# Patient Record
Sex: Female | Born: 1945 | Race: White | Hispanic: No | Marital: Married | Smoking: Never smoker
Health system: Southern US, Community
[De-identification: ages and names within clinical notes are randomized; demographics above are authoritative.]

## PROBLEM LIST (undated history)

## (undated) DIAGNOSIS — Z7960 Long term (current) use of unspecified immunomodulators and immunosuppressants: Secondary | ICD-10-CM

## (undated) DIAGNOSIS — E559 Vitamin D deficiency, unspecified: Secondary | ICD-10-CM

## (undated) DIAGNOSIS — I495 Sick sinus syndrome: Secondary | ICD-10-CM

## (undated) DIAGNOSIS — K219 Gastro-esophageal reflux disease without esophagitis: Secondary | ICD-10-CM

## (undated) DIAGNOSIS — M1990 Unspecified osteoarthritis, unspecified site: Secondary | ICD-10-CM

## (undated) DIAGNOSIS — E538 Deficiency of other specified B group vitamins: Secondary | ICD-10-CM

## (undated) DIAGNOSIS — Z79899 Other long term (current) drug therapy: Secondary | ICD-10-CM

## (undated) DIAGNOSIS — I447 Left bundle-branch block, unspecified: Secondary | ICD-10-CM

## (undated) DIAGNOSIS — M349 Systemic sclerosis, unspecified: Secondary | ICD-10-CM

## (undated) DIAGNOSIS — I350 Nonrheumatic aortic (valve) stenosis: Secondary | ICD-10-CM

## (undated) DIAGNOSIS — E039 Hypothyroidism, unspecified: Secondary | ICD-10-CM

## (undated) DIAGNOSIS — R011 Cardiac murmur, unspecified: Secondary | ICD-10-CM

## (undated) DIAGNOSIS — I272 Pulmonary hypertension, unspecified: Secondary | ICD-10-CM

## (undated) DIAGNOSIS — I7300 Raynaud's syndrome without gangrene: Secondary | ICD-10-CM

## (undated) DIAGNOSIS — I499 Cardiac arrhythmia, unspecified: Secondary | ICD-10-CM

## (undated) DIAGNOSIS — D126 Benign neoplasm of colon, unspecified: Secondary | ICD-10-CM

## (undated) DIAGNOSIS — I10 Essential (primary) hypertension: Secondary | ICD-10-CM

## (undated) DIAGNOSIS — D649 Anemia, unspecified: Secondary | ICD-10-CM

## (undated) DIAGNOSIS — Z950 Presence of cardiac pacemaker: Secondary | ICD-10-CM

## (undated) HISTORY — PX: ABDOMINAL HYSTERECTOMY: SHX81

---

## 1977-11-17 HISTORY — PX: TUBAL LIGATION: SHX77

## 2001-12-20 HISTORY — PX: COLONOSCOPY: SHX5424

## 2004-07-12 HISTORY — PX: COLONOSCOPY: SHX174

## 2005-01-27 ENCOUNTER — Ambulatory Visit: Payer: Self-pay | Admitting: Internal Medicine

## 2006-01-29 ENCOUNTER — Ambulatory Visit: Payer: Self-pay | Admitting: Internal Medicine

## 2007-01-28 ENCOUNTER — Ambulatory Visit: Payer: Self-pay | Admitting: Internal Medicine

## 2007-08-11 ENCOUNTER — Ambulatory Visit: Payer: Self-pay | Admitting: Unknown Physician Specialty

## 2007-08-11 HISTORY — PX: ESOPHAGOGASTRODUODENOSCOPY: SHX1529

## 2008-03-01 ENCOUNTER — Ambulatory Visit: Payer: Self-pay | Admitting: Internal Medicine

## 2009-04-10 ENCOUNTER — Ambulatory Visit: Payer: Self-pay | Admitting: Internal Medicine

## 2010-01-26 ENCOUNTER — Emergency Department: Payer: Self-pay | Admitting: Emergency Medicine

## 2010-03-07 ENCOUNTER — Inpatient Hospital Stay: Payer: Self-pay | Admitting: Internal Medicine

## 2010-05-06 ENCOUNTER — Ambulatory Visit: Payer: Self-pay | Admitting: Internal Medicine

## 2010-10-17 DIAGNOSIS — I442 Atrioventricular block, complete: Secondary | ICD-10-CM

## 2010-10-17 DIAGNOSIS — I495 Sick sinus syndrome: Secondary | ICD-10-CM

## 2010-10-17 DIAGNOSIS — Z950 Presence of cardiac pacemaker: Secondary | ICD-10-CM

## 2010-10-17 HISTORY — PX: PACEMAKER INSERTION: SHX728

## 2010-10-17 HISTORY — DX: Sick sinus syndrome: I49.5

## 2010-10-17 HISTORY — DX: Presence of cardiac pacemaker: Z95.0

## 2010-10-17 HISTORY — DX: Atrioventricular block, complete: I44.2

## 2010-10-29 ENCOUNTER — Inpatient Hospital Stay: Payer: Self-pay | Admitting: Internal Medicine

## 2011-05-08 ENCOUNTER — Ambulatory Visit: Payer: Self-pay | Admitting: Internal Medicine

## 2011-05-11 IMAGING — CT CT HEAD WITHOUT CONTRAST
2 series · 16 of 30 positions shown, 20 images · non-contrast
Comparison: none

REASON FOR EXAM: Severe HA with elevated BP
COMMENTS:   LMP: Post-Menopausal

[Series 2: without · axial · non-contrast · 0.44mm/px · z∈[-156,-36]mm · 13 of 30 slices shown, 17 images]
[im 3/30  brain]
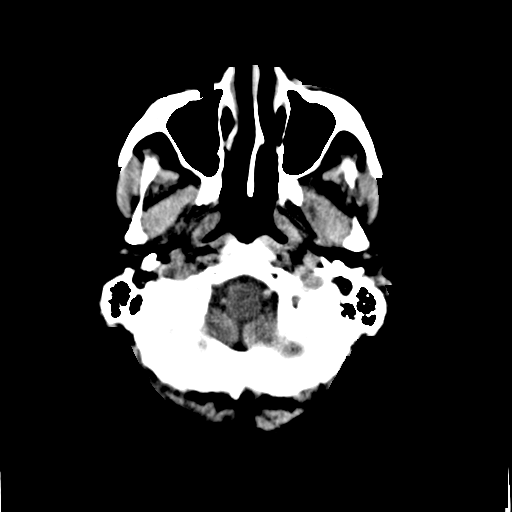
[im 3/30  bone]
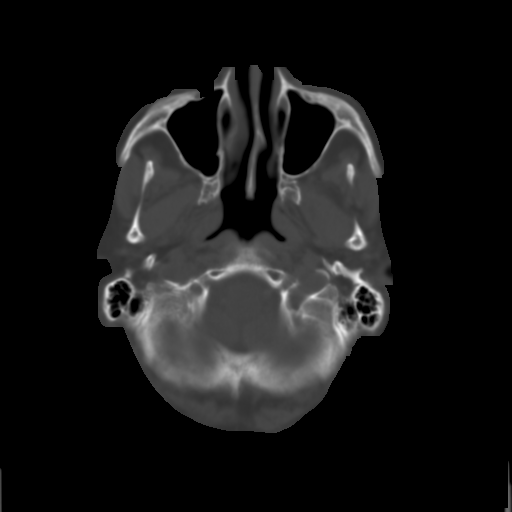
[im 5/30  brain]
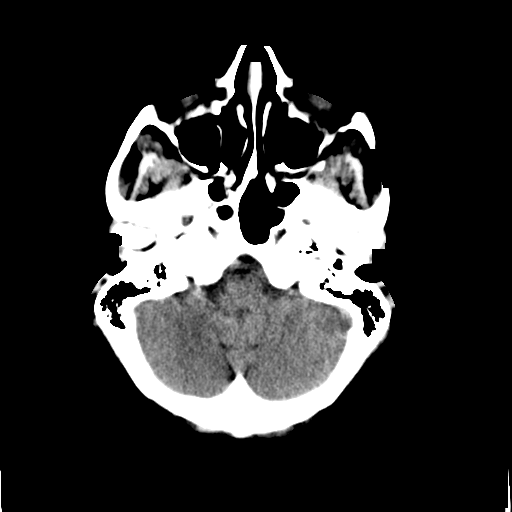
[im 7/30  brain]
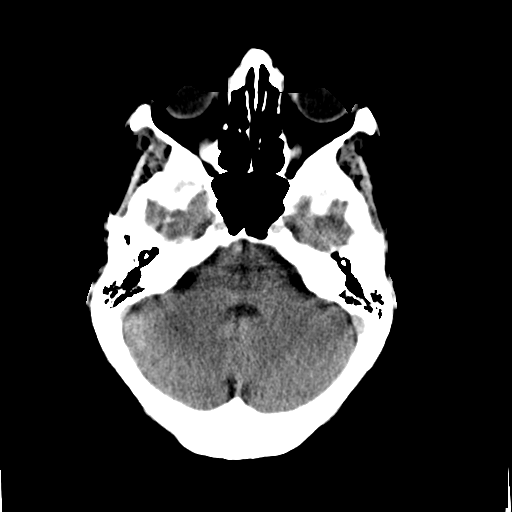
[im 9/30  brain]
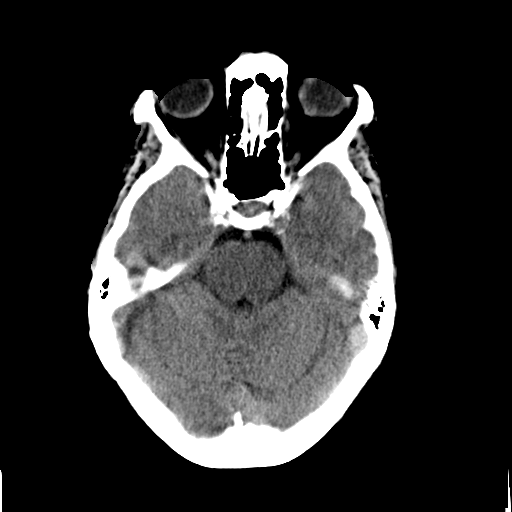
[im 11/30  brain]
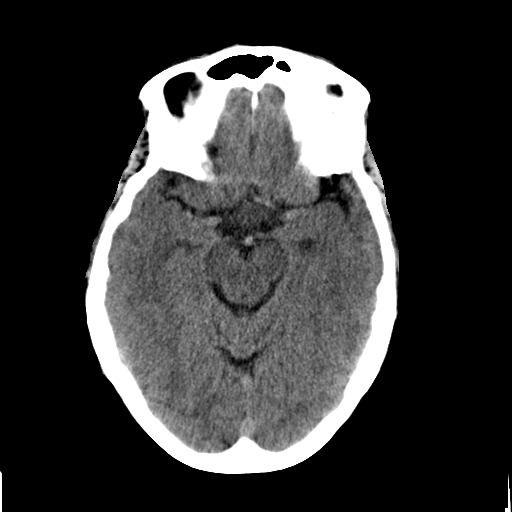
[im 11/30  bone]
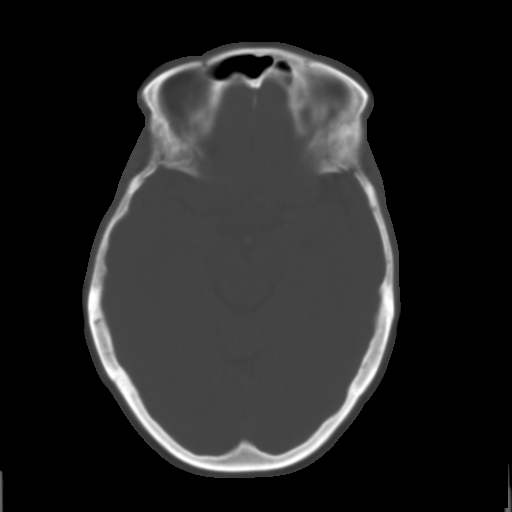
[im 13/30  brain]
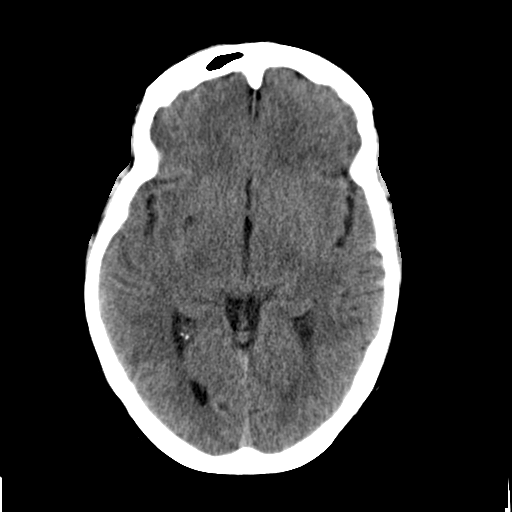
[im 15/30  brain]
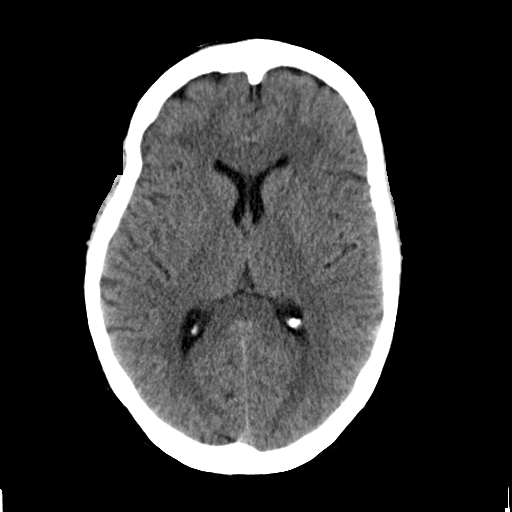
[im 17/30  brain]
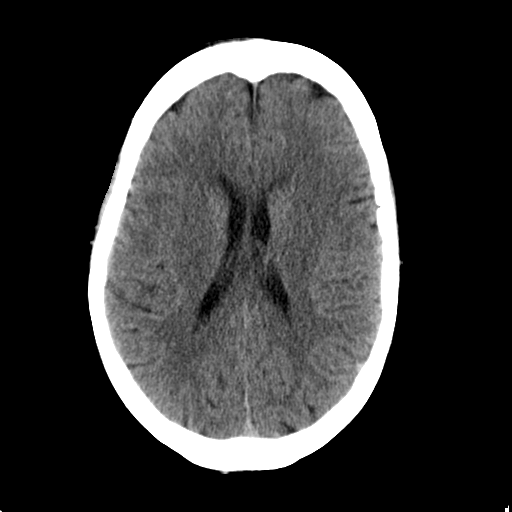
[im 19/30  brain]
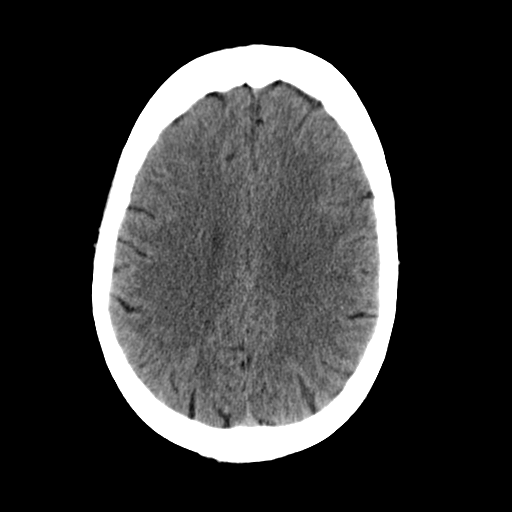
[im 19/30  bone]
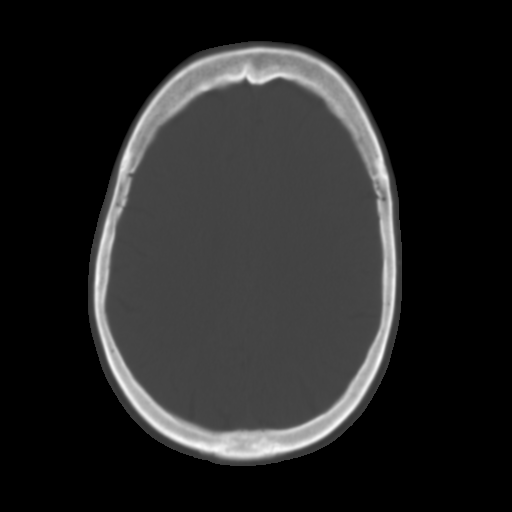
[im 21/30  brain]
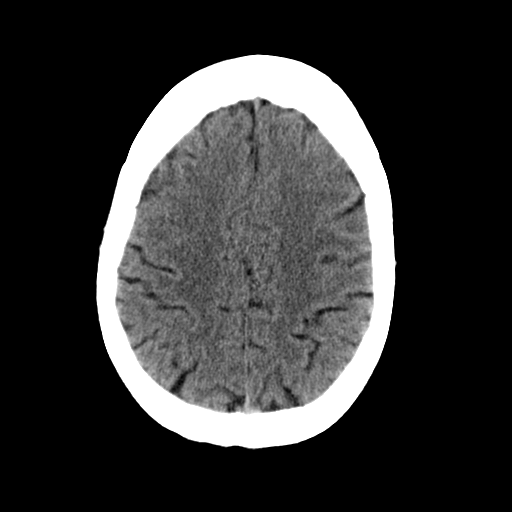
[im 23/30  brain]
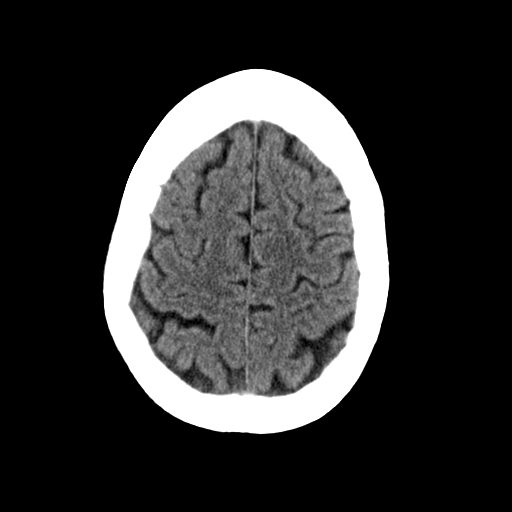
[im 25/30  brain]
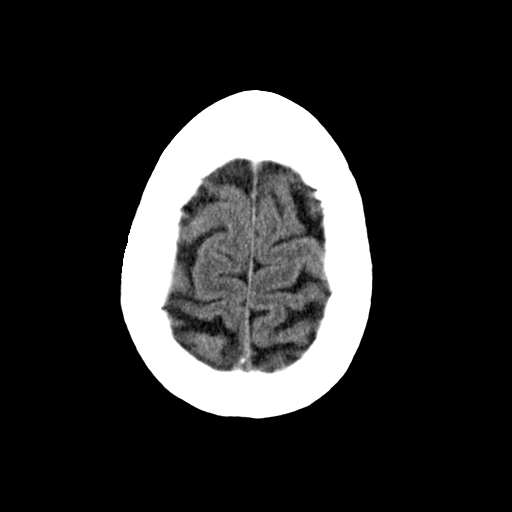
[im 27/30  brain]
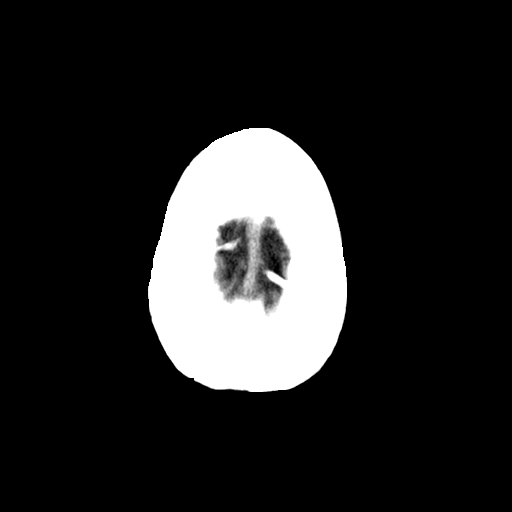
[im 27/30  bone]
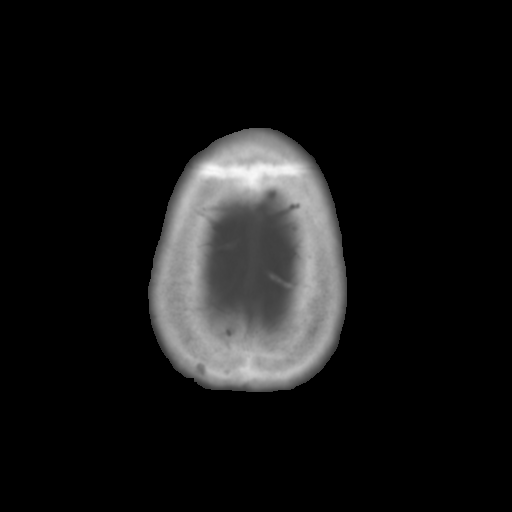

[Series 3: bone · axial · 0.44mm/px · z∈[-156,-116]mm · 3 of 30 slices shown]
[im 3/30  bone]
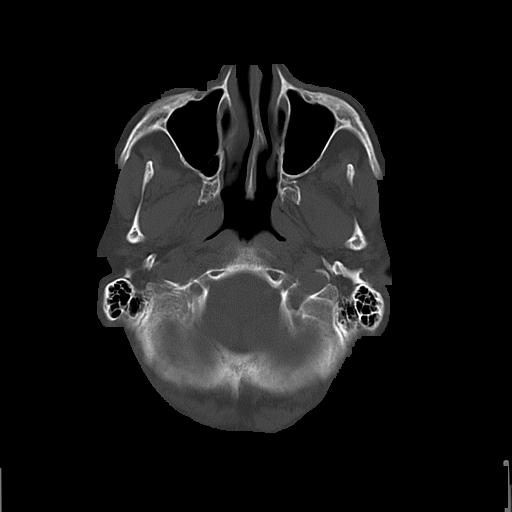
[im 7/30  bone]
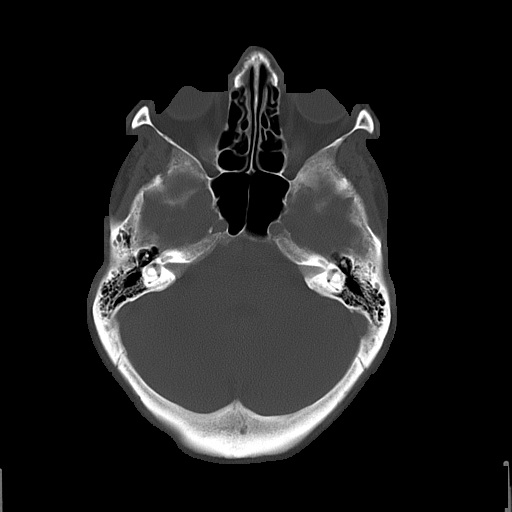
[im 11/30  bone]
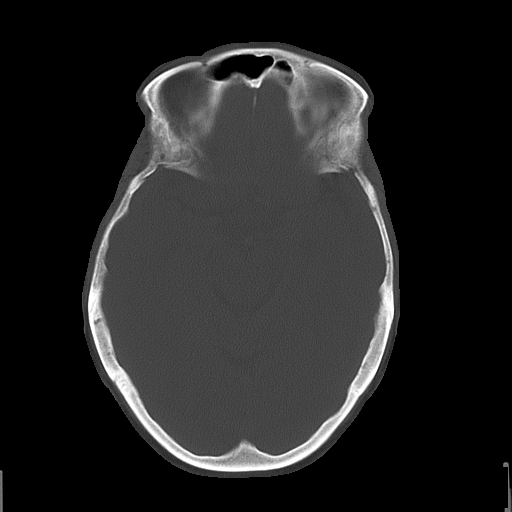

[16 of 30 positions shown; findings below may reference images not displayed]

PROCEDURE:     CT  - CT HEAD WITHOUT CONTRAST  - March 07, 2010  [DATE]

RESULT:     A noncontrast emergent CT of the brain is performed in the
standard fashion. The patient has no previous exam for comparison.

The ventricles and sulci are normal. There is no hemorrhage. There is no
focal mass, mass-effect or midline shift. There is no evidence of edema or
territorial infarct. The bone windows demonstrate normal aeration of the
paranasal sinuses and mastoid air cells. There is no skull fracture
demonstrated.
IMPRESSION: 1. No acute intracranial abnormality.

## 2011-07-04 ENCOUNTER — Ambulatory Visit: Payer: Self-pay | Admitting: Unknown Physician Specialty

## 2011-07-04 HISTORY — PX: COLONOSCOPY: SHX174

## 2012-05-11 ENCOUNTER — Ambulatory Visit: Payer: Self-pay | Admitting: Internal Medicine

## 2012-06-25 ENCOUNTER — Ambulatory Visit: Payer: Self-pay | Admitting: Rheumatology

## 2013-05-30 ENCOUNTER — Ambulatory Visit: Payer: Self-pay | Admitting: Family Medicine

## 2014-06-01 ENCOUNTER — Ambulatory Visit: Payer: Self-pay | Admitting: Family Medicine

## 2014-06-23 DIAGNOSIS — M349 Systemic sclerosis, unspecified: Secondary | ICD-10-CM | POA: Insufficient documentation

## 2014-12-05 DIAGNOSIS — I495 Sick sinus syndrome: Secondary | ICD-10-CM | POA: Diagnosis not present

## 2014-12-07 DIAGNOSIS — M79641 Pain in right hand: Secondary | ICD-10-CM | POA: Diagnosis not present

## 2014-12-07 DIAGNOSIS — M349 Systemic sclerosis, unspecified: Secondary | ICD-10-CM | POA: Diagnosis not present

## 2014-12-20 DIAGNOSIS — M349 Systemic sclerosis, unspecified: Secondary | ICD-10-CM | POA: Diagnosis not present

## 2014-12-27 DIAGNOSIS — M349 Systemic sclerosis, unspecified: Secondary | ICD-10-CM | POA: Diagnosis not present

## 2014-12-27 DIAGNOSIS — I7300 Raynaud's syndrome without gangrene: Secondary | ICD-10-CM | POA: Diagnosis not present

## 2014-12-27 DIAGNOSIS — Z79899 Other long term (current) drug therapy: Secondary | ICD-10-CM | POA: Diagnosis not present

## 2015-01-11 DIAGNOSIS — I10 Essential (primary) hypertension: Secondary | ICD-10-CM | POA: Diagnosis not present

## 2015-01-11 DIAGNOSIS — I495 Sick sinus syndrome: Secondary | ICD-10-CM | POA: Diagnosis not present

## 2015-01-11 DIAGNOSIS — R9431 Abnormal electrocardiogram [ECG] [EKG]: Secondary | ICD-10-CM | POA: Diagnosis not present

## 2015-01-11 DIAGNOSIS — M349 Systemic sclerosis, unspecified: Secondary | ICD-10-CM | POA: Diagnosis not present

## 2015-01-24 DIAGNOSIS — E039 Hypothyroidism, unspecified: Secondary | ICD-10-CM | POA: Diagnosis not present

## 2015-01-24 DIAGNOSIS — E559 Vitamin D deficiency, unspecified: Secondary | ICD-10-CM | POA: Diagnosis not present

## 2015-01-24 DIAGNOSIS — I10 Essential (primary) hypertension: Secondary | ICD-10-CM | POA: Diagnosis not present

## 2015-01-24 DIAGNOSIS — E538 Deficiency of other specified B group vitamins: Secondary | ICD-10-CM | POA: Diagnosis not present

## 2015-01-30 DIAGNOSIS — R0602 Shortness of breath: Secondary | ICD-10-CM | POA: Diagnosis not present

## 2015-01-30 DIAGNOSIS — I279 Pulmonary heart disease, unspecified: Secondary | ICD-10-CM | POA: Diagnosis not present

## 2015-01-31 DIAGNOSIS — E559 Vitamin D deficiency, unspecified: Secondary | ICD-10-CM | POA: Diagnosis not present

## 2015-01-31 DIAGNOSIS — E538 Deficiency of other specified B group vitamins: Secondary | ICD-10-CM | POA: Diagnosis not present

## 2015-01-31 DIAGNOSIS — E039 Hypothyroidism, unspecified: Secondary | ICD-10-CM | POA: Diagnosis not present

## 2015-01-31 DIAGNOSIS — I10 Essential (primary) hypertension: Secondary | ICD-10-CM | POA: Diagnosis not present

## 2015-02-13 DIAGNOSIS — B351 Tinea unguium: Secondary | ICD-10-CM | POA: Diagnosis not present

## 2015-02-13 DIAGNOSIS — M79671 Pain in right foot: Secondary | ICD-10-CM | POA: Diagnosis not present

## 2015-02-13 DIAGNOSIS — M79672 Pain in left foot: Secondary | ICD-10-CM | POA: Diagnosis not present

## 2015-03-27 DIAGNOSIS — I7300 Raynaud's syndrome without gangrene: Secondary | ICD-10-CM | POA: Diagnosis not present

## 2015-03-27 DIAGNOSIS — Z79899 Other long term (current) drug therapy: Secondary | ICD-10-CM | POA: Diagnosis not present

## 2015-03-27 DIAGNOSIS — M349 Systemic sclerosis, unspecified: Secondary | ICD-10-CM | POA: Diagnosis not present

## 2015-05-28 DIAGNOSIS — M79675 Pain in left toe(s): Secondary | ICD-10-CM | POA: Diagnosis not present

## 2015-05-28 DIAGNOSIS — B351 Tinea unguium: Secondary | ICD-10-CM | POA: Diagnosis not present

## 2015-05-28 DIAGNOSIS — M79674 Pain in right toe(s): Secondary | ICD-10-CM | POA: Diagnosis not present

## 2015-06-19 DIAGNOSIS — M349 Systemic sclerosis, unspecified: Secondary | ICD-10-CM | POA: Diagnosis not present

## 2015-06-19 DIAGNOSIS — Z79899 Other long term (current) drug therapy: Secondary | ICD-10-CM | POA: Diagnosis not present

## 2015-06-19 DIAGNOSIS — R001 Bradycardia, unspecified: Secondary | ICD-10-CM | POA: Diagnosis not present

## 2015-06-19 DIAGNOSIS — I442 Atrioventricular block, complete: Secondary | ICD-10-CM | POA: Diagnosis not present

## 2015-06-19 DIAGNOSIS — I7300 Raynaud's syndrome without gangrene: Secondary | ICD-10-CM | POA: Diagnosis not present

## 2015-06-19 DIAGNOSIS — I495 Sick sinus syndrome: Secondary | ICD-10-CM | POA: Diagnosis not present

## 2015-06-27 DIAGNOSIS — I7300 Raynaud's syndrome without gangrene: Secondary | ICD-10-CM | POA: Diagnosis not present

## 2015-06-27 DIAGNOSIS — M349 Systemic sclerosis, unspecified: Secondary | ICD-10-CM | POA: Diagnosis not present

## 2015-06-27 DIAGNOSIS — I279 Pulmonary heart disease, unspecified: Secondary | ICD-10-CM | POA: Diagnosis not present

## 2015-07-18 DIAGNOSIS — E039 Hypothyroidism, unspecified: Secondary | ICD-10-CM | POA: Diagnosis not present

## 2015-07-18 DIAGNOSIS — I10 Essential (primary) hypertension: Secondary | ICD-10-CM | POA: Diagnosis not present

## 2015-07-18 DIAGNOSIS — E538 Deficiency of other specified B group vitamins: Secondary | ICD-10-CM | POA: Diagnosis not present

## 2015-07-25 ENCOUNTER — Other Ambulatory Visit: Payer: Self-pay | Admitting: Family Medicine

## 2015-07-25 DIAGNOSIS — I10 Essential (primary) hypertension: Secondary | ICD-10-CM | POA: Diagnosis not present

## 2015-07-25 DIAGNOSIS — Z1231 Encounter for screening mammogram for malignant neoplasm of breast: Secondary | ICD-10-CM

## 2015-07-25 DIAGNOSIS — E039 Hypothyroidism, unspecified: Secondary | ICD-10-CM | POA: Diagnosis not present

## 2015-07-25 DIAGNOSIS — E559 Vitamin D deficiency, unspecified: Secondary | ICD-10-CM | POA: Diagnosis not present

## 2015-07-25 DIAGNOSIS — E538 Deficiency of other specified B group vitamins: Secondary | ICD-10-CM | POA: Diagnosis not present

## 2015-07-31 DIAGNOSIS — E559 Vitamin D deficiency, unspecified: Secondary | ICD-10-CM | POA: Diagnosis not present

## 2015-08-02 ENCOUNTER — Ambulatory Visit
Admission: RE | Admit: 2015-08-02 | Discharge: 2015-08-02 | Disposition: A | Discharge status: Home / Self Care | Payer: Commercial Managed Care - HMO | Source: Ambulatory Visit | Attending: Family Medicine | Admitting: Family Medicine

## 2015-08-02 ENCOUNTER — Other Ambulatory Visit: Payer: Self-pay | Admitting: Family Medicine

## 2015-08-02 DIAGNOSIS — Z1231 Encounter for screening mammogram for malignant neoplasm of breast: Secondary | ICD-10-CM | POA: Diagnosis not present

## 2015-09-25 DIAGNOSIS — M349 Systemic sclerosis, unspecified: Secondary | ICD-10-CM | POA: Diagnosis not present

## 2015-09-25 DIAGNOSIS — I7300 Raynaud's syndrome without gangrene: Secondary | ICD-10-CM | POA: Diagnosis not present

## 2015-10-22 DIAGNOSIS — M79675 Pain in left toe(s): Secondary | ICD-10-CM | POA: Diagnosis not present

## 2015-10-22 DIAGNOSIS — M79674 Pain in right toe(s): Secondary | ICD-10-CM | POA: Diagnosis not present

## 2015-10-22 DIAGNOSIS — B351 Tinea unguium: Secondary | ICD-10-CM | POA: Diagnosis not present

## 2015-11-20 DIAGNOSIS — I10 Essential (primary) hypertension: Secondary | ICD-10-CM | POA: Diagnosis not present

## 2015-11-30 DIAGNOSIS — L03818 Cellulitis of other sites: Secondary | ICD-10-CM | POA: Diagnosis not present

## 2015-11-30 DIAGNOSIS — E538 Deficiency of other specified B group vitamins: Secondary | ICD-10-CM | POA: Diagnosis not present

## 2015-11-30 DIAGNOSIS — I10 Essential (primary) hypertension: Secondary | ICD-10-CM | POA: Diagnosis not present

## 2015-11-30 DIAGNOSIS — Z0000 Encounter for general adult medical examination without abnormal findings: Secondary | ICD-10-CM | POA: Diagnosis not present

## 2015-11-30 DIAGNOSIS — E559 Vitamin D deficiency, unspecified: Secondary | ICD-10-CM | POA: Diagnosis not present

## 2015-11-30 DIAGNOSIS — E039 Hypothyroidism, unspecified: Secondary | ICD-10-CM | POA: Diagnosis not present

## 2015-12-26 DIAGNOSIS — M349 Systemic sclerosis, unspecified: Secondary | ICD-10-CM | POA: Diagnosis not present

## 2015-12-31 DIAGNOSIS — M79675 Pain in left toe(s): Secondary | ICD-10-CM | POA: Diagnosis not present

## 2015-12-31 DIAGNOSIS — M79674 Pain in right toe(s): Secondary | ICD-10-CM | POA: Diagnosis not present

## 2015-12-31 DIAGNOSIS — B351 Tinea unguium: Secondary | ICD-10-CM | POA: Diagnosis not present

## 2016-01-02 DIAGNOSIS — I7300 Raynaud's syndrome without gangrene: Secondary | ICD-10-CM | POA: Diagnosis not present

## 2016-01-02 DIAGNOSIS — M349 Systemic sclerosis, unspecified: Secondary | ICD-10-CM | POA: Diagnosis not present

## 2016-01-02 DIAGNOSIS — M79641 Pain in right hand: Secondary | ICD-10-CM | POA: Diagnosis not present

## 2016-01-03 DIAGNOSIS — R011 Cardiac murmur, unspecified: Secondary | ICD-10-CM | POA: Diagnosis not present

## 2016-01-03 DIAGNOSIS — I495 Sick sinus syndrome: Secondary | ICD-10-CM | POA: Diagnosis not present

## 2016-01-03 DIAGNOSIS — I7300 Raynaud's syndrome without gangrene: Secondary | ICD-10-CM | POA: Diagnosis not present

## 2016-01-03 DIAGNOSIS — E079 Disorder of thyroid, unspecified: Secondary | ICD-10-CM | POA: Diagnosis not present

## 2016-01-03 DIAGNOSIS — I442 Atrioventricular block, complete: Secondary | ICD-10-CM | POA: Diagnosis not present

## 2016-01-03 DIAGNOSIS — R9431 Abnormal electrocardiogram [ECG] [EKG]: Secondary | ICD-10-CM | POA: Diagnosis not present

## 2016-01-03 DIAGNOSIS — M349 Systemic sclerosis, unspecified: Secondary | ICD-10-CM | POA: Diagnosis not present

## 2016-01-03 DIAGNOSIS — R001 Bradycardia, unspecified: Secondary | ICD-10-CM | POA: Diagnosis not present

## 2016-01-03 DIAGNOSIS — K219 Gastro-esophageal reflux disease without esophagitis: Secondary | ICD-10-CM | POA: Diagnosis not present

## 2016-01-30 DIAGNOSIS — M3481 Systemic sclerosis with lung involvement: Secondary | ICD-10-CM | POA: Diagnosis not present

## 2016-01-30 DIAGNOSIS — I279 Pulmonary heart disease, unspecified: Secondary | ICD-10-CM | POA: Diagnosis not present

## 2016-01-30 DIAGNOSIS — I2781 Cor pulmonale (chronic): Secondary | ICD-10-CM | POA: Diagnosis not present

## 2016-01-30 DIAGNOSIS — Z8739 Personal history of other diseases of the musculoskeletal system and connective tissue: Secondary | ICD-10-CM | POA: Diagnosis not present

## 2016-02-27 DIAGNOSIS — I2789 Other specified pulmonary heart diseases: Secondary | ICD-10-CM | POA: Diagnosis not present

## 2016-02-27 DIAGNOSIS — I5189 Other ill-defined heart diseases: Secondary | ICD-10-CM

## 2016-02-27 DIAGNOSIS — I279 Pulmonary heart disease, unspecified: Secondary | ICD-10-CM | POA: Diagnosis not present

## 2016-02-27 DIAGNOSIS — I272 Pulmonary hypertension, unspecified: Secondary | ICD-10-CM

## 2016-02-27 HISTORY — DX: Other ill-defined heart diseases: I51.89

## 2016-02-27 HISTORY — DX: Pulmonary hypertension, unspecified: I27.20

## 2016-03-10 DIAGNOSIS — B351 Tinea unguium: Secondary | ICD-10-CM | POA: Diagnosis not present

## 2016-03-10 DIAGNOSIS — M79674 Pain in right toe(s): Secondary | ICD-10-CM | POA: Diagnosis not present

## 2016-03-10 DIAGNOSIS — M79675 Pain in left toe(s): Secondary | ICD-10-CM | POA: Diagnosis not present

## 2016-03-25 DIAGNOSIS — E559 Vitamin D deficiency, unspecified: Secondary | ICD-10-CM | POA: Diagnosis not present

## 2016-03-25 DIAGNOSIS — I10 Essential (primary) hypertension: Secondary | ICD-10-CM | POA: Diagnosis not present

## 2016-03-25 DIAGNOSIS — E039 Hypothyroidism, unspecified: Secondary | ICD-10-CM | POA: Diagnosis not present

## 2016-03-25 DIAGNOSIS — E538 Deficiency of other specified B group vitamins: Secondary | ICD-10-CM | POA: Diagnosis not present

## 2016-04-01 DIAGNOSIS — E039 Hypothyroidism, unspecified: Secondary | ICD-10-CM | POA: Diagnosis not present

## 2016-04-01 DIAGNOSIS — E559 Vitamin D deficiency, unspecified: Secondary | ICD-10-CM | POA: Diagnosis not present

## 2016-04-01 DIAGNOSIS — M349 Systemic sclerosis, unspecified: Secondary | ICD-10-CM | POA: Diagnosis not present

## 2016-04-01 DIAGNOSIS — I7300 Raynaud's syndrome without gangrene: Secondary | ICD-10-CM | POA: Diagnosis not present

## 2016-04-01 DIAGNOSIS — I10 Essential (primary) hypertension: Secondary | ICD-10-CM | POA: Diagnosis not present

## 2016-04-01 DIAGNOSIS — E538 Deficiency of other specified B group vitamins: Secondary | ICD-10-CM | POA: Diagnosis not present

## 2016-05-19 ENCOUNTER — Ambulatory Visit: Payer: Commercial Managed Care - HMO

## 2016-05-21 DIAGNOSIS — M79674 Pain in right toe(s): Secondary | ICD-10-CM | POA: Diagnosis not present

## 2016-05-21 DIAGNOSIS — M79675 Pain in left toe(s): Secondary | ICD-10-CM | POA: Diagnosis not present

## 2016-05-21 DIAGNOSIS — B351 Tinea unguium: Secondary | ICD-10-CM | POA: Diagnosis not present

## 2016-06-04 DIAGNOSIS — M25522 Pain in left elbow: Secondary | ICD-10-CM | POA: Diagnosis not present

## 2016-06-04 DIAGNOSIS — I7300 Raynaud's syndrome without gangrene: Secondary | ICD-10-CM | POA: Diagnosis not present

## 2016-06-04 DIAGNOSIS — Z79899 Other long term (current) drug therapy: Secondary | ICD-10-CM | POA: Diagnosis not present

## 2016-06-04 DIAGNOSIS — M79641 Pain in right hand: Secondary | ICD-10-CM | POA: Diagnosis not present

## 2016-06-04 DIAGNOSIS — M349 Systemic sclerosis, unspecified: Secondary | ICD-10-CM | POA: Diagnosis not present

## 2016-06-26 DIAGNOSIS — M79642 Pain in left hand: Secondary | ICD-10-CM | POA: Diagnosis not present

## 2016-06-26 DIAGNOSIS — M349 Systemic sclerosis, unspecified: Secondary | ICD-10-CM | POA: Diagnosis not present

## 2016-06-26 DIAGNOSIS — I7300 Raynaud's syndrome without gangrene: Secondary | ICD-10-CM | POA: Diagnosis not present

## 2016-07-01 DIAGNOSIS — I495 Sick sinus syndrome: Secondary | ICD-10-CM | POA: Diagnosis not present

## 2016-07-03 DIAGNOSIS — I442 Atrioventricular block, complete: Secondary | ICD-10-CM | POA: Diagnosis not present

## 2016-07-03 DIAGNOSIS — I7300 Raynaud's syndrome without gangrene: Secondary | ICD-10-CM | POA: Diagnosis not present

## 2016-07-03 DIAGNOSIS — M349 Systemic sclerosis, unspecified: Secondary | ICD-10-CM | POA: Diagnosis not present

## 2016-07-03 DIAGNOSIS — K219 Gastro-esophageal reflux disease without esophagitis: Secondary | ICD-10-CM | POA: Diagnosis not present

## 2016-07-03 DIAGNOSIS — R9431 Abnormal electrocardiogram [ECG] [EKG]: Secondary | ICD-10-CM | POA: Diagnosis not present

## 2016-07-03 DIAGNOSIS — R011 Cardiac murmur, unspecified: Secondary | ICD-10-CM | POA: Diagnosis not present

## 2016-07-03 DIAGNOSIS — E079 Disorder of thyroid, unspecified: Secondary | ICD-10-CM | POA: Diagnosis not present

## 2016-07-03 DIAGNOSIS — R001 Bradycardia, unspecified: Secondary | ICD-10-CM | POA: Diagnosis not present

## 2016-07-03 DIAGNOSIS — I495 Sick sinus syndrome: Secondary | ICD-10-CM | POA: Diagnosis not present

## 2016-07-11 DIAGNOSIS — Z86010 Personal history of colonic polyps: Secondary | ICD-10-CM | POA: Diagnosis not present

## 2016-07-30 DIAGNOSIS — M79674 Pain in right toe(s): Secondary | ICD-10-CM | POA: Diagnosis not present

## 2016-07-30 DIAGNOSIS — B351 Tinea unguium: Secondary | ICD-10-CM | POA: Diagnosis not present

## 2016-07-30 DIAGNOSIS — M79675 Pain in left toe(s): Secondary | ICD-10-CM | POA: Diagnosis not present

## 2016-09-04 DIAGNOSIS — I7300 Raynaud's syndrome without gangrene: Secondary | ICD-10-CM | POA: Diagnosis not present

## 2016-09-04 DIAGNOSIS — Z79899 Other long term (current) drug therapy: Secondary | ICD-10-CM | POA: Diagnosis not present

## 2016-09-10 ENCOUNTER — Other Ambulatory Visit: Payer: Self-pay | Admitting: Family Medicine

## 2016-09-10 DIAGNOSIS — Z1231 Encounter for screening mammogram for malignant neoplasm of breast: Secondary | ICD-10-CM

## 2016-09-19 ENCOUNTER — Encounter: Payer: Self-pay | Admitting: *Deleted

## 2016-09-22 ENCOUNTER — Ambulatory Visit: Payer: Commercial Managed Care - HMO | Admitting: Anesthesiology

## 2016-09-22 ENCOUNTER — Encounter
Admission: RE | Disposition: A | Discharge status: Home / Self Care | Payer: Self-pay | Source: Ambulatory Visit | Attending: Unknown Physician Specialty

## 2016-09-22 ENCOUNTER — Ambulatory Visit
Admission: RE | Admit: 2016-09-22 | Discharge: 2016-09-22 | Disposition: A | Discharge status: Home / Self Care | Payer: Commercial Managed Care - HMO | Source: Ambulatory Visit | Attending: Unknown Physician Specialty | Admitting: Unknown Physician Specialty

## 2016-09-22 DIAGNOSIS — Z79899 Other long term (current) drug therapy: Secondary | ICD-10-CM | POA: Insufficient documentation

## 2016-09-22 DIAGNOSIS — E538 Deficiency of other specified B group vitamins: Secondary | ICD-10-CM | POA: Diagnosis not present

## 2016-09-22 DIAGNOSIS — E039 Hypothyroidism, unspecified: Secondary | ICD-10-CM | POA: Diagnosis not present

## 2016-09-22 DIAGNOSIS — K640 First degree hemorrhoids: Secondary | ICD-10-CM | POA: Insufficient documentation

## 2016-09-22 DIAGNOSIS — Z950 Presence of cardiac pacemaker: Secondary | ICD-10-CM | POA: Diagnosis not present

## 2016-09-22 DIAGNOSIS — K219 Gastro-esophageal reflux disease without esophagitis: Secondary | ICD-10-CM | POA: Insufficient documentation

## 2016-09-22 DIAGNOSIS — M349 Systemic sclerosis, unspecified: Secondary | ICD-10-CM | POA: Diagnosis not present

## 2016-09-22 DIAGNOSIS — D649 Anemia, unspecified: Secondary | ICD-10-CM | POA: Insufficient documentation

## 2016-09-22 DIAGNOSIS — Z1211 Encounter for screening for malignant neoplasm of colon: Secondary | ICD-10-CM | POA: Insufficient documentation

## 2016-09-22 DIAGNOSIS — Z7982 Long term (current) use of aspirin: Secondary | ICD-10-CM | POA: Insufficient documentation

## 2016-09-22 DIAGNOSIS — I10 Essential (primary) hypertension: Secondary | ICD-10-CM | POA: Insufficient documentation

## 2016-09-22 DIAGNOSIS — Z86010 Personal history of colonic polyps: Secondary | ICD-10-CM | POA: Diagnosis not present

## 2016-09-22 DIAGNOSIS — I4891 Unspecified atrial fibrillation: Secondary | ICD-10-CM | POA: Insufficient documentation

## 2016-09-22 HISTORY — DX: Systemic sclerosis, unspecified: M34.9

## 2016-09-22 HISTORY — DX: Cardiac arrhythmia, unspecified: I49.9

## 2016-09-22 HISTORY — DX: Gastro-esophageal reflux disease without esophagitis: K21.9

## 2016-09-22 HISTORY — DX: Deficiency of other specified B group vitamins: E53.8

## 2016-09-22 HISTORY — DX: Essential (primary) hypertension: I10

## 2016-09-22 HISTORY — DX: Anemia, unspecified: D64.9

## 2016-09-22 HISTORY — DX: Hypothyroidism, unspecified: E03.9

## 2016-09-22 HISTORY — PX: COLONOSCOPY: SHX5424

## 2016-09-22 SURGERY — COLONOSCOPY
Anesthesia: General

## 2016-09-22 MED ORDER — PROPOFOL 10 MG/ML IV BOLUS
INTRAVENOUS | Status: DC | PRN
  Administered 2016-09-22: 50 mg via INTRAVENOUS
  Administered 2016-09-22 (×2): 20 mg via INTRAVENOUS

## 2016-09-22 MED ORDER — LIDOCAINE 2% (20 MG/ML) 5 ML SYRINGE
INTRAMUSCULAR | Status: DC | PRN
  Administered 2016-09-22: 50 mg via INTRAVENOUS

## 2016-09-22 MED ORDER — SODIUM CHLORIDE 0.9 % IV SOLN: INTRAVENOUS | Status: DC

## 2016-09-22 MED ORDER — SODIUM CHLORIDE 0.9 % IV SOLN
INTRAVENOUS | Status: DC
  Administered 2016-09-22: 11:00:00 via INTRAVENOUS

## 2016-09-22 MED ORDER — MIDAZOLAM HCL 5 MG/5ML IJ SOLN
INTRAMUSCULAR | Status: DC | PRN
  Administered 2016-09-22: 1 mg via INTRAVENOUS

## 2016-09-22 MED ORDER — PROPOFOL 500 MG/50ML IV EMUL
INTRAVENOUS | Status: DC | PRN
  Administered 2016-09-22: 120 ug/kg/min via INTRAVENOUS

## 2016-09-22 NOTE — Anesthesia Preprocedure Evaluation (Signed)
Anesthesia Evaluation  Patient identified by MRN, date of birth, ID band Patient awake    Reviewed: Allergy & Precautions, NPO status , Patient's Chart, lab work & pertinent test results  History of Anesthesia Complications Negative for: history of anesthetic complications  Airway Mallampati: II       Dental   Pulmonary neg pulmonary ROS,           Cardiovascular hypertension, Pt. on medications + dysrhythmias (aflutter, now with pacemaker) Atrial Fibrillation + pacemaker      Neuro/Psych negative neurological ROS     GI/Hepatic Neg liver ROS, GERD  Medicated and Controlled,  Endo/Other  Hypothyroidism   Renal/GU negative Renal ROS     Musculoskeletal   Abdominal   Peds  Hematology  (+) Blood dyscrasia (hx of anemia, now improved), anemia ,   Anesthesia Other Findings   Reproductive/Obstetrics                             Anesthesia Physical Anesthesia Plan  ASA: II  Anesthesia Plan: General   Post-op Pain Management:    Induction: Intravenous  Airway Management Planned:   Additional Equipment:   Intra-op Plan:   Post-operative Plan:   Informed Consent: I have reviewed the patients History and Physical, chart, labs and discussed the procedure including the risks, benefits and alternatives for the proposed anesthesia with the patient or authorized representative who has indicated his/her understanding and acceptance.     Plan Discussed with:   Anesthesia Plan Comments:         Anesthesia Quick Evaluation

## 2016-09-22 NOTE — Anesthesia Postprocedure Evaluation (Signed)
Anesthesia Post Note  Patient: [name]  Procedure(s) Performed: Procedure(s) (LRB): COLONOSCOPY (N/A)  Patient location during evaluation: Endoscopy Anesthesia Type: General Level of consciousness: awake and alert Pain management: pain level controlled Vital Signs Assessment: post-procedure vital signs reviewed and stable Respiratory status: spontaneous breathing and respiratory function stable Cardiovascular status: stable Anesthetic complications: no    Last Vitals:  Vitals:   09/22/16 1029 09/22/16 1154  BP: (!) 153/94 (!) 108/96  Pulse: 95 72  Resp: 16 18  Temp: 36.3 C (!) 36.1 C    Last Pain:  Vitals:   09/22/16 1029  TempSrc: Tympanic                 KEPHART,WILLIAM K

## 2016-09-22 NOTE — Transfer of Care (Signed)
Immediate Anesthesia Transfer of Care Note  Patient: [name]  Procedure(s) Performed: Procedure(s) with comments: COLONOSCOPY (N/A) - Pacemaker; Diffuse Scleroderma  Patient Location: Endoscopy Unit  Anesthesia Type:General  Level of Consciousness: awake  Airway & Oxygen Therapy: Patient Spontanous Breathing and Patient connected to nasal cannula oxygen  Post-op Assessment: Report given to RN and Post -op Vital signs reviewed and stable  Post vital signs: Reviewed  Last Vitals:  Vitals:   09/22/16 1029 09/22/16 1154  BP: (!) 153/94 (!) 108/96  Pulse: 95 72  Resp: 16 18  Temp: 36.3 C (!) 36.1 C    Last Pain:  Vitals:   09/22/16 1029  TempSrc: Tympanic         Complications: No apparent anesthesia complications

## 2016-09-22 NOTE — Op Note (Signed)
[facility] Gastroenterology Patient Name: [name] Procedure Date: 09/22/2016 11:17 AM MRN: [id number] Account #: [id number] Date of Birth: August 25, 1946 Admit Type: Outpatient Age: 70 Room: [facility] ENDO ROOM 4 Gender: Female Note Status: Finalized Procedure:            Colonoscopy Indications:          High risk colon cancer surveillance: Personal history                        of colonic polyps Providers:            Manya Silvas, MD Referring MD:         Dion Body (Referring MD) Medicines:            Propofol per Anesthesia Complications:        No immediate complications. Procedure:            Pre-Anesthesia Assessment:                       - After reviewing the risks and benefits, the patient                        was deemed in satisfactory condition to undergo the                        procedure.                       After obtaining informed consent, the colonoscope was                        passed under direct vision. Throughout the procedure,                        the patient's blood pressure, pulse, and oxygen                        saturations were monitored continuously. The                        Colonoscope was introduced through the anus and                        advanced to the the cecum, identified by appendiceal                        orifice and ileocecal valve. The colonoscopy was                        performed without difficulty. The patient tolerated the                        procedure well. The quality of the bowel preparation                        was excellent. Findings:      Internal hemorrhoids were found during endoscopy. The hemorrhoids were       small and Grade I (internal hemorrhoids that do not prolapse).      The exam was otherwise without abnormality. Impression:           - Internal hemorrhoids.                       -  The examination was otherwise normal.                       - No specimens  collected. Recommendation:       - The findings and recommendations were discussed with                        the patient's family. Repeat in 10 years. Manya Silvas, MD 09/22/2016 11:53:49 AM This report has been signed electronically. Number of Addenda: 0 Note Initiated On: 09/22/2016 11:17 AM Scope Withdrawal Time: 0 hours 9 minutes 33 seconds  Total Procedure Duration: 0 hours 19 minutes 5 seconds       [facility]

## 2016-09-22 NOTE — H&P (Signed)
Primary Care Physician:  Dion Body, MD Primary Gastroenterologist:  Dr. Vira Agar  Pre-Procedure History & Physical: HPI:  [name] is a 70 y.o. female is here for an colonoscopy.   Past Medical History:  Diagnosis Date  . Anemia   . Dysrhythmia   . GERD (gastroesophageal reflux disease)   . Hypertension   . Hypothyroidism   . Scleroderma ([place])   . Vitamin B 12 deficiency     No past surgical history on file.  Prior to Admission medications   Medication Sig Start Date End Date Taking? Authorizing Provider  amLODipine (NORVASC) 10 MG tablet Take 10 mg by mouth daily.   Yes Historical Provider, MD  aspirin EC 81 MG tablet Take 81 mg by mouth daily.   Yes Historical Provider, MD  Calcium 600-200 MG-UNIT tablet Take 2 tablets by mouth daily.   Yes Historical Provider, MD  cholecalciferol (VITAMIN D) 400 units TABS tablet Take 400 Units by mouth daily.   Yes Historical Provider, MD  Cyanocobalamin 1000 MCG/ML KIT Inject 1,000 mcg as directed every 30 (thirty) days.   Yes Historical Provider, MD  enalapril (VASOTEC) 2.5 MG tablet Take 2.5 mg by mouth daily.   Yes Historical Provider, MD  folic acid (FOLVITE) 1 MG tablet Take 1 mg by mouth daily.   Yes Historical Provider, MD  hydrALAZINE (APRESOLINE) 50 MG tablet Take 50 mg by mouth 3 (three) times daily.   Yes Historical Provider, MD  levothyroxine (SYNTHROID, LEVOTHROID) 75 MCG tablet Take 75 mcg by mouth daily before breakfast.   Yes Historical Provider, MD  methotrexate (RHEUMATREX) 2.5 MG tablet Take 7.5 mg by mouth daily. Take 6 tablets by mouth seven days a week.   Yes Historical Provider, MD  omeprazole (PRILOSEC) 20 MG capsule Take 20 mg by mouth daily.   Yes Historical Provider, MD    Allergies as of 08/04/2016  . (Not on File)    No family history on file.  Social History   Social History  . Marital status: Married    Spouse name: N/A  . Number of children: N/A  . Years of education: N/A    Occupational History  . Not on file.   Social History Main Topics  . Smoking status: Not on file  . Smokeless tobacco: Not on file  . Alcohol use Not on file  . Drug use: Unknown  . Sexual activity: Not on file   Other Topics Concern  . Not on file   Social History Narrative  . No narrative on file    Review of Systems: See HPI, otherwise negative ROS  Physical Exam: BP (!) 153/94   Pulse 95   Temp 97.3 F (36.3 C) (Tympanic)   Resp 16   Ht 5' 3.5" (1.613 m)   Wt 65.8 kg (145 lb)   SpO2 100%   BMI 25.28 kg/m  General:   Alert,  pleasant and cooperative in NAD Head:  Normocephalic and atraumatic. Neck:  Supple; no masses or thyromegaly. Lungs:  Clear throughout to auscultation.    Heart:  Regular rate and rhythm. Abdomen:  Soft, nontender and nondistended. Normal bowel sounds, without guarding, and without rebound.   Neurologic:  Alert and  oriented x4;  grossly normal neurologically.  Impression/Plan: [name] is here for an colonoscopy to be performed for [facility] colon polyps  Risks, benefits, limitations, and alternatives regarding  colonoscopy have been reviewed with the patient.  Questions have been answered.  All parties agreeable.  Gaylyn Cheers, MD  09/22/2016, 11:27 AM

## 2016-09-23 ENCOUNTER — Encounter: Payer: Self-pay | Admitting: Unknown Physician Specialty

## 2016-09-25 DIAGNOSIS — E559 Vitamin D deficiency, unspecified: Secondary | ICD-10-CM | POA: Diagnosis not present

## 2016-09-25 DIAGNOSIS — E538 Deficiency of other specified B group vitamins: Secondary | ICD-10-CM | POA: Diagnosis not present

## 2016-09-25 DIAGNOSIS — E039 Hypothyroidism, unspecified: Secondary | ICD-10-CM | POA: Diagnosis not present

## 2016-09-25 DIAGNOSIS — I10 Essential (primary) hypertension: Secondary | ICD-10-CM | POA: Diagnosis not present

## 2016-10-14 DIAGNOSIS — L03818 Cellulitis of other sites: Secondary | ICD-10-CM | POA: Diagnosis not present

## 2016-10-14 DIAGNOSIS — E538 Deficiency of other specified B group vitamins: Secondary | ICD-10-CM | POA: Diagnosis not present

## 2016-10-14 DIAGNOSIS — I10 Essential (primary) hypertension: Secondary | ICD-10-CM | POA: Diagnosis not present

## 2016-10-14 DIAGNOSIS — E039 Hypothyroidism, unspecified: Secondary | ICD-10-CM | POA: Diagnosis not present

## 2016-10-14 DIAGNOSIS — E559 Vitamin D deficiency, unspecified: Secondary | ICD-10-CM | POA: Diagnosis not present

## 2016-10-16 ENCOUNTER — Ambulatory Visit
Admission: RE | Admit: 2016-10-16 | Discharge: 2016-10-16 | Disposition: A | Discharge status: Home / Self Care | Payer: Commercial Managed Care - HMO | Source: Ambulatory Visit | Attending: Family Medicine | Admitting: Family Medicine

## 2016-10-16 DIAGNOSIS — Z1231 Encounter for screening mammogram for malignant neoplasm of breast: Secondary | ICD-10-CM | POA: Insufficient documentation

## 2016-10-22 DIAGNOSIS — M79675 Pain in left toe(s): Secondary | ICD-10-CM | POA: Diagnosis not present

## 2016-10-22 DIAGNOSIS — M79674 Pain in right toe(s): Secondary | ICD-10-CM | POA: Diagnosis not present

## 2016-10-22 DIAGNOSIS — B351 Tinea unguium: Secondary | ICD-10-CM | POA: Diagnosis not present

## 2016-10-24 DIAGNOSIS — R05 Cough: Secondary | ICD-10-CM | POA: Diagnosis not present

## 2016-10-24 DIAGNOSIS — J209 Acute bronchitis, unspecified: Secondary | ICD-10-CM | POA: Diagnosis not present

## 2016-11-27 DIAGNOSIS — I7300 Raynaud's syndrome without gangrene: Secondary | ICD-10-CM | POA: Diagnosis not present

## 2016-11-27 DIAGNOSIS — Z79899 Other long term (current) drug therapy: Secondary | ICD-10-CM | POA: Diagnosis not present

## 2016-11-27 DIAGNOSIS — S8012XA Contusion of left lower leg, initial encounter: Secondary | ICD-10-CM | POA: Diagnosis not present

## 2016-12-17 DIAGNOSIS — M349 Systemic sclerosis, unspecified: Secondary | ICD-10-CM | POA: Diagnosis not present

## 2016-12-17 DIAGNOSIS — I7300 Raynaud's syndrome without gangrene: Secondary | ICD-10-CM | POA: Diagnosis not present

## 2016-12-17 DIAGNOSIS — Z79899 Other long term (current) drug therapy: Secondary | ICD-10-CM | POA: Diagnosis not present

## 2016-12-17 DIAGNOSIS — I272 Pulmonary hypertension, unspecified: Secondary | ICD-10-CM | POA: Diagnosis not present

## 2016-12-30 DIAGNOSIS — M349 Systemic sclerosis, unspecified: Secondary | ICD-10-CM | POA: Diagnosis not present

## 2016-12-30 DIAGNOSIS — R9431 Abnormal electrocardiogram [ECG] [EKG]: Secondary | ICD-10-CM | POA: Diagnosis not present

## 2016-12-30 DIAGNOSIS — I7300 Raynaud's syndrome without gangrene: Secondary | ICD-10-CM | POA: Diagnosis not present

## 2016-12-30 DIAGNOSIS — R011 Cardiac murmur, unspecified: Secondary | ICD-10-CM | POA: Diagnosis not present

## 2016-12-30 DIAGNOSIS — E079 Disorder of thyroid, unspecified: Secondary | ICD-10-CM | POA: Diagnosis not present

## 2016-12-30 DIAGNOSIS — K219 Gastro-esophageal reflux disease without esophagitis: Secondary | ICD-10-CM | POA: Diagnosis not present

## 2016-12-30 DIAGNOSIS — I442 Atrioventricular block, complete: Secondary | ICD-10-CM | POA: Diagnosis not present

## 2016-12-30 DIAGNOSIS — R001 Bradycardia, unspecified: Secondary | ICD-10-CM | POA: Diagnosis not present

## 2016-12-30 DIAGNOSIS — I495 Sick sinus syndrome: Secondary | ICD-10-CM | POA: Diagnosis not present

## 2017-01-01 DIAGNOSIS — B351 Tinea unguium: Secondary | ICD-10-CM | POA: Diagnosis not present

## 2017-01-01 DIAGNOSIS — M79675 Pain in left toe(s): Secondary | ICD-10-CM | POA: Diagnosis not present

## 2017-01-01 DIAGNOSIS — M79674 Pain in right toe(s): Secondary | ICD-10-CM | POA: Diagnosis not present

## 2017-01-28 DIAGNOSIS — I2729 Other secondary pulmonary hypertension: Secondary | ICD-10-CM | POA: Diagnosis not present

## 2017-01-28 DIAGNOSIS — M349 Systemic sclerosis, unspecified: Secondary | ICD-10-CM | POA: Diagnosis not present

## 2017-01-28 DIAGNOSIS — R0602 Shortness of breath: Secondary | ICD-10-CM | POA: Diagnosis not present

## 2017-02-26 DIAGNOSIS — M349 Systemic sclerosis, unspecified: Secondary | ICD-10-CM | POA: Diagnosis not present

## 2017-02-26 DIAGNOSIS — R0602 Shortness of breath: Secondary | ICD-10-CM | POA: Diagnosis not present

## 2017-02-26 DIAGNOSIS — I2729 Other secondary pulmonary hypertension: Secondary | ICD-10-CM | POA: Diagnosis not present

## 2017-03-17 DIAGNOSIS — Z79899 Other long term (current) drug therapy: Secondary | ICD-10-CM | POA: Diagnosis not present

## 2017-03-17 DIAGNOSIS — I7300 Raynaud's syndrome without gangrene: Secondary | ICD-10-CM | POA: Diagnosis not present

## 2017-03-31 DIAGNOSIS — M79675 Pain in left toe(s): Secondary | ICD-10-CM | POA: Diagnosis not present

## 2017-03-31 DIAGNOSIS — B351 Tinea unguium: Secondary | ICD-10-CM | POA: Diagnosis not present

## 2017-03-31 DIAGNOSIS — M79674 Pain in right toe(s): Secondary | ICD-10-CM | POA: Diagnosis not present

## 2017-04-09 DIAGNOSIS — Z136 Encounter for screening for cardiovascular disorders: Secondary | ICD-10-CM | POA: Diagnosis not present

## 2017-04-09 DIAGNOSIS — Z0000 Encounter for general adult medical examination without abnormal findings: Secondary | ICD-10-CM | POA: Diagnosis not present

## 2017-04-09 DIAGNOSIS — E039 Hypothyroidism, unspecified: Secondary | ICD-10-CM | POA: Diagnosis not present

## 2017-04-09 DIAGNOSIS — I10 Essential (primary) hypertension: Secondary | ICD-10-CM | POA: Diagnosis not present

## 2017-04-16 DIAGNOSIS — E538 Deficiency of other specified B group vitamins: Secondary | ICD-10-CM | POA: Diagnosis not present

## 2017-04-16 DIAGNOSIS — I10 Essential (primary) hypertension: Secondary | ICD-10-CM | POA: Diagnosis not present

## 2017-04-16 DIAGNOSIS — Z23 Encounter for immunization: Secondary | ICD-10-CM | POA: Diagnosis not present

## 2017-04-16 DIAGNOSIS — E039 Hypothyroidism, unspecified: Secondary | ICD-10-CM | POA: Diagnosis not present

## 2017-04-16 DIAGNOSIS — Z950 Presence of cardiac pacemaker: Secondary | ICD-10-CM | POA: Insufficient documentation

## 2017-04-16 DIAGNOSIS — Z0000 Encounter for general adult medical examination without abnormal findings: Secondary | ICD-10-CM | POA: Diagnosis not present

## 2017-06-17 DIAGNOSIS — I7300 Raynaud's syndrome without gangrene: Secondary | ICD-10-CM | POA: Diagnosis not present

## 2017-06-17 DIAGNOSIS — Z79899 Other long term (current) drug therapy: Secondary | ICD-10-CM | POA: Diagnosis not present

## 2017-06-22 DIAGNOSIS — B349 Viral infection, unspecified: Secondary | ICD-10-CM | POA: Diagnosis not present

## 2017-06-24 DIAGNOSIS — I7300 Raynaud's syndrome without gangrene: Secondary | ICD-10-CM | POA: Diagnosis not present

## 2017-06-24 DIAGNOSIS — Z79899 Other long term (current) drug therapy: Secondary | ICD-10-CM | POA: Diagnosis not present

## 2017-06-24 DIAGNOSIS — M79642 Pain in left hand: Secondary | ICD-10-CM | POA: Diagnosis not present

## 2017-06-24 DIAGNOSIS — M79641 Pain in right hand: Secondary | ICD-10-CM | POA: Diagnosis not present

## 2017-06-24 DIAGNOSIS — M349 Systemic sclerosis, unspecified: Secondary | ICD-10-CM | POA: Diagnosis not present

## 2017-07-01 DIAGNOSIS — M79675 Pain in left toe(s): Secondary | ICD-10-CM | POA: Diagnosis not present

## 2017-07-01 DIAGNOSIS — B351 Tinea unguium: Secondary | ICD-10-CM | POA: Diagnosis not present

## 2017-07-01 DIAGNOSIS — M79674 Pain in right toe(s): Secondary | ICD-10-CM | POA: Diagnosis not present

## 2017-07-02 DIAGNOSIS — R9431 Abnormal electrocardiogram [ECG] [EKG]: Secondary | ICD-10-CM | POA: Diagnosis not present

## 2017-07-02 DIAGNOSIS — R001 Bradycardia, unspecified: Secondary | ICD-10-CM | POA: Diagnosis not present

## 2017-07-02 DIAGNOSIS — I495 Sick sinus syndrome: Secondary | ICD-10-CM | POA: Diagnosis not present

## 2017-07-02 DIAGNOSIS — E079 Disorder of thyroid, unspecified: Secondary | ICD-10-CM | POA: Diagnosis not present

## 2017-07-02 DIAGNOSIS — I442 Atrioventricular block, complete: Secondary | ICD-10-CM | POA: Diagnosis not present

## 2017-07-02 DIAGNOSIS — K219 Gastro-esophageal reflux disease without esophagitis: Secondary | ICD-10-CM | POA: Diagnosis not present

## 2017-07-02 DIAGNOSIS — R011 Cardiac murmur, unspecified: Secondary | ICD-10-CM | POA: Diagnosis not present

## 2017-07-02 DIAGNOSIS — M349 Systemic sclerosis, unspecified: Secondary | ICD-10-CM | POA: Diagnosis not present

## 2017-07-02 DIAGNOSIS — I7300 Raynaud's syndrome without gangrene: Secondary | ICD-10-CM | POA: Diagnosis not present

## 2017-09-17 DIAGNOSIS — M349 Systemic sclerosis, unspecified: Secondary | ICD-10-CM | POA: Diagnosis not present

## 2017-09-17 DIAGNOSIS — Z79899 Other long term (current) drug therapy: Secondary | ICD-10-CM | POA: Diagnosis not present

## 2017-09-17 DIAGNOSIS — I7300 Raynaud's syndrome without gangrene: Secondary | ICD-10-CM | POA: Diagnosis not present

## 2017-09-24 DIAGNOSIS — I7300 Raynaud's syndrome without gangrene: Secondary | ICD-10-CM | POA: Diagnosis not present

## 2017-09-24 DIAGNOSIS — Z79899 Other long term (current) drug therapy: Secondary | ICD-10-CM | POA: Diagnosis not present

## 2017-09-24 DIAGNOSIS — H0012 Chalazion right lower eyelid: Secondary | ICD-10-CM | POA: Diagnosis not present

## 2017-09-24 DIAGNOSIS — H00012 Hordeolum externum right lower eyelid: Secondary | ICD-10-CM | POA: Diagnosis not present

## 2017-09-24 DIAGNOSIS — M349 Systemic sclerosis, unspecified: Secondary | ICD-10-CM | POA: Diagnosis not present

## 2017-09-24 DIAGNOSIS — I272 Pulmonary hypertension, unspecified: Secondary | ICD-10-CM | POA: Diagnosis not present

## 2017-10-01 DIAGNOSIS — B351 Tinea unguium: Secondary | ICD-10-CM | POA: Diagnosis not present

## 2017-10-01 DIAGNOSIS — M79674 Pain in right toe(s): Secondary | ICD-10-CM | POA: Diagnosis not present

## 2017-10-01 DIAGNOSIS — M79675 Pain in left toe(s): Secondary | ICD-10-CM | POA: Diagnosis not present

## 2017-10-06 ENCOUNTER — Other Ambulatory Visit: Payer: Self-pay | Admitting: Family Medicine

## 2017-10-06 DIAGNOSIS — Z1231 Encounter for screening mammogram for malignant neoplasm of breast: Secondary | ICD-10-CM

## 2017-10-15 DIAGNOSIS — E538 Deficiency of other specified B group vitamins: Secondary | ICD-10-CM | POA: Diagnosis not present

## 2017-10-15 DIAGNOSIS — I10 Essential (primary) hypertension: Secondary | ICD-10-CM | POA: Diagnosis not present

## 2017-10-15 DIAGNOSIS — E039 Hypothyroidism, unspecified: Secondary | ICD-10-CM | POA: Diagnosis not present

## 2017-10-22 DIAGNOSIS — E039 Hypothyroidism, unspecified: Secondary | ICD-10-CM | POA: Diagnosis not present

## 2017-10-22 DIAGNOSIS — I10 Essential (primary) hypertension: Secondary | ICD-10-CM | POA: Diagnosis not present

## 2017-10-22 DIAGNOSIS — E538 Deficiency of other specified B group vitamins: Secondary | ICD-10-CM | POA: Diagnosis not present

## 2017-10-30 ENCOUNTER — Ambulatory Visit
Admission: RE | Admit: 2017-10-30 | Discharge: 2017-10-30 | Disposition: A | Discharge status: Home / Self Care | Payer: Commercial Managed Care - HMO | Source: Ambulatory Visit | Attending: Family Medicine | Admitting: Family Medicine

## 2017-10-30 DIAGNOSIS — Z1231 Encounter for screening mammogram for malignant neoplasm of breast: Secondary | ICD-10-CM | POA: Diagnosis not present

## 2017-12-24 DIAGNOSIS — Z79899 Other long term (current) drug therapy: Secondary | ICD-10-CM | POA: Diagnosis not present

## 2017-12-24 DIAGNOSIS — I7300 Raynaud's syndrome without gangrene: Secondary | ICD-10-CM | POA: Diagnosis not present

## 2017-12-24 DIAGNOSIS — M349 Systemic sclerosis, unspecified: Secondary | ICD-10-CM | POA: Diagnosis not present

## 2017-12-29 DIAGNOSIS — R9431 Abnormal electrocardiogram [ECG] [EKG]: Secondary | ICD-10-CM | POA: Diagnosis not present

## 2017-12-29 DIAGNOSIS — I495 Sick sinus syndrome: Secondary | ICD-10-CM | POA: Diagnosis not present

## 2017-12-29 DIAGNOSIS — R011 Cardiac murmur, unspecified: Secondary | ICD-10-CM | POA: Diagnosis not present

## 2017-12-29 DIAGNOSIS — R001 Bradycardia, unspecified: Secondary | ICD-10-CM | POA: Diagnosis not present

## 2017-12-29 DIAGNOSIS — E079 Disorder of thyroid, unspecified: Secondary | ICD-10-CM | POA: Diagnosis not present

## 2017-12-29 DIAGNOSIS — I442 Atrioventricular block, complete: Secondary | ICD-10-CM | POA: Diagnosis not present

## 2017-12-29 DIAGNOSIS — K219 Gastro-esophageal reflux disease without esophagitis: Secondary | ICD-10-CM | POA: Diagnosis not present

## 2017-12-29 DIAGNOSIS — M349 Systemic sclerosis, unspecified: Secondary | ICD-10-CM | POA: Diagnosis not present

## 2017-12-29 DIAGNOSIS — I7300 Raynaud's syndrome without gangrene: Secondary | ICD-10-CM | POA: Diagnosis not present

## 2018-01-04 DIAGNOSIS — M79674 Pain in right toe(s): Secondary | ICD-10-CM | POA: Diagnosis not present

## 2018-01-04 DIAGNOSIS — M79675 Pain in left toe(s): Secondary | ICD-10-CM | POA: Diagnosis not present

## 2018-01-04 DIAGNOSIS — B351 Tinea unguium: Secondary | ICD-10-CM | POA: Diagnosis not present

## 2018-01-27 DIAGNOSIS — R0602 Shortness of breath: Secondary | ICD-10-CM | POA: Diagnosis not present

## 2018-01-27 DIAGNOSIS — M349 Systemic sclerosis, unspecified: Secondary | ICD-10-CM | POA: Diagnosis not present

## 2018-01-27 DIAGNOSIS — I2729 Other secondary pulmonary hypertension: Secondary | ICD-10-CM | POA: Diagnosis not present

## 2018-02-18 DIAGNOSIS — M349 Systemic sclerosis, unspecified: Secondary | ICD-10-CM | POA: Diagnosis not present

## 2018-02-18 DIAGNOSIS — I7300 Raynaud's syndrome without gangrene: Secondary | ICD-10-CM | POA: Diagnosis not present

## 2018-02-18 DIAGNOSIS — M79642 Pain in left hand: Secondary | ICD-10-CM | POA: Diagnosis not present

## 2018-03-17 DIAGNOSIS — M349 Systemic sclerosis, unspecified: Secondary | ICD-10-CM | POA: Diagnosis not present

## 2018-03-17 DIAGNOSIS — Z79899 Other long term (current) drug therapy: Secondary | ICD-10-CM | POA: Diagnosis not present

## 2018-03-17 DIAGNOSIS — I7300 Raynaud's syndrome without gangrene: Secondary | ICD-10-CM | POA: Diagnosis not present

## 2018-03-24 DIAGNOSIS — I7300 Raynaud's syndrome without gangrene: Secondary | ICD-10-CM | POA: Diagnosis not present

## 2018-03-24 DIAGNOSIS — M349 Systemic sclerosis, unspecified: Secondary | ICD-10-CM | POA: Diagnosis not present

## 2018-03-24 DIAGNOSIS — I272 Pulmonary hypertension, unspecified: Secondary | ICD-10-CM | POA: Diagnosis not present

## 2018-03-24 DIAGNOSIS — Z79899 Other long term (current) drug therapy: Secondary | ICD-10-CM | POA: Diagnosis not present

## 2018-04-05 DIAGNOSIS — B351 Tinea unguium: Secondary | ICD-10-CM | POA: Diagnosis not present

## 2018-04-05 DIAGNOSIS — M79675 Pain in left toe(s): Secondary | ICD-10-CM | POA: Diagnosis not present

## 2018-04-05 DIAGNOSIS — M79674 Pain in right toe(s): Secondary | ICD-10-CM | POA: Diagnosis not present

## 2018-04-15 DIAGNOSIS — I10 Essential (primary) hypertension: Secondary | ICD-10-CM | POA: Diagnosis not present

## 2018-04-15 DIAGNOSIS — E039 Hypothyroidism, unspecified: Secondary | ICD-10-CM | POA: Diagnosis not present

## 2018-04-15 DIAGNOSIS — E538 Deficiency of other specified B group vitamins: Secondary | ICD-10-CM | POA: Diagnosis not present

## 2018-04-22 DIAGNOSIS — I10 Essential (primary) hypertension: Secondary | ICD-10-CM | POA: Diagnosis not present

## 2018-04-22 DIAGNOSIS — E538 Deficiency of other specified B group vitamins: Secondary | ICD-10-CM | POA: Diagnosis not present

## 2018-04-22 DIAGNOSIS — Z0000 Encounter for general adult medical examination without abnormal findings: Secondary | ICD-10-CM | POA: Diagnosis not present

## 2018-04-22 DIAGNOSIS — E039 Hypothyroidism, unspecified: Secondary | ICD-10-CM | POA: Diagnosis not present

## 2018-04-22 DIAGNOSIS — Z862 Personal history of diseases of the blood and blood-forming organs and certain disorders involving the immune mechanism: Secondary | ICD-10-CM | POA: Diagnosis not present

## 2018-06-23 DIAGNOSIS — I7300 Raynaud's syndrome without gangrene: Secondary | ICD-10-CM | POA: Diagnosis not present

## 2018-06-23 DIAGNOSIS — R011 Cardiac murmur, unspecified: Secondary | ICD-10-CM | POA: Diagnosis not present

## 2018-06-23 DIAGNOSIS — Z79899 Other long term (current) drug therapy: Secondary | ICD-10-CM | POA: Diagnosis not present

## 2018-06-23 DIAGNOSIS — K219 Gastro-esophageal reflux disease without esophagitis: Secondary | ICD-10-CM | POA: Diagnosis not present

## 2018-06-23 DIAGNOSIS — R9431 Abnormal electrocardiogram [ECG] [EKG]: Secondary | ICD-10-CM | POA: Diagnosis not present

## 2018-06-23 DIAGNOSIS — I495 Sick sinus syndrome: Secondary | ICD-10-CM | POA: Diagnosis not present

## 2018-06-23 DIAGNOSIS — I272 Pulmonary hypertension, unspecified: Secondary | ICD-10-CM | POA: Diagnosis not present

## 2018-06-23 DIAGNOSIS — E079 Disorder of thyroid, unspecified: Secondary | ICD-10-CM | POA: Diagnosis not present

## 2018-06-23 DIAGNOSIS — R001 Bradycardia, unspecified: Secondary | ICD-10-CM | POA: Diagnosis not present

## 2018-06-23 DIAGNOSIS — I442 Atrioventricular block, complete: Secondary | ICD-10-CM | POA: Diagnosis not present

## 2018-06-23 DIAGNOSIS — M349 Systemic sclerosis, unspecified: Secondary | ICD-10-CM | POA: Diagnosis not present

## 2018-07-06 DIAGNOSIS — M79675 Pain in left toe(s): Secondary | ICD-10-CM | POA: Diagnosis not present

## 2018-07-06 DIAGNOSIS — M79674 Pain in right toe(s): Secondary | ICD-10-CM | POA: Diagnosis not present

## 2018-07-06 DIAGNOSIS — B351 Tinea unguium: Secondary | ICD-10-CM | POA: Diagnosis not present

## 2018-07-09 DIAGNOSIS — L820 Inflamed seborrheic keratosis: Secondary | ICD-10-CM | POA: Diagnosis not present

## 2018-07-09 DIAGNOSIS — L570 Actinic keratosis: Secondary | ICD-10-CM | POA: Diagnosis not present

## 2018-07-09 DIAGNOSIS — D485 Neoplasm of uncertain behavior of skin: Secondary | ICD-10-CM | POA: Diagnosis not present

## 2018-07-09 DIAGNOSIS — I789 Disease of capillaries, unspecified: Secondary | ICD-10-CM | POA: Diagnosis not present

## 2018-07-09 DIAGNOSIS — Z872 Personal history of diseases of the skin and subcutaneous tissue: Secondary | ICD-10-CM | POA: Diagnosis not present

## 2018-07-09 DIAGNOSIS — C4491 Basal cell carcinoma of skin, unspecified: Secondary | ICD-10-CM

## 2018-07-09 DIAGNOSIS — C44319 Basal cell carcinoma of skin of other parts of face: Secondary | ICD-10-CM | POA: Diagnosis not present

## 2018-07-09 HISTORY — DX: Basal cell carcinoma of skin, unspecified: C44.91

## 2018-08-19 DIAGNOSIS — B353 Tinea pedis: Secondary | ICD-10-CM | POA: Diagnosis not present

## 2018-08-19 DIAGNOSIS — I788 Other diseases of capillaries: Secondary | ICD-10-CM | POA: Diagnosis not present

## 2018-08-19 DIAGNOSIS — L821 Other seborrheic keratosis: Secondary | ICD-10-CM | POA: Diagnosis not present

## 2018-08-19 DIAGNOSIS — Z1283 Encounter for screening for malignant neoplasm of skin: Secondary | ICD-10-CM | POA: Diagnosis not present

## 2018-08-19 DIAGNOSIS — B078 Other viral warts: Secondary | ICD-10-CM | POA: Diagnosis not present

## 2018-08-19 DIAGNOSIS — L219 Seborrheic dermatitis, unspecified: Secondary | ICD-10-CM | POA: Diagnosis not present

## 2018-08-19 DIAGNOSIS — L853 Xerosis cutis: Secondary | ICD-10-CM | POA: Diagnosis not present

## 2018-08-19 DIAGNOSIS — L570 Actinic keratosis: Secondary | ICD-10-CM | POA: Diagnosis not present

## 2018-08-19 DIAGNOSIS — D229 Melanocytic nevi, unspecified: Secondary | ICD-10-CM | POA: Diagnosis not present

## 2018-08-28 DIAGNOSIS — S4992XA Unspecified injury of left shoulder and upper arm, initial encounter: Secondary | ICD-10-CM | POA: Diagnosis not present

## 2018-08-28 DIAGNOSIS — S42212A Unspecified displaced fracture of surgical neck of left humerus, initial encounter for closed fracture: Secondary | ICD-10-CM | POA: Diagnosis not present

## 2018-08-28 DIAGNOSIS — S42292A Other displaced fracture of upper end of left humerus, initial encounter for closed fracture: Secondary | ICD-10-CM | POA: Diagnosis not present

## 2018-08-28 DIAGNOSIS — W010XXA Fall on same level from slipping, tripping and stumbling without subsequent striking against object, initial encounter: Secondary | ICD-10-CM | POA: Diagnosis not present

## 2018-08-30 DIAGNOSIS — S42232A 3-part fracture of surgical neck of left humerus, initial encounter for closed fracture: Secondary | ICD-10-CM | POA: Diagnosis not present

## 2018-09-08 DIAGNOSIS — M349 Systemic sclerosis, unspecified: Secondary | ICD-10-CM | POA: Diagnosis not present

## 2018-09-08 DIAGNOSIS — C4491 Basal cell carcinoma of skin, unspecified: Secondary | ICD-10-CM | POA: Diagnosis not present

## 2018-09-08 DIAGNOSIS — L578 Other skin changes due to chronic exposure to nonionizing radiation: Secondary | ICD-10-CM | POA: Diagnosis not present

## 2018-09-08 DIAGNOSIS — C44319 Basal cell carcinoma of skin of other parts of face: Secondary | ICD-10-CM | POA: Diagnosis not present

## 2018-09-16 DIAGNOSIS — I272 Pulmonary hypertension, unspecified: Secondary | ICD-10-CM | POA: Diagnosis not present

## 2018-09-16 DIAGNOSIS — Z79899 Other long term (current) drug therapy: Secondary | ICD-10-CM | POA: Diagnosis not present

## 2018-09-16 DIAGNOSIS — I7300 Raynaud's syndrome without gangrene: Secondary | ICD-10-CM | POA: Diagnosis not present

## 2018-09-16 DIAGNOSIS — M349 Systemic sclerosis, unspecified: Secondary | ICD-10-CM | POA: Diagnosis not present

## 2018-09-21 DIAGNOSIS — M25512 Pain in left shoulder: Secondary | ICD-10-CM | POA: Diagnosis not present

## 2018-09-21 DIAGNOSIS — S42232A 3-part fracture of surgical neck of left humerus, initial encounter for closed fracture: Secondary | ICD-10-CM | POA: Diagnosis not present

## 2018-09-23 DIAGNOSIS — I272 Pulmonary hypertension, unspecified: Secondary | ICD-10-CM | POA: Diagnosis not present

## 2018-09-23 DIAGNOSIS — M349 Systemic sclerosis, unspecified: Secondary | ICD-10-CM | POA: Diagnosis not present

## 2018-09-23 DIAGNOSIS — I7300 Raynaud's syndrome without gangrene: Secondary | ICD-10-CM | POA: Diagnosis not present

## 2018-09-23 DIAGNOSIS — M810 Age-related osteoporosis without current pathological fracture: Secondary | ICD-10-CM | POA: Diagnosis not present

## 2018-09-23 DIAGNOSIS — M25512 Pain in left shoulder: Secondary | ICD-10-CM | POA: Diagnosis not present

## 2018-09-30 DIAGNOSIS — L570 Actinic keratosis: Secondary | ICD-10-CM | POA: Diagnosis not present

## 2018-09-30 DIAGNOSIS — L853 Xerosis cutis: Secondary | ICD-10-CM | POA: Diagnosis not present

## 2018-09-30 DIAGNOSIS — Z85828 Personal history of other malignant neoplasm of skin: Secondary | ICD-10-CM | POA: Diagnosis not present

## 2018-09-30 DIAGNOSIS — B353 Tinea pedis: Secondary | ICD-10-CM | POA: Diagnosis not present

## 2018-10-06 DIAGNOSIS — M79674 Pain in right toe(s): Secondary | ICD-10-CM | POA: Diagnosis not present

## 2018-10-06 DIAGNOSIS — M810 Age-related osteoporosis without current pathological fracture: Secondary | ICD-10-CM | POA: Diagnosis not present

## 2018-10-06 DIAGNOSIS — M79675 Pain in left toe(s): Secondary | ICD-10-CM | POA: Diagnosis not present

## 2018-10-06 DIAGNOSIS — B351 Tinea unguium: Secondary | ICD-10-CM | POA: Diagnosis not present

## 2018-10-11 DIAGNOSIS — M25512 Pain in left shoulder: Secondary | ICD-10-CM | POA: Diagnosis not present

## 2018-10-11 DIAGNOSIS — S42232A 3-part fracture of surgical neck of left humerus, initial encounter for closed fracture: Secondary | ICD-10-CM | POA: Diagnosis not present

## 2018-10-20 DIAGNOSIS — I10 Essential (primary) hypertension: Secondary | ICD-10-CM | POA: Diagnosis not present

## 2018-10-20 DIAGNOSIS — Z862 Personal history of diseases of the blood and blood-forming organs and certain disorders involving the immune mechanism: Secondary | ICD-10-CM | POA: Diagnosis not present

## 2018-10-20 DIAGNOSIS — E039 Hypothyroidism, unspecified: Secondary | ICD-10-CM | POA: Diagnosis not present

## 2018-10-22 DIAGNOSIS — M810 Age-related osteoporosis without current pathological fracture: Secondary | ICD-10-CM | POA: Insufficient documentation

## 2018-10-28 DIAGNOSIS — L0201 Cutaneous abscess of face: Secondary | ICD-10-CM | POA: Diagnosis not present

## 2018-11-04 DIAGNOSIS — Z23 Encounter for immunization: Secondary | ICD-10-CM | POA: Diagnosis not present

## 2018-12-22 DIAGNOSIS — R001 Bradycardia, unspecified: Secondary | ICD-10-CM | POA: Diagnosis not present

## 2018-12-22 DIAGNOSIS — R011 Cardiac murmur, unspecified: Secondary | ICD-10-CM | POA: Diagnosis not present

## 2018-12-22 DIAGNOSIS — E079 Disorder of thyroid, unspecified: Secondary | ICD-10-CM | POA: Diagnosis not present

## 2018-12-22 DIAGNOSIS — R9431 Abnormal electrocardiogram [ECG] [EKG]: Secondary | ICD-10-CM | POA: Diagnosis not present

## 2018-12-22 DIAGNOSIS — I7300 Raynaud's syndrome without gangrene: Secondary | ICD-10-CM | POA: Diagnosis not present

## 2018-12-22 DIAGNOSIS — M349 Systemic sclerosis, unspecified: Secondary | ICD-10-CM | POA: Diagnosis not present

## 2018-12-22 DIAGNOSIS — I442 Atrioventricular block, complete: Secondary | ICD-10-CM | POA: Diagnosis not present

## 2018-12-22 DIAGNOSIS — I495 Sick sinus syndrome: Secondary | ICD-10-CM | POA: Diagnosis not present

## 2018-12-22 DIAGNOSIS — K219 Gastro-esophageal reflux disease without esophagitis: Secondary | ICD-10-CM | POA: Diagnosis not present

## 2018-12-23 DIAGNOSIS — I7300 Raynaud's syndrome without gangrene: Secondary | ICD-10-CM | POA: Diagnosis not present

## 2018-12-23 DIAGNOSIS — M25512 Pain in left shoulder: Secondary | ICD-10-CM | POA: Diagnosis not present

## 2018-12-23 DIAGNOSIS — S42295D Other nondisplaced fracture of upper end of left humerus, subsequent encounter for fracture with routine healing: Secondary | ICD-10-CM | POA: Diagnosis not present

## 2018-12-23 DIAGNOSIS — M6281 Muscle weakness (generalized): Secondary | ICD-10-CM | POA: Diagnosis not present

## 2018-12-23 DIAGNOSIS — G8929 Other chronic pain: Secondary | ICD-10-CM | POA: Diagnosis not present

## 2018-12-23 DIAGNOSIS — M25612 Stiffness of left shoulder, not elsewhere classified: Secondary | ICD-10-CM | POA: Diagnosis not present

## 2018-12-23 DIAGNOSIS — I272 Pulmonary hypertension, unspecified: Secondary | ICD-10-CM | POA: Diagnosis not present

## 2018-12-23 DIAGNOSIS — M349 Systemic sclerosis, unspecified: Secondary | ICD-10-CM | POA: Diagnosis not present

## 2018-12-27 DIAGNOSIS — M25512 Pain in left shoulder: Secondary | ICD-10-CM | POA: Diagnosis not present

## 2018-12-27 DIAGNOSIS — S42295D Other nondisplaced fracture of upper end of left humerus, subsequent encounter for fracture with routine healing: Secondary | ICD-10-CM | POA: Diagnosis not present

## 2018-12-27 DIAGNOSIS — M6281 Muscle weakness (generalized): Secondary | ICD-10-CM | POA: Diagnosis not present

## 2018-12-27 DIAGNOSIS — G8929 Other chronic pain: Secondary | ICD-10-CM | POA: Diagnosis not present

## 2018-12-27 DIAGNOSIS — M25612 Stiffness of left shoulder, not elsewhere classified: Secondary | ICD-10-CM | POA: Diagnosis not present

## 2018-12-30 DIAGNOSIS — Z85828 Personal history of other malignant neoplasm of skin: Secondary | ICD-10-CM | POA: Diagnosis not present

## 2018-12-30 DIAGNOSIS — S42295D Other nondisplaced fracture of upper end of left humerus, subsequent encounter for fracture with routine healing: Secondary | ICD-10-CM | POA: Diagnosis not present

## 2018-12-30 DIAGNOSIS — L570 Actinic keratosis: Secondary | ICD-10-CM | POA: Diagnosis not present

## 2019-01-04 DIAGNOSIS — M25612 Stiffness of left shoulder, not elsewhere classified: Secondary | ICD-10-CM | POA: Diagnosis not present

## 2019-01-04 DIAGNOSIS — M6281 Muscle weakness (generalized): Secondary | ICD-10-CM | POA: Diagnosis not present

## 2019-01-04 DIAGNOSIS — G8929 Other chronic pain: Secondary | ICD-10-CM | POA: Diagnosis not present

## 2019-01-04 DIAGNOSIS — M25512 Pain in left shoulder: Secondary | ICD-10-CM | POA: Diagnosis not present

## 2019-01-04 DIAGNOSIS — S42295D Other nondisplaced fracture of upper end of left humerus, subsequent encounter for fracture with routine healing: Secondary | ICD-10-CM | POA: Diagnosis not present

## 2019-01-06 DIAGNOSIS — S42295D Other nondisplaced fracture of upper end of left humerus, subsequent encounter for fracture with routine healing: Secondary | ICD-10-CM | POA: Diagnosis not present

## 2019-01-06 DIAGNOSIS — M79675 Pain in left toe(s): Secondary | ICD-10-CM | POA: Diagnosis not present

## 2019-01-06 DIAGNOSIS — B351 Tinea unguium: Secondary | ICD-10-CM | POA: Diagnosis not present

## 2019-01-06 DIAGNOSIS — M79674 Pain in right toe(s): Secondary | ICD-10-CM | POA: Diagnosis not present

## 2019-01-11 DIAGNOSIS — S42295D Other nondisplaced fracture of upper end of left humerus, subsequent encounter for fracture with routine healing: Secondary | ICD-10-CM | POA: Diagnosis not present

## 2019-01-14 DIAGNOSIS — M25612 Stiffness of left shoulder, not elsewhere classified: Secondary | ICD-10-CM | POA: Diagnosis not present

## 2019-01-14 DIAGNOSIS — M6281 Muscle weakness (generalized): Secondary | ICD-10-CM | POA: Diagnosis not present

## 2019-01-14 DIAGNOSIS — M25512 Pain in left shoulder: Secondary | ICD-10-CM | POA: Diagnosis not present

## 2019-01-14 DIAGNOSIS — G8929 Other chronic pain: Secondary | ICD-10-CM | POA: Diagnosis not present

## 2019-01-14 DIAGNOSIS — S42295D Other nondisplaced fracture of upper end of left humerus, subsequent encounter for fracture with routine healing: Secondary | ICD-10-CM | POA: Diagnosis not present

## 2019-01-18 DIAGNOSIS — S42295D Other nondisplaced fracture of upper end of left humerus, subsequent encounter for fracture with routine healing: Secondary | ICD-10-CM | POA: Diagnosis not present

## 2019-01-20 DIAGNOSIS — S42295D Other nondisplaced fracture of upper end of left humerus, subsequent encounter for fracture with routine healing: Secondary | ICD-10-CM | POA: Diagnosis not present

## 2019-01-25 DIAGNOSIS — M25612 Stiffness of left shoulder, not elsewhere classified: Secondary | ICD-10-CM | POA: Diagnosis not present

## 2019-01-25 DIAGNOSIS — M6281 Muscle weakness (generalized): Secondary | ICD-10-CM | POA: Diagnosis not present

## 2019-01-25 DIAGNOSIS — G8929 Other chronic pain: Secondary | ICD-10-CM | POA: Diagnosis not present

## 2019-01-25 DIAGNOSIS — S42295D Other nondisplaced fracture of upper end of left humerus, subsequent encounter for fracture with routine healing: Secondary | ICD-10-CM | POA: Diagnosis not present

## 2019-01-25 DIAGNOSIS — M25512 Pain in left shoulder: Secondary | ICD-10-CM | POA: Diagnosis not present

## 2019-01-27 DIAGNOSIS — S42295D Other nondisplaced fracture of upper end of left humerus, subsequent encounter for fracture with routine healing: Secondary | ICD-10-CM | POA: Diagnosis not present

## 2019-01-27 DIAGNOSIS — M25612 Stiffness of left shoulder, not elsewhere classified: Secondary | ICD-10-CM | POA: Diagnosis not present

## 2019-01-27 DIAGNOSIS — G8929 Other chronic pain: Secondary | ICD-10-CM | POA: Diagnosis not present

## 2019-01-27 DIAGNOSIS — M6281 Muscle weakness (generalized): Secondary | ICD-10-CM | POA: Diagnosis not present

## 2019-01-27 DIAGNOSIS — M25512 Pain in left shoulder: Secondary | ICD-10-CM | POA: Diagnosis not present

## 2019-01-31 DIAGNOSIS — S42295D Other nondisplaced fracture of upper end of left humerus, subsequent encounter for fracture with routine healing: Secondary | ICD-10-CM | POA: Diagnosis not present

## 2019-03-24 DIAGNOSIS — M349 Systemic sclerosis, unspecified: Secondary | ICD-10-CM | POA: Diagnosis not present

## 2019-03-24 DIAGNOSIS — I272 Pulmonary hypertension, unspecified: Secondary | ICD-10-CM | POA: Diagnosis not present

## 2019-03-24 DIAGNOSIS — I7300 Raynaud's syndrome without gangrene: Secondary | ICD-10-CM | POA: Diagnosis not present

## 2019-03-31 DIAGNOSIS — Z79899 Other long term (current) drug therapy: Secondary | ICD-10-CM | POA: Diagnosis not present

## 2019-03-31 DIAGNOSIS — M810 Age-related osteoporosis without current pathological fracture: Secondary | ICD-10-CM | POA: Diagnosis not present

## 2019-03-31 DIAGNOSIS — I7300 Raynaud's syndrome without gangrene: Secondary | ICD-10-CM | POA: Diagnosis not present

## 2019-03-31 DIAGNOSIS — M349 Systemic sclerosis, unspecified: Secondary | ICD-10-CM | POA: Diagnosis not present

## 2019-04-07 DIAGNOSIS — M79675 Pain in left toe(s): Secondary | ICD-10-CM | POA: Diagnosis not present

## 2019-04-07 DIAGNOSIS — B351 Tinea unguium: Secondary | ICD-10-CM | POA: Diagnosis not present

## 2019-04-07 DIAGNOSIS — M79674 Pain in right toe(s): Secondary | ICD-10-CM | POA: Diagnosis not present

## 2019-06-23 DIAGNOSIS — M349 Systemic sclerosis, unspecified: Secondary | ICD-10-CM | POA: Diagnosis not present

## 2019-06-23 DIAGNOSIS — I7300 Raynaud's syndrome without gangrene: Secondary | ICD-10-CM | POA: Diagnosis not present

## 2019-06-23 DIAGNOSIS — Z79899 Other long term (current) drug therapy: Secondary | ICD-10-CM | POA: Diagnosis not present

## 2019-06-30 DIAGNOSIS — I272 Pulmonary hypertension, unspecified: Secondary | ICD-10-CM | POA: Diagnosis not present

## 2019-06-30 DIAGNOSIS — Z79899 Other long term (current) drug therapy: Secondary | ICD-10-CM | POA: Diagnosis not present

## 2019-06-30 DIAGNOSIS — M349 Systemic sclerosis, unspecified: Secondary | ICD-10-CM | POA: Diagnosis not present

## 2019-06-30 DIAGNOSIS — I7300 Raynaud's syndrome without gangrene: Secondary | ICD-10-CM | POA: Diagnosis not present

## 2019-07-05 DIAGNOSIS — I442 Atrioventricular block, complete: Secondary | ICD-10-CM | POA: Diagnosis not present

## 2019-07-05 DIAGNOSIS — M349 Systemic sclerosis, unspecified: Secondary | ICD-10-CM | POA: Diagnosis not present

## 2019-07-05 DIAGNOSIS — I7300 Raynaud's syndrome without gangrene: Secondary | ICD-10-CM | POA: Diagnosis not present

## 2019-07-05 DIAGNOSIS — R9431 Abnormal electrocardiogram [ECG] [EKG]: Secondary | ICD-10-CM | POA: Diagnosis not present

## 2019-07-05 DIAGNOSIS — I495 Sick sinus syndrome: Secondary | ICD-10-CM | POA: Diagnosis not present

## 2019-07-05 DIAGNOSIS — R011 Cardiac murmur, unspecified: Secondary | ICD-10-CM | POA: Diagnosis not present

## 2019-07-05 DIAGNOSIS — R001 Bradycardia, unspecified: Secondary | ICD-10-CM | POA: Diagnosis not present

## 2019-07-05 DIAGNOSIS — K219 Gastro-esophageal reflux disease without esophagitis: Secondary | ICD-10-CM | POA: Diagnosis not present

## 2019-07-05 DIAGNOSIS — E079 Disorder of thyroid, unspecified: Secondary | ICD-10-CM | POA: Diagnosis not present

## 2019-07-08 DIAGNOSIS — M79675 Pain in left toe(s): Secondary | ICD-10-CM | POA: Diagnosis not present

## 2019-07-08 DIAGNOSIS — M79674 Pain in right toe(s): Secondary | ICD-10-CM | POA: Diagnosis not present

## 2019-07-08 DIAGNOSIS — B351 Tinea unguium: Secondary | ICD-10-CM | POA: Diagnosis not present

## 2019-07-21 DIAGNOSIS — I10 Essential (primary) hypertension: Secondary | ICD-10-CM | POA: Diagnosis not present

## 2019-07-21 DIAGNOSIS — E039 Hypothyroidism, unspecified: Secondary | ICD-10-CM | POA: Diagnosis not present

## 2019-07-21 DIAGNOSIS — Z136 Encounter for screening for cardiovascular disorders: Secondary | ICD-10-CM | POA: Diagnosis not present

## 2019-07-21 DIAGNOSIS — Z0000 Encounter for general adult medical examination without abnormal findings: Secondary | ICD-10-CM | POA: Diagnosis not present

## 2019-07-26 DIAGNOSIS — Z136 Encounter for screening for cardiovascular disorders: Secondary | ICD-10-CM | POA: Diagnosis not present

## 2019-07-26 DIAGNOSIS — I10 Essential (primary) hypertension: Secondary | ICD-10-CM | POA: Diagnosis not present

## 2019-07-26 DIAGNOSIS — E039 Hypothyroidism, unspecified: Secondary | ICD-10-CM | POA: Diagnosis not present

## 2019-09-01 DIAGNOSIS — L570 Actinic keratosis: Secondary | ICD-10-CM | POA: Diagnosis not present

## 2019-09-01 DIAGNOSIS — L821 Other seborrheic keratosis: Secondary | ICD-10-CM | POA: Diagnosis not present

## 2019-09-01 DIAGNOSIS — L814 Other melanin hyperpigmentation: Secondary | ICD-10-CM | POA: Diagnosis not present

## 2019-09-01 DIAGNOSIS — L820 Inflamed seborrheic keratosis: Secondary | ICD-10-CM | POA: Diagnosis not present

## 2019-09-01 DIAGNOSIS — D485 Neoplasm of uncertain behavior of skin: Secondary | ICD-10-CM | POA: Diagnosis not present

## 2019-09-01 DIAGNOSIS — C44311 Basal cell carcinoma of skin of nose: Secondary | ICD-10-CM | POA: Diagnosis not present

## 2019-09-01 DIAGNOSIS — Z85828 Personal history of other malignant neoplasm of skin: Secondary | ICD-10-CM | POA: Diagnosis not present

## 2019-09-01 HISTORY — DX: Actinic keratosis: L57.0

## 2019-09-08 DIAGNOSIS — L820 Inflamed seborrheic keratosis: Secondary | ICD-10-CM | POA: Diagnosis not present

## 2019-09-08 DIAGNOSIS — Z4802 Encounter for removal of sutures: Secondary | ICD-10-CM | POA: Diagnosis not present

## 2019-09-08 DIAGNOSIS — L570 Actinic keratosis: Secondary | ICD-10-CM | POA: Diagnosis not present

## 2019-09-15 DIAGNOSIS — M810 Age-related osteoporosis without current pathological fracture: Secondary | ICD-10-CM | POA: Diagnosis not present

## 2019-09-29 DIAGNOSIS — I7300 Raynaud's syndrome without gangrene: Secondary | ICD-10-CM | POA: Diagnosis not present

## 2019-09-29 DIAGNOSIS — Z79899 Other long term (current) drug therapy: Secondary | ICD-10-CM | POA: Diagnosis not present

## 2019-09-29 DIAGNOSIS — M349 Systemic sclerosis, unspecified: Secondary | ICD-10-CM | POA: Diagnosis not present

## 2019-10-04 DIAGNOSIS — M79674 Pain in right toe(s): Secondary | ICD-10-CM | POA: Diagnosis not present

## 2019-10-04 DIAGNOSIS — B351 Tinea unguium: Secondary | ICD-10-CM | POA: Diagnosis not present

## 2019-10-04 DIAGNOSIS — M79675 Pain in left toe(s): Secondary | ICD-10-CM | POA: Diagnosis not present

## 2019-10-20 DIAGNOSIS — D239 Other benign neoplasm of skin, unspecified: Secondary | ICD-10-CM

## 2019-10-20 DIAGNOSIS — L821 Other seborrheic keratosis: Secondary | ICD-10-CM | POA: Diagnosis not present

## 2019-10-20 DIAGNOSIS — D485 Neoplasm of uncertain behavior of skin: Secondary | ICD-10-CM | POA: Diagnosis not present

## 2019-10-20 DIAGNOSIS — Z1283 Encounter for screening for malignant neoplasm of skin: Secondary | ICD-10-CM | POA: Diagnosis not present

## 2019-10-20 DIAGNOSIS — B078 Other viral warts: Secondary | ICD-10-CM | POA: Diagnosis not present

## 2019-10-20 DIAGNOSIS — D225 Melanocytic nevi of trunk: Secondary | ICD-10-CM | POA: Diagnosis not present

## 2019-10-20 DIAGNOSIS — L570 Actinic keratosis: Secondary | ICD-10-CM | POA: Diagnosis not present

## 2019-10-20 DIAGNOSIS — L820 Inflamed seborrheic keratosis: Secondary | ICD-10-CM | POA: Diagnosis not present

## 2019-10-20 DIAGNOSIS — D229 Melanocytic nevi, unspecified: Secondary | ICD-10-CM | POA: Diagnosis not present

## 2019-10-20 HISTORY — DX: Other benign neoplasm of skin, unspecified: D23.9

## 2019-11-01 DIAGNOSIS — C44311 Basal cell carcinoma of skin of nose: Secondary | ICD-10-CM | POA: Diagnosis not present

## 2019-11-15 DIAGNOSIS — E039 Hypothyroidism, unspecified: Secondary | ICD-10-CM | POA: Diagnosis not present

## 2019-11-15 DIAGNOSIS — E538 Deficiency of other specified B group vitamins: Secondary | ICD-10-CM | POA: Diagnosis not present

## 2019-11-15 DIAGNOSIS — I10 Essential (primary) hypertension: Secondary | ICD-10-CM | POA: Diagnosis not present

## 2019-11-22 DIAGNOSIS — I10 Essential (primary) hypertension: Secondary | ICD-10-CM | POA: Diagnosis not present

## 2019-11-22 DIAGNOSIS — Z862 Personal history of diseases of the blood and blood-forming organs and certain disorders involving the immune mechanism: Secondary | ICD-10-CM | POA: Diagnosis not present

## 2019-11-22 DIAGNOSIS — E039 Hypothyroidism, unspecified: Secondary | ICD-10-CM | POA: Diagnosis not present

## 2019-11-30 DIAGNOSIS — L570 Actinic keratosis: Secondary | ICD-10-CM | POA: Diagnosis not present

## 2019-11-30 DIAGNOSIS — Z872 Personal history of diseases of the skin and subcutaneous tissue: Secondary | ICD-10-CM | POA: Diagnosis not present

## 2019-11-30 DIAGNOSIS — L853 Xerosis cutis: Secondary | ICD-10-CM | POA: Diagnosis not present

## 2019-11-30 DIAGNOSIS — L578 Other skin changes due to chronic exposure to nonionizing radiation: Secondary | ICD-10-CM | POA: Diagnosis not present

## 2019-11-30 DIAGNOSIS — Z86018 Personal history of other benign neoplasm: Secondary | ICD-10-CM | POA: Diagnosis not present

## 2019-12-22 DIAGNOSIS — I7300 Raynaud's syndrome without gangrene: Secondary | ICD-10-CM | POA: Diagnosis not present

## 2019-12-22 DIAGNOSIS — Z79899 Other long term (current) drug therapy: Secondary | ICD-10-CM | POA: Diagnosis not present

## 2019-12-22 DIAGNOSIS — M349 Systemic sclerosis, unspecified: Secondary | ICD-10-CM | POA: Diagnosis not present

## 2019-12-27 DIAGNOSIS — I495 Sick sinus syndrome: Secondary | ICD-10-CM | POA: Diagnosis not present

## 2019-12-29 DIAGNOSIS — M349 Systemic sclerosis, unspecified: Secondary | ICD-10-CM | POA: Diagnosis not present

## 2019-12-29 DIAGNOSIS — I7300 Raynaud's syndrome without gangrene: Secondary | ICD-10-CM | POA: Diagnosis not present

## 2019-12-29 DIAGNOSIS — Z79899 Other long term (current) drug therapy: Secondary | ICD-10-CM | POA: Diagnosis not present

## 2019-12-29 DIAGNOSIS — I272 Pulmonary hypertension, unspecified: Secondary | ICD-10-CM | POA: Diagnosis not present

## 2019-12-29 DIAGNOSIS — M810 Age-related osteoporosis without current pathological fracture: Secondary | ICD-10-CM | POA: Diagnosis not present

## 2020-01-10 DIAGNOSIS — M79675 Pain in left toe(s): Secondary | ICD-10-CM | POA: Diagnosis not present

## 2020-01-10 DIAGNOSIS — B351 Tinea unguium: Secondary | ICD-10-CM | POA: Diagnosis not present

## 2020-01-10 DIAGNOSIS — M79674 Pain in right toe(s): Secondary | ICD-10-CM | POA: Diagnosis not present

## 2020-01-10 DIAGNOSIS — M778 Other enthesopathies, not elsewhere classified: Secondary | ICD-10-CM | POA: Diagnosis not present

## 2020-01-11 DIAGNOSIS — L578 Other skin changes due to chronic exposure to nonionizing radiation: Secondary | ICD-10-CM | POA: Diagnosis not present

## 2020-01-11 DIAGNOSIS — Z85828 Personal history of other malignant neoplasm of skin: Secondary | ICD-10-CM | POA: Diagnosis not present

## 2020-01-11 DIAGNOSIS — L570 Actinic keratosis: Secondary | ICD-10-CM | POA: Diagnosis not present

## 2020-01-11 DIAGNOSIS — Z872 Personal history of diseases of the skin and subcutaneous tissue: Secondary | ICD-10-CM | POA: Diagnosis not present

## 2020-02-15 ENCOUNTER — Ambulatory Visit: Payer: Commercial Managed Care - HMO

## 2020-03-22 ENCOUNTER — Encounter: Payer: Self-pay | Admitting: Dermatology

## 2020-03-22 ENCOUNTER — Other Ambulatory Visit: Payer: Self-pay

## 2020-03-22 ENCOUNTER — Ambulatory Visit: Payer: Medicare HMO | Admitting: Dermatology

## 2020-03-22 DIAGNOSIS — D229 Melanocytic nevi, unspecified: Secondary | ICD-10-CM | POA: Diagnosis not present

## 2020-03-22 DIAGNOSIS — I781 Nevus, non-neoplastic: Secondary | ICD-10-CM | POA: Diagnosis not present

## 2020-03-22 DIAGNOSIS — Z872 Personal history of diseases of the skin and subcutaneous tissue: Secondary | ICD-10-CM

## 2020-03-22 DIAGNOSIS — L853 Xerosis cutis: Secondary | ICD-10-CM

## 2020-03-22 DIAGNOSIS — Z1283 Encounter for screening for malignant neoplasm of skin: Secondary | ICD-10-CM | POA: Diagnosis not present

## 2020-03-22 DIAGNOSIS — Z86018 Personal history of other benign neoplasm: Secondary | ICD-10-CM | POA: Diagnosis not present

## 2020-03-22 DIAGNOSIS — L814 Other melanin hyperpigmentation: Secondary | ICD-10-CM

## 2020-03-22 DIAGNOSIS — Z85828 Personal history of other malignant neoplasm of skin: Secondary | ICD-10-CM | POA: Diagnosis not present

## 2020-03-22 DIAGNOSIS — L820 Inflamed seborrheic keratosis: Secondary | ICD-10-CM | POA: Diagnosis not present

## 2020-03-22 DIAGNOSIS — L821 Other seborrheic keratosis: Secondary | ICD-10-CM | POA: Diagnosis not present

## 2020-03-22 NOTE — Progress Notes (Signed)
Follow-Up Visit   Subjective  [name] is a 74 y.o. female who presents for the following: Follow-up.  Patient here today for TBSE. She has a history of BCC, AK and Dysplastic Nevi. She is not aware of anything new or changing. She has some scaly spots on her face that get irritated.  Spot on R hand getting darker.  She has a h/o scleroderma.  The following portions of the chart were reviewed this encounter and updated as appropriate:     Review of Systems:  No other skin or systemic complaints except as noted in HPI or Assessment and Plan.  Objective  Well appearing patient in no apparent distress; mood and affect are within normal limits.  A full examination was performed including scalp, head, eyes, ears, nose, lips, neck, chest, axillae, abdomen, back, buttocks, bilateral upper extremities, bilateral lower extremities, hands, feet, fingers, toes, fingernails, and toenails. All findings within normal limits unless otherwise noted below.  Objective  Left Forehead, nasal dorsum: Well healed scar with no evidence of recurrence.   Objective  Right sup Breast: Scar with no evidence of recurrence.   Objective  Right Temple x 3, R cheek x 3, R hand dorsum x 1, R inf knee x 1 (8): Erythematous keratotic or waxy stuck-on papule. Pink scaly patch R inferior knee  Objective  Nose, Right Mid Forehead: 14mm partially blanchable red macule at right mid forehead  Objective  Legs: Dry flaky skin   Assessment & Plan  History of basal cell carcinoma (BCC) Left Forehead, nasal dorsum  Clear. Observe for recurrence. Call clinic for new or changing lesions.  Recommend regular skin exams, daily broad-spectrum spf 30+ sunscreen use, and photoprotection.     History of dysplastic nevus Right sup Breast  No evidence of recurrence, call clinic for new or changing lesions.   Inflamed seborrheic keratosis (8) Right Temple x 3, R cheek x 3, R hand dorsum x 1, R inf knee x  1  Recheck right inferior knee on follow up  Destruction of lesion - Right Temple x 3, R cheek x 3, R hand dorsum x 1, R inf knee x 1  Destruction method: cryotherapy   Informed consent: discussed and consent obtained   Lesion destroyed using liquid nitrogen: Yes   Region frozen until ice ball extended beyond lesion: Yes   Outcome: patient tolerated procedure well with no complications   Post-procedure details: wound care instructions given    Telangiectasia (2) Right Mid Forehead; Nose  Vs Hemangioma.  Pt has scleroderma. Benign appearing, will observe.    Xerosis cutis Legs  Continue AmLactin bid and/or VaniCream and gentle soap like Dove  Lentigines - Scattered tan macules - Discussed due to sun exposure - Benign, observe - Call for any changes  Seborrheic Keratoses - Stuck-on, waxy, tan-brown papules and plaques  - Discussed benign etiology and prognosis. - Observe - Call for any changes  Actinic Damage - diffuse scaly erythematous macules with underlying dyspigmentation - Recommend daily broad spectrum sunscreen SPF 30+ to sun-exposed areas, reapply every 2 hours as needed.  - Call for new or changing lesions.  Melanocytic Nevi - Tan-brown and/or pink-flesh-colored symmetric macules and papules - Benign appearing on exam today - Observation - Call clinic for new or changing moles - Recommend daily use of broad spectrum spf 30+ sunscreen to sun-exposed areas.   Skin cancer screening performed today.  Return in about 6 months (around 09/22/2020) for ISK follow up, recheck knee, with  Dr. Laurence Ferrari.  Graciella Belton, RMA, am acting as scribe for Brendolyn Patty, MD .  Documentation: I have reviewed the above documentation for accuracy and completeness, and I agree with the above.  Brendolyn Patty MD

## 2020-03-22 NOTE — Patient Instructions (Addendum)
Cryotherapy Aftercare  . Wash gently with soap and water everyday.   . Apply Vaseline and Band-Aid daily until healed. Recommend daily broad spectrum sunscreen SPF 30+ to sun-exposed areas, reapply every 2 hours as needed. Call for new or changing lesions.  

## 2020-03-27 DIAGNOSIS — Z79899 Other long term (current) drug therapy: Secondary | ICD-10-CM | POA: Diagnosis not present

## 2020-03-27 DIAGNOSIS — I7300 Raynaud's syndrome without gangrene: Secondary | ICD-10-CM | POA: Diagnosis not present

## 2020-03-27 DIAGNOSIS — I272 Pulmonary hypertension, unspecified: Secondary | ICD-10-CM | POA: Diagnosis not present

## 2020-03-27 DIAGNOSIS — M349 Systemic sclerosis, unspecified: Secondary | ICD-10-CM | POA: Diagnosis not present

## 2020-04-12 DIAGNOSIS — L97511 Non-pressure chronic ulcer of other part of right foot limited to breakdown of skin: Secondary | ICD-10-CM | POA: Diagnosis not present

## 2020-04-12 DIAGNOSIS — M778 Other enthesopathies, not elsewhere classified: Secondary | ICD-10-CM | POA: Diagnosis not present

## 2020-04-12 DIAGNOSIS — M79675 Pain in left toe(s): Secondary | ICD-10-CM | POA: Diagnosis not present

## 2020-04-12 DIAGNOSIS — M79674 Pain in right toe(s): Secondary | ICD-10-CM | POA: Diagnosis not present

## 2020-04-12 DIAGNOSIS — B351 Tinea unguium: Secondary | ICD-10-CM | POA: Diagnosis not present

## 2020-05-14 DIAGNOSIS — Z862 Personal history of diseases of the blood and blood-forming organs and certain disorders involving the immune mechanism: Secondary | ICD-10-CM | POA: Diagnosis not present

## 2020-05-14 DIAGNOSIS — I10 Essential (primary) hypertension: Secondary | ICD-10-CM | POA: Diagnosis not present

## 2020-05-14 DIAGNOSIS — E039 Hypothyroidism, unspecified: Secondary | ICD-10-CM | POA: Diagnosis not present

## 2020-05-22 DIAGNOSIS — I10 Essential (primary) hypertension: Secondary | ICD-10-CM | POA: Diagnosis not present

## 2020-05-22 DIAGNOSIS — Z0000 Encounter for general adult medical examination without abnormal findings: Secondary | ICD-10-CM | POA: Diagnosis not present

## 2020-05-22 DIAGNOSIS — D649 Anemia, unspecified: Secondary | ICD-10-CM | POA: Diagnosis not present

## 2020-06-26 DIAGNOSIS — I495 Sick sinus syndrome: Secondary | ICD-10-CM | POA: Diagnosis not present

## 2020-06-27 DIAGNOSIS — I272 Pulmonary hypertension, unspecified: Secondary | ICD-10-CM | POA: Diagnosis not present

## 2020-06-27 DIAGNOSIS — M349 Systemic sclerosis, unspecified: Secondary | ICD-10-CM | POA: Diagnosis not present

## 2020-06-27 DIAGNOSIS — Z79899 Other long term (current) drug therapy: Secondary | ICD-10-CM | POA: Diagnosis not present

## 2020-06-27 DIAGNOSIS — I7300 Raynaud's syndrome without gangrene: Secondary | ICD-10-CM | POA: Diagnosis not present

## 2020-07-02 DIAGNOSIS — Z79899 Other long term (current) drug therapy: Secondary | ICD-10-CM | POA: Diagnosis not present

## 2020-07-02 DIAGNOSIS — I7300 Raynaud's syndrome without gangrene: Secondary | ICD-10-CM | POA: Diagnosis not present

## 2020-07-02 DIAGNOSIS — M349 Systemic sclerosis, unspecified: Secondary | ICD-10-CM | POA: Diagnosis not present

## 2020-07-12 DIAGNOSIS — M79675 Pain in left toe(s): Secondary | ICD-10-CM | POA: Diagnosis not present

## 2020-07-12 DIAGNOSIS — B351 Tinea unguium: Secondary | ICD-10-CM | POA: Diagnosis not present

## 2020-07-12 DIAGNOSIS — L97511 Non-pressure chronic ulcer of other part of right foot limited to breakdown of skin: Secondary | ICD-10-CM | POA: Diagnosis not present

## 2020-07-12 DIAGNOSIS — M79674 Pain in right toe(s): Secondary | ICD-10-CM | POA: Diagnosis not present

## 2020-09-26 ENCOUNTER — Ambulatory Visit: Payer: Medicare HMO | Admitting: Dermatology

## 2020-09-26 ENCOUNTER — Other Ambulatory Visit: Payer: Self-pay

## 2020-09-26 DIAGNOSIS — L820 Inflamed seborrheic keratosis: Secondary | ICD-10-CM

## 2020-09-26 DIAGNOSIS — M349 Systemic sclerosis, unspecified: Secondary | ICD-10-CM

## 2020-09-26 DIAGNOSIS — I7300 Raynaud's syndrome without gangrene: Secondary | ICD-10-CM

## 2020-09-26 DIAGNOSIS — L570 Actinic keratosis: Secondary | ICD-10-CM

## 2020-09-26 DIAGNOSIS — L98499 Non-pressure chronic ulcer of skin of other sites with unspecified severity: Secondary | ICD-10-CM | POA: Diagnosis not present

## 2020-09-26 MED ORDER — MUPIROCIN 2 % EX OINT: 1.0000 "application " | TOPICAL_OINTMENT | Freq: Every day | CUTANEOUS | 2 refills | Status: DC

## 2020-09-26 NOTE — Patient Instructions (Signed)
Cryotherapy Aftercare  . Wash gently with soap and water everyday.   . Apply Vaseline and Band-Aid daily until healed.  Prior to procedure, discussed risks of blister formation, small wound, skin dyspigmentation, or rare scar following cryotherapy.   

## 2020-09-26 NOTE — Progress Notes (Signed)
   Follow-Up Visit   Subjective  Tammy ROTERT is a 74 y.o. female who presents for the following: Follow-up (Patient here today for 6 month ISK follow up. ).  ISK's were treated with LN2 at the right temple, right cheek, right hand and right knee. She advises there may be some residual at right cheek.  Patient with a history of BCC and dysplastic nevi. Her last FBSE was with Dr. Nicole Kindred 6 months ago.   She reports ulcerations on her fingers are not healing well and are painful. She is using neosporin.   The following portions of the chart were reviewed this encounter and updated as appropriate:  Allergies  Meds  Problems  Med Hx  Surg Hx  Fam Hx      Review of Systems:  No other skin or systemic complaints except as noted in HPI or Assessment and Plan.  Objective  Well appearing patient in no apparent distress; mood and affect are within normal limits.  A focused examination was performed including right leg, face, hands. Relevant physical exam findings are noted in the Assessment and Plan.  Objective  Right Hand - Anterior: Ulcerations at digits  Objective  R zygoma x 1, R cheek x 1: Erythematous keratotic or waxy stuck-on papule or plaque.   Objective  L zygoma: Erythematous thin papules/macules with gritty scale.    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 She reports digital ulcerations are not healing and are painful.   Chronic, not at goal  She is already on amlodipine. Start mupirocin to open areas on fingers daily and keep moist 24/7. Keep hands warm as much as possible.  Will plan to start nitroglycerin ointment to further improve blood flow and and may consider sildenafil and/or statin pending review of records and discussion with Dr. Jefm Bryant.  Discussed with patient and she is in agreement.   Ordered Medications: mupirocin ointment (BACTROBAN) 2 %  Inflamed seborrheic keratosis R zygoma x  1, R cheek x 1  Prior to procedure, discussed risks of blister formation, small wound, skin dyspigmentation, or rare scar following cryotherapy.    Destruction of lesion - R zygoma x 1, R cheek x 1 Complexity: simple   Destruction method: cryotherapy   Informed consent: discussed and consent obtained   Lesion destroyed using liquid nitrogen: Yes   Cryotherapy cycles:  2 Outcome: patient tolerated procedure well with no complications   Post-procedure details: wound care instructions given    AK (actinic keratosis) L zygoma  Prior to procedure, discussed risks of blister formation, small wound, skin dyspigmentation, or rare scar following cryotherapy.    Destruction of lesion - L zygoma Complexity: simple   Destruction method: cryotherapy   Informed consent: discussed and consent obtained   Lesion destroyed using liquid nitrogen: Yes   Cryotherapy cycles:  2 Outcome: patient tolerated procedure well with no complications   Post-procedure details: wound care instructions given    Return in about 6 months (around 03/26/2021) for TBSE, recheck AK's and hands in 1 month.  Graciella Belton, RMA, am acting as scribe for Forest Gleason, MD .  Documentation: I have reviewed the above documentation for accuracy and completeness, and I agree with the above.  Forest Gleason, MD

## 2020-09-27 ENCOUNTER — Ambulatory Visit: Payer: Medicare HMO | Admitting: Dermatology

## 2020-10-02 DIAGNOSIS — Z79899 Other long term (current) drug therapy: Secondary | ICD-10-CM | POA: Diagnosis not present

## 2020-10-02 DIAGNOSIS — I7300 Raynaud's syndrome without gangrene: Secondary | ICD-10-CM | POA: Diagnosis not present

## 2020-10-02 DIAGNOSIS — M349 Systemic sclerosis, unspecified: Secondary | ICD-10-CM | POA: Diagnosis not present

## 2020-10-03 ENCOUNTER — Telehealth: Payer: Self-pay | Admitting: Dermatology

## 2020-10-03 NOTE — Telephone Encounter (Signed)
After reviewing records from [facility], I called Mrs. Dokken to discuss options for her digital ulcerations in the setting of Raynaud's phenomenon and scleroderma.  Recommend adding nitro ointment (already prescribed by Dr. Jefm Bryant) in the webspaces between fingers. She advised that this makes her fingers hurt a little bit more and so she does not use it consistently.  I recommended trying to apply it on either side of just the fingers with ulcerations to see if we can get some improvement in healing if she is able to tolerate it.  She said she will try it.  I also recommend adding hydrocolloid bandages (blister Band-Aids) over ulcerations to aid in healing and provide some protection.  Will call Dr. Jefm Bryant to discuss whether adding sildenafil 20 mg 3 times daily would be an option for her.  Could also consider adding a statin if she has not had any problems tolerating a statin in the past.

## 2020-10-04 DIAGNOSIS — I495 Sick sinus syndrome: Secondary | ICD-10-CM | POA: Diagnosis not present

## 2020-10-04 DIAGNOSIS — I272 Pulmonary hypertension, unspecified: Secondary | ICD-10-CM | POA: Diagnosis not present

## 2020-10-04 DIAGNOSIS — E079 Disorder of thyroid, unspecified: Secondary | ICD-10-CM | POA: Diagnosis not present

## 2020-10-04 DIAGNOSIS — K219 Gastro-esophageal reflux disease without esophagitis: Secondary | ICD-10-CM | POA: Diagnosis not present

## 2020-10-04 DIAGNOSIS — Z862 Personal history of diseases of the blood and blood-forming organs and certain disorders involving the immune mechanism: Secondary | ICD-10-CM | POA: Diagnosis not present

## 2020-10-04 DIAGNOSIS — I10 Essential (primary) hypertension: Secondary | ICD-10-CM | POA: Diagnosis not present

## 2020-10-05 ENCOUNTER — Encounter: Payer: Self-pay | Admitting: Dermatology

## 2020-10-16 DIAGNOSIS — B351 Tinea unguium: Secondary | ICD-10-CM | POA: Diagnosis not present

## 2020-10-16 DIAGNOSIS — M79674 Pain in right toe(s): Secondary | ICD-10-CM | POA: Diagnosis not present

## 2020-10-16 DIAGNOSIS — L97511 Non-pressure chronic ulcer of other part of right foot limited to breakdown of skin: Secondary | ICD-10-CM | POA: Diagnosis not present

## 2020-10-16 DIAGNOSIS — M79675 Pain in left toe(s): Secondary | ICD-10-CM | POA: Diagnosis not present

## 2020-10-18 NOTE — Telephone Encounter (Signed)
Discussed with Dr. Jefm Bryant in Rheumatology and he does not have any concerns about adding sildenafil 20 mg three times per day for her. I will confirm with her pulmonologist and cardiologist that they do not have any concerns and plan to start this for her if they do not. May also consider adding statin in future.   MAs please call [facility] clinic cardiology for Dr. Clayborn Bigness and say Dr. Laurence Ferrari at [facility] would like to know if Dr. 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 If someone could call and let us know once Dr. Clayborn Bigness has had a chance to review, that would be wonderful or if he would like to discuss with me, please don't hesitate to call me at [phone] (my cell phone number).  And please ask Ms. 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  If someone could call and let us know once her pulmonologist has had a chance to review, that would be wonderful or if the pulmonologist would like to discuss with me, please don't hesitate to call me at [phone] (my cell phone number).  Thank you!

## 2020-10-24 NOTE — Telephone Encounter (Signed)
Unfortunately I can't send Dr. Netty Starring a note through epic.  Could you please call Dr. Raylene Miyamoto office with the same question?  Thank you

## 2020-10-25 NOTE — Telephone Encounter (Signed)
Called [name] back at [phone]. She was gone to lunch. Will return call this afternoon.

## 2020-10-29 NOTE — Telephone Encounter (Signed)
Thank you! We are now just waiting to hear back from Dr. Netty Starring? Was one of you able to call Dr. Raylene Miyamoto office with the question? Thank you!

## 2020-10-29 NOTE — Telephone Encounter (Signed)
Per Dr. Clayborn Bigness, okay to start medication.

## 2020-10-31 ENCOUNTER — Other Ambulatory Visit: Payer: Self-pay

## 2020-10-31 ENCOUNTER — Ambulatory Visit: Payer: Medicare HMO | Admitting: Dermatology

## 2020-10-31 DIAGNOSIS — C44329 Squamous cell carcinoma of skin of other parts of face: Secondary | ICD-10-CM | POA: Diagnosis not present

## 2020-10-31 DIAGNOSIS — I7300 Raynaud's syndrome without gangrene: Secondary | ICD-10-CM

## 2020-10-31 DIAGNOSIS — L98499 Non-pressure chronic ulcer of skin of other sites with unspecified severity: Secondary | ICD-10-CM | POA: Diagnosis not present

## 2020-10-31 DIAGNOSIS — D485 Neoplasm of uncertain behavior of skin: Secondary | ICD-10-CM

## 2020-10-31 DIAGNOSIS — L570 Actinic keratosis: Secondary | ICD-10-CM

## 2020-10-31 DIAGNOSIS — M349 Systemic sclerosis, unspecified: Secondary | ICD-10-CM | POA: Diagnosis not present

## 2020-10-31 DIAGNOSIS — D099 Carcinoma in situ, unspecified: Secondary | ICD-10-CM

## 2020-10-31 DIAGNOSIS — L659 Nonscarring hair loss, unspecified: Secondary | ICD-10-CM

## 2020-10-31 HISTORY — DX: Carcinoma in situ, unspecified: D09.9

## 2020-10-31 NOTE — Progress Notes (Signed)
Follow-Up Visit   Subjective  [name] is a 74 y.o. female who presents for the following: Follow-up (F/u for digital ulcerations on hands. Pt treating with mupirocin and nitro ointment. ) She has not noticed much improvement.  The following portions of the chart were reviewed this encounter and updated as appropriate:  Allergies   Meds   Problems   Med Hx   Surg Hx   Fam Hx       Review of Systems: No other skin or systemic complaints except as noted in HPI or Assessment and Plan.   Objective  Well appearing patient in no apparent distress; mood and affect are within normal limits.  A focused examination was performed including hands and face. Relevant physical exam findings are noted in the Assessment and Plan.  Objective  Right Hand - Anterior:   Objective  Right Hand - Anterior: White fingertips with digital ulcerations on multiple digits with loss of fingertips at some digits  Objective  Right Cheek: Right cheek 0.6 cm scaly pink papule  R/o SCC     Objective  left upper cutaneuos lip x 1, right neck x 1 (2): Erythematous thin papules/macules with gritty scale.   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 There is no cure, only control. Condition is symptomatic to patient and causes risk of infection and loss of digits if not well controlled. Currently flared.  Cont mupirocin oint three times a day to ulcerations Continue nitrous ointment to fingerwebs increase to 3 times per day  Already on amlodipine  Will prescribe Sildenafil 20 mg 3 x per day pending discussion  with Dr. Netty Starring.   May consider adding statin in future.   Keep hands warm. Recommend heated gloves.  Neoplasm of uncertain behavior of skin Right Cheek  Skin / nail biopsy Type of biopsy: tangential   Informed consent: discussed and consent obtained   Timeout: patient name, date of birth, surgical site, and procedure verified   Procedure prep:  Patient was prepped and draped in usual sterile fashion Prep type:  Isopropyl alcohol Anesthesia: the lesion was anesthetized in a standard fashion   Anesthetic:  1% lidocaine w/ epinephrine 1-100,000 buffered w/ 8.4% NaHCO3 Instrument used: flexible razor blade   Hemostasis achieved with: pressure, aluminum chloride and electrodesiccation   Outcome: patient tolerated procedure well   Post-procedure details: sterile dressing applied and wound care instructions given   Dressing type: bandage and petrolatum    Specimen 1 - Surgical pathology Differential Diagnosis: R/o SCC  Check Margins: No Right cheek 0.6 cm scaly pink papule    If positive will refer to [facility].   AK (actinic keratosis) (2) left upper cutaneuos lip x 1, right neck x 1  Prior to procedure, discussed risks of blister formation, small wound, skin dyspigmentation, or rare scar following cryotherapy.    Destruction of lesion - left upper cutaneuos lip x 1, right neck x 1  Destruction method: cryotherapy   Informed consent: discussed and consent obtained   Lesion destroyed using liquid nitrogen: Yes   Outcome: patient tolerated procedure well with no complications  Post-procedure details: wound care instructions given    Alopecia Right Temple  Unclear etiology, possibly with some component of breakage due to repeat rubbing Advised she does have regrowth in the area. Recommend avoiding rubbing the area and give it some time to see more regrowth.   Return in about 6 weeks (around 12/12/2020) for recheck hands and face.   I,  Harriett Sine, CMA, am acting as scribe for Forest Gleason, MD.   Documentation: I have reviewed the above documentation for accuracy and completeness, and I agree with the above.  Forest Gleason, MD

## 2020-10-31 NOTE — Patient Instructions (Signed)

## 2020-11-01 NOTE — Telephone Encounter (Signed)
Spoke with someone at Dr. Raylene Miyamoto office and they will check with him and advise if ok for pt to start sildenafil 20mg  tid for ulcerations due to Raynaud's and systemic sclerosis, JS

## 2020-11-01 NOTE — Progress Notes (Signed)
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 If she has questions for Dr. Laurence Ferrari, let Dr. Jerilynn Mages know. Thank you!

## 2020-11-08 ENCOUNTER — Encounter: Payer: Self-pay | Admitting: Dermatology

## 2020-11-13 ENCOUNTER — Telehealth: Payer: Self-pay

## 2020-11-13 ENCOUNTER — Other Ambulatory Visit: Payer: Self-pay

## 2020-11-13 DIAGNOSIS — D485 Neoplasm of uncertain behavior of skin: Secondary | ICD-10-CM

## 2020-11-13 NOTE — Telephone Encounter (Signed)
Patient advised bx results show SCC, referral sent for Moh's at [facility], [place]

## 2020-11-13 NOTE — Telephone Encounter (Signed)
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 If she has questions for Dr. Neale Burly, let Dr. Judie Petit know. Thank you!

## 2020-11-15 DIAGNOSIS — Z862 Personal history of diseases of the blood and blood-forming organs and certain disorders involving the immune mechanism: Secondary | ICD-10-CM | POA: Diagnosis not present

## 2020-11-15 DIAGNOSIS — I10 Essential (primary) hypertension: Secondary | ICD-10-CM | POA: Diagnosis not present

## 2020-11-15 DIAGNOSIS — Z136 Encounter for screening for cardiovascular disorders: Secondary | ICD-10-CM | POA: Diagnosis not present

## 2020-11-15 DIAGNOSIS — E039 Hypothyroidism, unspecified: Secondary | ICD-10-CM | POA: Diagnosis not present

## 2020-11-22 ENCOUNTER — Telehealth: Payer: Self-pay | Admitting: Dermatology

## 2020-11-22 DIAGNOSIS — I10 Essential (primary) hypertension: Secondary | ICD-10-CM | POA: Diagnosis not present

## 2020-11-22 DIAGNOSIS — I495 Sick sinus syndrome: Secondary | ICD-10-CM | POA: Diagnosis not present

## 2020-11-22 DIAGNOSIS — E039 Hypothyroidism, unspecified: Secondary | ICD-10-CM | POA: Diagnosis not present

## 2020-11-22 DIAGNOSIS — I2729 Other secondary pulmonary hypertension: Secondary | ICD-10-CM | POA: Diagnosis not present

## 2020-11-22 DIAGNOSIS — M349 Systemic sclerosis, unspecified: Secondary | ICD-10-CM | POA: Diagnosis not present

## 2020-11-22 DIAGNOSIS — Z0000 Encounter for general adult medical examination without abnormal findings: Secondary | ICD-10-CM | POA: Diagnosis not present

## 2020-11-22 DIAGNOSIS — D649 Anemia, unspecified: Secondary | ICD-10-CM | POA: Diagnosis not present

## 2020-11-22 NOTE — Telephone Encounter (Signed)
I spoke with Dr. Burnadette Pop who did not have any particular concerns about [place] starting sildenafil for [name] for her digital ulcerations. Called patient to discuss starting. Would recommend starting 10 mg three times per day watching for any lightheadedness or other side effects. If tolerating well, would increase to 20 mg three times per day for optimal treatment of digital ulcerations.  Called patient to discuss and she notes she has improvement since using the nitro ointment to the finger webs consistently and one of the ulcers has completely healed. Will defer adding sildenafil at this time but can consider adding in future if she has progression or failure of some ulcerations to heal. We will see her back in follow-up next month. She is also scheduled for Mohs surgery for her squamous cell skin cancer of the cheek.

## 2020-12-18 ENCOUNTER — Ambulatory Visit: Payer: Medicare HMO | Admitting: Dermatology

## 2020-12-18 ENCOUNTER — Other Ambulatory Visit: Payer: Self-pay

## 2020-12-18 ENCOUNTER — Encounter: Payer: Self-pay | Admitting: Dermatology

## 2020-12-18 DIAGNOSIS — L98499 Non-pressure chronic ulcer of skin of other sites with unspecified severity: Secondary | ICD-10-CM | POA: Diagnosis not present

## 2020-12-18 DIAGNOSIS — I7300 Raynaud's syndrome without gangrene: Secondary | ICD-10-CM | POA: Diagnosis not present

## 2020-12-18 DIAGNOSIS — Z872 Personal history of diseases of the skin and subcutaneous tissue: Secondary | ICD-10-CM | POA: Diagnosis not present

## 2020-12-18 MED ORDER — SILDENAFIL CITRATE 20 MG PO TABS: ORAL_TABLET | ORAL | 0 refills | Status: DC

## 2020-12-18 NOTE — Progress Notes (Signed)
   Follow-Up Visit   Subjective  [name] is a 75 y.o. female who presents for the following: Raynaud's phenomenon (Of the B/L hands - patient currently using Nitro ointment, and Mupirocin 2% ointment TID PRN ulcerations. ).   The following portions of the chart were reviewed this encounter and updated as appropriate:   Allergies  Meds  Problems  Med Hx  Surg Hx  Fam Hx     Review of Systems:  No other skin or systemic complaints except as noted in HPI or Assessment and Plan.  Objective  Well appearing patient in no apparent distress; mood and affect are within normal limits.  A focused examination was performed including the hands. Relevant physical exam findings are noted in the Assessment and Plan.  Objective  B/L hands: White fingertips with digital ulcerations on multiple digits with loss of fingertips at some digits  Objective  R neck, L upper cutaneous lip: Clear. Observe for recurrence.    Assessment & Plan  Raynaud's disease without gangrene B/L hands  Chronic condition with duration over one year. Condition is bothersome to patient. Currently flared with persistent ulcerations.  With digital ulcerations  Will start Sildenafil 10 mg 3 times per day watching for any lightheadedness or other side effects. If tolerating well at 1 month follow-up, would increase to 20 mg three times per day for optimal treatment of digital ulcerations. May consider adding statin in future.  Continue nitro ointment three times per day to finger webs  Continue amlodipine per PCP  BP 111/69    Hx of actinic keratosis R neck, L upper cutaneous lip  Clear. Observe for recurrence. Call clinic for new or changing lesions.  Recommend regular skin exams, daily broad-spectrum spf 30+ sunscreen use, and photoprotection.     Return in about 4 weeks (around 01/15/2021) for TBSE, Raynauds .  Luther Redo, CMA, am acting as scribe for Forest Gleason, MD . Documentation: I have  reviewed the above documentation for accuracy and completeness, and I agree with the above.  Forest Gleason, MD

## 2020-12-18 NOTE — Addendum Note (Signed)
Addended by: Marye Round on: 12/18/2020 10:56 AM   Modules accepted: Orders

## 2020-12-31 DIAGNOSIS — I7300 Raynaud's syndrome without gangrene: Secondary | ICD-10-CM | POA: Diagnosis not present

## 2020-12-31 DIAGNOSIS — Z79899 Other long term (current) drug therapy: Secondary | ICD-10-CM | POA: Diagnosis not present

## 2020-12-31 DIAGNOSIS — M349 Systemic sclerosis, unspecified: Secondary | ICD-10-CM | POA: Diagnosis not present

## 2021-01-01 DIAGNOSIS — Z8679 Personal history of other diseases of the circulatory system: Secondary | ICD-10-CM | POA: Diagnosis not present

## 2021-01-01 DIAGNOSIS — I495 Sick sinus syndrome: Secondary | ICD-10-CM | POA: Diagnosis not present

## 2021-01-07 DIAGNOSIS — I272 Pulmonary hypertension, unspecified: Secondary | ICD-10-CM | POA: Diagnosis not present

## 2021-01-07 DIAGNOSIS — M349 Systemic sclerosis, unspecified: Secondary | ICD-10-CM | POA: Diagnosis not present

## 2021-01-07 DIAGNOSIS — I7300 Raynaud's syndrome without gangrene: Secondary | ICD-10-CM | POA: Diagnosis not present

## 2021-01-07 DIAGNOSIS — M810 Age-related osteoporosis without current pathological fracture: Secondary | ICD-10-CM | POA: Diagnosis not present

## 2021-01-08 DIAGNOSIS — C44329 Squamous cell carcinoma of skin of other parts of face: Secondary | ICD-10-CM | POA: Diagnosis not present

## 2021-01-16 DIAGNOSIS — M2041 Other hammer toe(s) (acquired), right foot: Secondary | ICD-10-CM | POA: Diagnosis not present

## 2021-01-16 DIAGNOSIS — B351 Tinea unguium: Secondary | ICD-10-CM | POA: Diagnosis not present

## 2021-01-16 DIAGNOSIS — M2042 Other hammer toe(s) (acquired), left foot: Secondary | ICD-10-CM | POA: Diagnosis not present

## 2021-01-16 DIAGNOSIS — L600 Ingrowing nail: Secondary | ICD-10-CM | POA: Diagnosis not present

## 2021-01-16 DIAGNOSIS — M79675 Pain in left toe(s): Secondary | ICD-10-CM | POA: Diagnosis not present

## 2021-01-16 DIAGNOSIS — M778 Other enthesopathies, not elsewhere classified: Secondary | ICD-10-CM | POA: Diagnosis not present

## 2021-01-16 DIAGNOSIS — M79674 Pain in right toe(s): Secondary | ICD-10-CM | POA: Diagnosis not present

## 2021-01-29 ENCOUNTER — Other Ambulatory Visit: Payer: Self-pay

## 2021-01-29 ENCOUNTER — Encounter: Payer: Self-pay | Admitting: Dermatology

## 2021-01-29 ENCOUNTER — Ambulatory Visit (INDEPENDENT_AMBULATORY_CARE_PROVIDER_SITE_OTHER): Payer: Medicare HMO | Admitting: Dermatology

## 2021-01-29 DIAGNOSIS — D1800 Hemangioma unspecified site: Secondary | ICD-10-CM | POA: Diagnosis not present

## 2021-01-29 DIAGNOSIS — L814 Other melanin hyperpigmentation: Secondary | ICD-10-CM

## 2021-01-29 DIAGNOSIS — L821 Other seborrheic keratosis: Secondary | ICD-10-CM

## 2021-01-29 DIAGNOSIS — I7300 Raynaud's syndrome without gangrene: Secondary | ICD-10-CM

## 2021-01-29 DIAGNOSIS — T148XXA Other injury of unspecified body region, initial encounter: Secondary | ICD-10-CM

## 2021-01-29 DIAGNOSIS — D229 Melanocytic nevi, unspecified: Secondary | ICD-10-CM | POA: Diagnosis not present

## 2021-01-29 DIAGNOSIS — L578 Other skin changes due to chronic exposure to nonionizing radiation: Secondary | ICD-10-CM | POA: Diagnosis not present

## 2021-01-29 DIAGNOSIS — Z85828 Personal history of other malignant neoplasm of skin: Secondary | ICD-10-CM

## 2021-01-29 DIAGNOSIS — Z1283 Encounter for screening for malignant neoplasm of skin: Secondary | ICD-10-CM | POA: Diagnosis not present

## 2021-01-29 MED ORDER — SILDENAFIL CITRATE 20 MG PO TABS: ORAL_TABLET | ORAL | 1 refills | Status: DC

## 2021-01-29 NOTE — Patient Instructions (Signed)
Right cheek  Recommend Serica moisturizing scar formula cream every night or [employer] brand or Mederma silicone scar sheet every night for the first year after a scar appears to help with scar remodeling if desired. Scars remodel on their own for a full year.

## 2021-01-29 NOTE — Progress Notes (Signed)
Follow-Up Visit   Subjective  [name] is a 75 y.o. female who presents for the following: Annual Exam (Mole check, hx of skin cancer SCC R cheek treated with Mohs surgery 01/08/2021) and Follow-up (1 month f/u Raynaud's disease).  Patient here for full body skin exam and skin cancer screening.  She notes improvement in ulcers at fingertips on the sildenafil and tolerated it well at 10 mg three times daily but she ran out of medication.   The following portions of the chart were reviewed this encounter and updated as appropriate:   Allergies  Meds  Problems  Med Hx  Surg Hx  Fam Hx      Review of Systems:  No other skin or systemic complaints except as noted in HPI or Assessment and Plan.  Objective  Well appearing patient in no apparent distress; mood and affect are within normal limits.  A full examination was performed including scalp, head, eyes, ears, nose, lips, neck, chest, axillae, abdomen, back, buttocks, bilateral upper extremities, bilateral lower extremities, hands, feet, fingers, toes, fingernails, and toenails. All findings within normal limits unless otherwise noted below.  Objective  R cheek: Well healed scar with no evidence of recurrence, no lymphadenopathy.   Objective  right zygoma: Excoriation   Objective  Left Hand - Anterior: Digital ulceration   Assessment & Plan  History of SCC (squamous cell carcinoma) of skin R cheek  Clear. Observe for recurrence. Call clinic for new or changing lesions.  Recommend regular skin exams, daily broad-spectrum spf 30+ sunscreen use, and photoprotection.     Recommend Serica moisturizing scar formula cream every night or [facility] brand or Mederma silicone scar sheet every night for the first year after a scar appears to help with scar remodeling if desired. Scars remodel on their own for a full year.   Excoriation right zygoma  Start otc Vaseline apply to skin daily. Call if not clear in a  month.  Raynaud's disease without gangrene Left Hand - Anterior  With digital ulcerations  Chronic condition with duration over one year. Condition is bothersome to patient. Currently flared with persistent ulcerations.   Will restart Sildenafil 10 mg 3 times per day for one week, then increase to 20 mg 3 times per day monitoring for any lightheadedness or other side effects.  Reviewed blood pressure from visit with Dr. Jefm Bryant while she was on the 10 mg 3 times per day and it was unremarkable   Continue nitro ointment three times per day to finger webs  Other Related Medications sildenafil (REVATIO) 20 MG tablet   Lentigines - Scattered tan macules - Due to sun exposure - Benign-appering, observe - Recommend daily broad spectrum sunscreen SPF 30+ to sun-exposed areas, reapply every 2 hours as needed. - Call for any changes  Seborrheic Keratoses - Stuck-on, waxy, tan-brown papules and plaques  - Discussed benign etiology and prognosis. - Observe - Call for any changes  Melanocytic Nevi - Tan-brown and/or pink-flesh-colored symmetric macules and papules - Benign appearing on exam today - Observation - Call clinic for new or changing moles - Recommend daily use of broad spectrum spf 30+ sunscreen to sun-exposed areas.   Hemangiomas - Red papules - Discussed benign nature - Observe - Call for any changes  Actinic Damage - Chronic, secondary to cumulative UV/sun exposure - diffuse scaly erythematous macules with underlying dyspigmentation - Recommend daily broad spectrum sunscreen SPF 30+ to sun-exposed areas, reapply every 2 hours as needed.  - Call for  new or changing lesions.  Skin cancer screening performed today.  Return in about 3 months (around 05/01/2021) for Raynauds, TBSE in 6 months .  I, Marye Round, CMA, am acting as scribe for Forest Gleason, MD .  Documentation: I have reviewed the above documentation for accuracy and completeness, and I agree with  the above.  Forest Gleason, MD

## 2021-04-02 DIAGNOSIS — I495 Sick sinus syndrome: Secondary | ICD-10-CM | POA: Diagnosis not present

## 2021-04-08 DIAGNOSIS — Z5181 Encounter for therapeutic drug level monitoring: Secondary | ICD-10-CM | POA: Diagnosis not present

## 2021-04-08 DIAGNOSIS — I495 Sick sinus syndrome: Secondary | ICD-10-CM | POA: Diagnosis not present

## 2021-04-08 DIAGNOSIS — Z7901 Long term (current) use of anticoagulants: Secondary | ICD-10-CM | POA: Diagnosis not present

## 2021-04-08 DIAGNOSIS — Z862 Personal history of diseases of the blood and blood-forming organs and certain disorders involving the immune mechanism: Secondary | ICD-10-CM | POA: Diagnosis not present

## 2021-04-08 DIAGNOSIS — M349 Systemic sclerosis, unspecified: Secondary | ICD-10-CM | POA: Diagnosis not present

## 2021-04-08 DIAGNOSIS — I10 Essential (primary) hypertension: Secondary | ICD-10-CM | POA: Diagnosis not present

## 2021-04-08 DIAGNOSIS — Z01818 Encounter for other preprocedural examination: Secondary | ICD-10-CM | POA: Diagnosis not present

## 2021-04-08 DIAGNOSIS — Z79899 Other long term (current) drug therapy: Secondary | ICD-10-CM | POA: Diagnosis not present

## 2021-04-08 DIAGNOSIS — I7300 Raynaud's syndrome without gangrene: Secondary | ICD-10-CM | POA: Diagnosis not present

## 2021-04-08 DIAGNOSIS — Z8679 Personal history of other diseases of the circulatory system: Secondary | ICD-10-CM | POA: Diagnosis not present

## 2021-04-08 DIAGNOSIS — I272 Pulmonary hypertension, unspecified: Secondary | ICD-10-CM | POA: Diagnosis not present

## 2021-04-09 DIAGNOSIS — I272 Pulmonary hypertension, unspecified: Secondary | ICD-10-CM | POA: Diagnosis not present

## 2021-04-09 DIAGNOSIS — M349 Systemic sclerosis, unspecified: Secondary | ICD-10-CM | POA: Diagnosis not present

## 2021-04-18 DIAGNOSIS — B351 Tinea unguium: Secondary | ICD-10-CM | POA: Diagnosis not present

## 2021-04-18 DIAGNOSIS — M79674 Pain in right toe(s): Secondary | ICD-10-CM | POA: Diagnosis not present

## 2021-04-18 DIAGNOSIS — M79675 Pain in left toe(s): Secondary | ICD-10-CM | POA: Diagnosis not present

## 2021-04-22 DIAGNOSIS — I350 Nonrheumatic aortic (valve) stenosis: Secondary | ICD-10-CM

## 2021-04-22 DIAGNOSIS — M349 Systemic sclerosis, unspecified: Secondary | ICD-10-CM | POA: Diagnosis not present

## 2021-04-22 DIAGNOSIS — I272 Pulmonary hypertension, unspecified: Secondary | ICD-10-CM | POA: Diagnosis not present

## 2021-04-22 HISTORY — DX: Nonrheumatic aortic (valve) stenosis: I35.0

## 2021-04-25 ENCOUNTER — Encounter: Admission: RE | Payer: Self-pay | Source: Home / Self Care

## 2021-04-25 ENCOUNTER — Ambulatory Visit: Admission: RE | Admit: 2021-04-25 | Payer: Medicare HMO | Source: Home / Self Care | Admitting: Cardiology

## 2021-04-25 DIAGNOSIS — Z45018 Encounter for adjustment and management of other part of cardiac pacemaker: Secondary | ICD-10-CM

## 2021-04-25 SURGERY — PPM GENERATOR CHANGEOUT
Anesthesia: Moderate Sedation

## 2021-05-15 ENCOUNTER — Ambulatory Visit: Payer: Medicare HMO | Admitting: Dermatology

## 2021-05-15 ENCOUNTER — Other Ambulatory Visit: Payer: Self-pay

## 2021-05-15 ENCOUNTER — Encounter: Payer: Self-pay | Admitting: Dermatology

## 2021-05-15 DIAGNOSIS — L03113 Cellulitis of right upper limb: Secondary | ICD-10-CM

## 2021-05-15 DIAGNOSIS — L03011 Cellulitis of right finger: Secondary | ICD-10-CM | POA: Diagnosis not present

## 2021-05-15 DIAGNOSIS — I7301 Raynaud's syndrome with gangrene: Secondary | ICD-10-CM

## 2021-05-15 DIAGNOSIS — L0291 Cutaneous abscess, unspecified: Secondary | ICD-10-CM

## 2021-05-15 DIAGNOSIS — L02511 Cutaneous abscess of right hand: Secondary | ICD-10-CM | POA: Diagnosis not present

## 2021-05-15 MED ORDER — MUPIROCIN 2 % EX OINT: 1.0000 "application " | TOPICAL_OINTMENT | Freq: Three times a day (TID) | CUTANEOUS | 0 refills | Status: DC

## 2021-05-15 MED ORDER — CLINDAMYCIN HCL 300 MG PO CAPS: 300.0000 mg | ORAL_CAPSULE | Freq: Three times a day (TID) | ORAL | 0 refills | Status: AC

## 2021-05-15 NOTE — Progress Notes (Signed)
   Follow-Up Visit   Subjective  [name] is a 75 y.o. female who presents for the following: 3 months follow up (Patient here for follow up on raynaud's disease with ulcerations. Patient prescribed sildenafil and nitro cream. She reports that when she took whole pill it affected her vision so she stopped taking completely. Patient reports an ulcer for awhile but that it got tender and larger about a week ago. She started doxycycline last Friday. She has been soaking in warm epson salt water, peroxide.).  Patient was prescribed doxycycline to use along with hydrocodone for right thumb. Since she has been taking antibiotics she states that it started feeling better. Patient reports she has been using some triple antiobiotic ointment  The following portions of the chart were reviewed this encounter and updated as appropriate:  Allergies  Meds  Problems  Med Hx  Surg Hx  Fam Hx        Objective  Well appearing patient in no apparent distress; mood and affect are within normal limits.  A focused examination was performed including hands and fingers. Relevant physical exam findings are noted in the Assessment and Plan.  Right Hand - Anterior 2 tender ulcers today at right thumb with purulent drainage Multiple digits shortened, skin tight and shiny   Right Hand - Anterior Pocket of purulent drainage at proximal ulcer  Right Proximal Thumb Erythema and edema  Assessment & Plan  Raynaud's disease with gangrene (HCC) Right Hand - Anterior  With digital ulcerations   Chronic condition with duration over one year. Condition is bothersome to patient. Currently flared with persistent ulcerations.   Will restart Sildenafil 10 mg 3 times per day. Monitor for lightheadedness, vision changes.   Restart nitro ointment three times per day to finger webs and base of fingers   Continue amlodipine per Dr. Jefm Bryant  Incision and Drainage - Right Hand - Anterior Abcess with cellulitis    Related Procedures Anaerobic/Aerobic/Gram Stain  Abscess Right Hand - Anterior  Incision and Drainage - Right Hand - Anterior Location: right thumb  Informed Consent: Discussed risks (permanent scarring, light or dark discoloration, infection, pain, bleeding, bruising, redness, damage to adjacent structures, and recurrence of the lesion) and benefits of the procedure, as well as the alternatives.  Informed consent was obtained.  Preparation: The area was prepped with chlorhexidine  Anesthesia: not required as overlying skin necrotic  Procedure Details: An incision was made overlying the lesion. The lesion drained pus.  A small amount of fluid was drained.    Antibiotic ointment and a sterile pressure dressing were applied. The patient tolerated procedure well.  Total number of lesions drained: 1  Plan: The patient was instructed on post-op care. Recommend OTC analgesia as needed for pain.   Cellulitis of finger of right hand Right Proximal Thumb  Has failed doxycycline Sulfa allergic With purulent discharge  Start clindamycin 300 mg 3 times per day for 7 days Recommend taking probiotic or eating yogurt while on this and for a few weeks after Discussed risk of C. difficile  Return for 1 week recheck on wound . I, Ruthell Rummage, CMA, am acting as scribe for Forest Gleason, MD.   Documentation: I have reviewed the above documentation for accuracy and completeness, and I agree with the above.  Forest Gleason, MD

## 2021-05-15 NOTE — Patient Instructions (Signed)

## 2021-05-16 DIAGNOSIS — E039 Hypothyroidism, unspecified: Secondary | ICD-10-CM | POA: Diagnosis not present

## 2021-05-16 DIAGNOSIS — I10 Essential (primary) hypertension: Secondary | ICD-10-CM | POA: Diagnosis not present

## 2021-05-16 DIAGNOSIS — Z862 Personal history of diseases of the blood and blood-forming organs and certain disorders involving the immune mechanism: Secondary | ICD-10-CM | POA: Diagnosis not present

## 2021-05-20 LAB — ANAEROBIC/AEROBIC/GRAM STAIN

## 2021-05-21 ENCOUNTER — Ambulatory Visit: Payer: Medicare HMO | Admitting: Dermatology

## 2021-05-21 ENCOUNTER — Other Ambulatory Visit: Payer: Self-pay

## 2021-05-21 DIAGNOSIS — L03011 Cellulitis of right finger: Secondary | ICD-10-CM

## 2021-05-21 DIAGNOSIS — I7301 Raynaud's syndrome with gangrene: Secondary | ICD-10-CM

## 2021-05-21 MED ORDER — DOXYCYCLINE MONOHYDRATE 100 MG PO CAPS: 100.0000 mg | ORAL_CAPSULE | Freq: Two times a day (BID) | ORAL | 0 refills | Status: AC

## 2021-05-21 MED ORDER — MUPIROCIN 2 % EX OINT: 1.0000 "application " | TOPICAL_OINTMENT | Freq: Three times a day (TID) | CUTANEOUS | 0 refills | Status: DC

## 2021-05-21 NOTE — Progress Notes (Signed)
   Follow-Up Visit   Subjective  [name] is a 75 y.o. female who presents for the following: Follow-up (Patient here for follow up for wound on right thumb. She reports she feels area is doing much better but still has some yellow crusting. ).  Patient was given prescription for clindamycin and mupirocin.  She reports she has been using triple antibiotic ointment on wound instead of mupirocin.    She is also taking 1/2 tablet sildenafil (10 mg) 3 times per day without side effects.  The following portions of the chart were reviewed this encounter and updated as appropriate:  Allergies  Meds  Problems  Med Hx  Surg Hx  Fam Hx      Objective  Well appearing patient in no apparent distress; mood and affect are within normal limits.  A focused examination was performed including right hand. Relevant physical exam findings are noted in the Assessment and Plan.  Right Hand - Anterior See photos Erythematous patch with 2 central ulcerations Improved from prior         Right Hand - Anterior   Assessment & Plan  Cellulitis of finger of right hand Right Hand - Anterior  Advised patient to use mupirocin apply 3 times per day to open areas   Also apply to thin layer to both nostrils  for 10 days   Complete clindamycin (1 day left)   Then restart doxycycline 100 mg capsule by mouth twice daily for 7 days given incomplete clearance with clindamycin  Recommend continuing yogurt daily  Recheck in 1 week, call for worsening   doxycycline (MONODOX) 100 MG capsule - Right Hand - Anterior Take 1 capsule (100 mg total) by mouth 2 (two) times daily for 7 days. Take with food  Raynaud's disease with gangrene ([place]) Right Hand - Anterior  Raynaud's with digital ulcerations   Chronic condition with duration over one year. Condition is bothersome to patient. Currently flared with persistent ulcerations and 2 new ulcerations   Continue Sildenafil 10 mg 3 times per day  (restarted last visit, tolerating well). Monitor for lightheadedness, vision changes.   Continue nitro ointment three times per day to finger webs and base of fingers   Continue amlodipine per Dr. Jefm Bryant  Related Medications mupirocin ointment (BACTROBAN) 2 % Apply 1 application topically 3 (three) times daily. Until healed. Also apply thin layer to nostrils daily for 10 days  Return for 1 week follow up on wound , 6 week follow up. I, Ruthell Rummage, CMA, am acting as scribe for Forest Gleason, MD.   Documentation: I have reviewed the above documentation for accuracy and completeness, and I agree with the above.  Forest Gleason, MD

## 2021-05-21 NOTE — Patient Instructions (Addendum)
Apply thin layer of  mupirocin antibiotic ointment to both nostrils  for 10 days   Apply mupirocin antiobotic ointment to wound 3 times daily until healed   Doxycycline should be taken with food to prevent nausea. Do not lay down for 30 minutes after taking. Be cautious with sun exposure and use good sun protection while on this medication. Pregnant women should not take this medication.       If you have any questions or concerns for your doctor, please call our main line at [phone] and press option 4 to reach your doctor's medical assistant. If no one answers, please leave a voicemail as directed and we will return your call as soon as possible. Messages left after 4 pm will be answered the following business day.   You may also send [place] a message via [place]. We typically respond to MyChart messages within 1-2 business days.  For prescription refills, please ask your pharmacy to contact our office. Our fax number is [phone].  If you have an urgent issue when the clinic is closed that cannot wait until the next business day, you can page your doctor at the number below.    Please note that while we do our best to be available for urgent issues outside of office hours, we are not available 24/7.   If you have an urgent issue and are unable to reach [place], you may choose to seek medical care at your doctor's office, retail clinic, urgent care center, or emergency room.  If you have a medical emergency, please immediately call 911 or go to the emergency department.  Pager Numbers  - Dr. Nehemiah Massed: [phone]  - Dr. Laurence Ferrari: [phone]  - Dr. Nicole Kindred: [phone]  In the event of inclement weather, please call our main line at [phone] for an update on the status of any delays or closures.  Dermatology Medication Tips: Please keep the boxes that topical medications come in in order to help keep track of the instructions about where and how to use these. Pharmacies  typically print the medication instructions only on the boxes and not directly on the medication tubes.   If your medication is too expensive, please contact our office at [phone] option 4 or send [place] a message through [place].   We are unable to tell what your co-pay for medications will be in advance as this is different depending on your insurance coverage. However, we may be able to find a substitute medication at lower cost or fill out paperwork to get insurance to cover a needed medication.   If a prior authorization is required to get your medication covered by your insurance company, please allow [place] 1-2 business days to complete this process.  Drug prices often vary depending on where the prescription is filled and some pharmacies may offer cheaper prices.  The website www.goodrx.com contains coupons for medications through different pharmacies. The prices here do not account for what the cost may be with help from insurance (it may be cheaper with your insurance), but the website can give you the price if you did not use any insurance.  - You can print the associated coupon and take it with your prescription to the pharmacy.  - You may also stop by our office during regular business hours and pick up a GoodRx coupon card.  - If you need your prescription sent electronically to a different pharmacy, notify our office through [facility] or by phone at [phone] option 4.

## 2021-05-23 DIAGNOSIS — D649 Anemia, unspecified: Secondary | ICD-10-CM | POA: Diagnosis not present

## 2021-05-23 DIAGNOSIS — Z136 Encounter for screening for cardiovascular disorders: Secondary | ICD-10-CM | POA: Diagnosis not present

## 2021-05-23 DIAGNOSIS — I10 Essential (primary) hypertension: Secondary | ICD-10-CM | POA: Diagnosis not present

## 2021-05-23 DIAGNOSIS — Z0000 Encounter for general adult medical examination without abnormal findings: Secondary | ICD-10-CM | POA: Diagnosis not present

## 2021-05-29 ENCOUNTER — Ambulatory Visit
Admission: RE | Admit: 2021-05-29 | Discharge: 2021-05-29 | Disposition: A | Discharge status: Home / Self Care | Payer: Medicare HMO | Source: Ambulatory Visit | Attending: Dermatology | Admitting: Dermatology

## 2021-05-29 ENCOUNTER — Ambulatory Visit: Payer: Medicare HMO | Admitting: Dermatology

## 2021-05-29 ENCOUNTER — Other Ambulatory Visit: Payer: Self-pay

## 2021-05-29 DIAGNOSIS — I7301 Raynaud's syndrome with gangrene: Secondary | ICD-10-CM

## 2021-05-29 DIAGNOSIS — L98491 Non-pressure chronic ulcer of skin of other sites limited to breakdown of skin: Secondary | ICD-10-CM | POA: Diagnosis not present

## 2021-05-29 DIAGNOSIS — L03113 Cellulitis of right upper limb: Secondary | ICD-10-CM | POA: Insufficient documentation

## 2021-05-29 DIAGNOSIS — B9562 Methicillin resistant Staphylococcus aureus infection as the cause of diseases classified elsewhere: Secondary | ICD-10-CM

## 2021-05-29 DIAGNOSIS — L98499 Non-pressure chronic ulcer of skin of other sites with unspecified severity: Secondary | ICD-10-CM | POA: Diagnosis not present

## 2021-05-29 MED ORDER — DOXYCYCLINE MONOHYDRATE 100 MG PO TABS: 100.0000 mg | ORAL_TABLET | Freq: Two times a day (BID) | ORAL | 0 refills | Status: DC

## 2021-05-29 NOTE — Patient Instructions (Signed)

## 2021-05-29 NOTE — Progress Notes (Signed)
   Follow-Up Visit   Subjective  [name] is a 75 y.o. female who presents for the following: MRSA (Of the right thumb - patient finishes her course of Doxycycline today and has 2 more days left of the Mupirocin 2% ointment. She hasn't noticed much of an improvement since starting treatment. ).  The following portions of the chart were reviewed this encounter and updated as appropriate:      Review of Systems:  No other skin or systemic complaints except as noted in HPI or Assessment and Plan.  Objective  Well appearing patient in no apparent distress; mood and affect are within normal limits.  A focused examination was performed including the right hand. Relevant physical exam findings are noted in the Assessment and Plan.  R thumb Ulceration 2.0 x 1.0 cm with yellow adherent exudate and surrounding mild erythema.         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 Continue Doxycycline 100mg  po BID x 14 days and Mupirocin 2% ointment TID to aa and inside of nose.  DG Hand Complete Right - R thumb  doxycycline (ADOXA) 100 MG tablet - R thumb Take 1 tablet (100 mg total) by mouth 2 (two) times daily. Take with food.  Return in about 2 weeks (around 06/12/2021) for follow up appointment with Dr. Laurence Ferrari.  Luther Redo, CMA, am acting as scribe for Brendolyn Patty, MD .  Documentation: I have reviewed the above documentation for accuracy and completeness, and I agree with the above.  Brendolyn Patty MD

## 2021-05-30 DIAGNOSIS — A4902 Methicillin resistant Staphylococcus aureus infection, unspecified site: Secondary | ICD-10-CM | POA: Diagnosis not present

## 2021-05-30 DIAGNOSIS — M349 Systemic sclerosis, unspecified: Secondary | ICD-10-CM | POA: Diagnosis not present

## 2021-06-03 ENCOUNTER — Telehealth: Payer: Self-pay

## 2021-06-03 ENCOUNTER — Encounter: Payer: Self-pay | Admitting: Dermatology

## 2021-06-03 NOTE — Telephone Encounter (Signed)
Left message on voicemail to return my call.  

## 2021-06-03 NOTE — Telephone Encounter (Signed)
-----   Message from Brendolyn Patty, MD sent at 06/03/2021 10:34 AM EDT ----- R Hand X-ray does not show osteomyelitis, continue wound care (mupirocin/cover) and oral Doxy and keep f/up appointment with Dr. Laurence Ferrari next week - please call pt

## 2021-06-03 NOTE — Telephone Encounter (Signed)
Advised pt of xray results.  Advised pt to con Mupirocin oint and cover with band aid and to continue taking Doxycycline./sh

## 2021-06-13 ENCOUNTER — Encounter: Payer: Self-pay | Admitting: Dermatology

## 2021-06-13 ENCOUNTER — Other Ambulatory Visit: Payer: Self-pay

## 2021-06-13 ENCOUNTER — Ambulatory Visit: Payer: Medicare HMO | Admitting: Dermatology

## 2021-06-13 DIAGNOSIS — I7301 Raynaud's syndrome with gangrene: Secondary | ICD-10-CM

## 2021-06-13 NOTE — Progress Notes (Signed)
   Follow-Up Visit   Subjective  [name] is a 75 y.o. female who presents for the following: Follow-up (Patient here today for 2 week follow up for ulcer at right thumb with culture proven MRSA. Patient finished doxycycline '100mg'$  twice daily for 14 days yesterday and is continuing to use mupirocin. Patient feels that ulcer has improved. ).  The following portions of the chart were reviewed this encounter and updated as appropriate:   Allergies  Meds  Problems  Med Hx  Surg Hx  Fam Hx      Review of Systems:  No other skin or systemic complaints except as noted in HPI or Assessment and Plan.  Objective  Well appearing patient in no apparent distress; mood and affect are within normal limits.  A focused examination was performed including right hand and thumb. Relevant physical exam findings are noted in the Assessment and Plan.  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 Condition is bothersome to patient. Not currently at goal.  Ulceration no longer appears infected.  Pt to call with any worsening.  Continue mupirocin to open areas and cover daily.  Continue Sildenafil 10 mg 3 times per day (Patient did not tolerate higher dose 20 mg 3 times daily but has done well with 10 mg daily). Will continue to monitor for lightheadedness, vision changes.   Continue nitro ointment increasing to three times per day to base of finger  Could consider changes to CCB if ulcer not continuing to improve at follow-up.   Related Medications mupirocin ointment (BACTROBAN) 2 % Apply 1 application topically 3 (three) times daily. Until healed. Also apply thin layer to nostrils daily for 10 days  Return for 3-4 weeks.  Graciella Belton, RMA, am acting as scribe for Forest Gleason, MD .   Documentation: I have reviewed the above documentation for accuracy and  completeness, and I agree with the above.  Forest Gleason, MD

## 2021-06-13 NOTE — Patient Instructions (Addendum)
Continue mupirocin to open areas and cover  Continue Sildenafil 10 mg 3 times per day (restarted last visit, tolerating well). Monitor for lightheadedness, vision changes.   Continue nitro ointment three times per day to base of fingers  If you have any questions or concerns for your doctor, please call our main line at [phone] and press option 4 to reach your doctor's medical assistant. If no one answers, please leave a voicemail as directed and we will return your call as soon as possible. Messages left after 4 pm will be answered the following business day.   You may also send [place] a message via [place]. We typically respond to MyChart messages within 1-2 business days.  For prescription refills, please ask your pharmacy to contact our office. Our fax number is [phone].  If you have an urgent issue when the clinic is closed that cannot wait until the next business day, you can page your doctor at the number below.    Please note that while we do our best to be available for urgent issues outside of office hours, we are not available 24/7.   If you have an urgent issue and are unable to reach [place], you may choose to seek medical care at your doctor's office, retail clinic, urgent care center, or emergency room.  If you have a medical emergency, please immediately call 911 or go to the emergency department.  Pager Numbers  - Dr. Nehemiah Massed: [phone]  - Dr. Laurence Ferrari: [phone]  - Dr. Nicole Kindred: [phone]  In the event of inclement weather, please call our main line at [phone] for an update on the status of any delays or closures.  Dermatology Medication Tips: Please keep the boxes that topical medications come in in order to help keep track of the instructions about where and how to use these. Pharmacies typically print the medication instructions only on the boxes and not directly on the medication tubes.   If your medication is too expensive, please contact our office  at [phone] option 4 or send [place] a message through [place].   We are unable to tell what your co-pay for medications will be in advance as this is different depending on your insurance coverage. However, we may be able to find a substitute medication at lower cost or fill out paperwork to get insurance to cover a needed medication.   If a prior authorization is required to get your medication covered by your insurance company, please allow [place] 1-2 business days to complete this process.  Drug prices often vary depending on where the prescription is filled and some pharmacies may offer cheaper prices.  The website www.goodrx.com contains coupons for medications through different pharmacies. The prices here do not account for what the cost may be with help from insurance (it may be cheaper with your insurance), but the website can give you the price if you did not use any insurance.  - You can print the associated coupon and take it with your prescription to the pharmacy.  - You may also stop by our office during regular business hours and pick up a GoodRx coupon card.  - If you need your prescription sent electronically to a different pharmacy, notify our office through [facility] or by phone at [phone] option 4.

## 2021-06-23 ENCOUNTER — Encounter: Payer: Self-pay | Admitting: Dermatology

## 2021-07-04 ENCOUNTER — Ambulatory Visit: Payer: Medicare HMO | Admitting: Dermatology

## 2021-07-04 ENCOUNTER — Other Ambulatory Visit: Payer: Self-pay

## 2021-07-04 DIAGNOSIS — I7301 Raynaud's syndrome with gangrene: Secondary | ICD-10-CM

## 2021-07-04 NOTE — Progress Notes (Addendum)
   Follow-Up Visit   Subjective  [name] is a 75 y.o. female who presents for the following: Follow-up (Patient here today for 3 week follow up for ulcer at right thumb. Patient currently using mupirocin daily, nitro ointment and taking sildenafil '10mg'$  TID. Patient advises ulcer has improved. ).   The following portions of the chart were reviewed this encounter and updated as appropriate:   Allergies  Meds  Problems  Med Hx  Surg Hx  Fam Hx      Review of Systems:  No other skin or systemic complaints except as noted in HPI or Assessment and Plan.  Objective  Well appearing patient in no apparent distress; mood and affect are within normal limits.  A focused examination was performed including right hand and thumb. Relevant physical exam findings are noted in the Assessment and Plan.  right thumb See photos      Assessment & Plan  Raynaud's disease with gangrene ([place]) right thumb  Chronic condition with duration or expected duration over one year. Currently well-controlled. Current ulceration healing. No new ulcerations.  Continue mupirocin daily and cover.  Continue sildenafil '10mg'$  TID (Patient did not tolerate higher dose 20 mg 3 times daily but has done well with 10 mg daily). Will continue to monitor for lightheadedness, vision changes. Continue nitro ointment to base of finger TID  Monitor for signs of infection - increased pain, spreading redness, drainage.  Related Medications mupirocin ointment (BACTROBAN) 2 % Apply 1 application topically 3 (three) times daily. Until healed. Also apply thin layer to nostrils daily for 10 days  Return for as scheduled.  Graciella Belton, RMA, am acting as scribe for Forest Gleason, MD .  Documentation: I have reviewed the above documentation for accuracy and completeness, and I agree with the above.  Forest Gleason, MD

## 2021-07-04 NOTE — Patient Instructions (Signed)

## 2021-07-08 ENCOUNTER — Encounter: Payer: Self-pay | Admitting: Dermatology

## 2021-07-10 DIAGNOSIS — Z79899 Other long term (current) drug therapy: Secondary | ICD-10-CM | POA: Diagnosis not present

## 2021-07-10 DIAGNOSIS — M349 Systemic sclerosis, unspecified: Secondary | ICD-10-CM | POA: Diagnosis not present

## 2021-07-16 DIAGNOSIS — I7300 Raynaud's syndrome without gangrene: Secondary | ICD-10-CM | POA: Diagnosis not present

## 2021-07-16 DIAGNOSIS — I272 Pulmonary hypertension, unspecified: Secondary | ICD-10-CM | POA: Diagnosis not present

## 2021-07-16 DIAGNOSIS — M349 Systemic sclerosis, unspecified: Secondary | ICD-10-CM | POA: Diagnosis not present

## 2021-07-16 DIAGNOSIS — A4902 Methicillin resistant Staphylococcus aureus infection, unspecified site: Secondary | ICD-10-CM | POA: Diagnosis not present

## 2021-07-23 DIAGNOSIS — M898X9 Other specified disorders of bone, unspecified site: Secondary | ICD-10-CM | POA: Diagnosis not present

## 2021-07-23 DIAGNOSIS — M2041 Other hammer toe(s) (acquired), right foot: Secondary | ICD-10-CM | POA: Diagnosis not present

## 2021-07-23 DIAGNOSIS — M2042 Other hammer toe(s) (acquired), left foot: Secondary | ICD-10-CM | POA: Diagnosis not present

## 2021-07-23 DIAGNOSIS — M778 Other enthesopathies, not elsewhere classified: Secondary | ICD-10-CM | POA: Diagnosis not present

## 2021-07-23 DIAGNOSIS — B351 Tinea unguium: Secondary | ICD-10-CM | POA: Diagnosis not present

## 2021-07-23 DIAGNOSIS — L600 Ingrowing nail: Secondary | ICD-10-CM | POA: Diagnosis not present

## 2021-07-23 DIAGNOSIS — M79674 Pain in right toe(s): Secondary | ICD-10-CM | POA: Diagnosis not present

## 2021-07-23 DIAGNOSIS — M79675 Pain in left toe(s): Secondary | ICD-10-CM | POA: Diagnosis not present

## 2021-07-31 DIAGNOSIS — H6121 Impacted cerumen, right ear: Secondary | ICD-10-CM | POA: Diagnosis not present

## 2021-08-01 ENCOUNTER — Other Ambulatory Visit: Payer: Self-pay

## 2021-08-01 ENCOUNTER — Ambulatory Visit: Payer: Medicare HMO | Admitting: Dermatology

## 2021-08-01 DIAGNOSIS — L814 Other melanin hyperpigmentation: Secondary | ICD-10-CM

## 2021-08-01 DIAGNOSIS — L578 Other skin changes due to chronic exposure to nonionizing radiation: Secondary | ICD-10-CM | POA: Diagnosis not present

## 2021-08-01 DIAGNOSIS — Z1283 Encounter for screening for malignant neoplasm of skin: Secondary | ICD-10-CM | POA: Diagnosis not present

## 2021-08-01 DIAGNOSIS — L570 Actinic keratosis: Secondary | ICD-10-CM | POA: Diagnosis not present

## 2021-08-01 DIAGNOSIS — Z85828 Personal history of other malignant neoplasm of skin: Secondary | ICD-10-CM | POA: Diagnosis not present

## 2021-08-01 DIAGNOSIS — B079 Viral wart, unspecified: Secondary | ICD-10-CM | POA: Diagnosis not present

## 2021-08-01 DIAGNOSIS — D485 Neoplasm of uncertain behavior of skin: Secondary | ICD-10-CM

## 2021-08-01 DIAGNOSIS — Z86006 Personal history of melanoma in-situ: Secondary | ICD-10-CM | POA: Diagnosis not present

## 2021-08-01 DIAGNOSIS — D229 Melanocytic nevi, unspecified: Secondary | ICD-10-CM | POA: Diagnosis not present

## 2021-08-01 DIAGNOSIS — L821 Other seborrheic keratosis: Secondary | ICD-10-CM

## 2021-08-01 DIAGNOSIS — D1800 Hemangioma unspecified site: Secondary | ICD-10-CM

## 2021-08-01 DIAGNOSIS — Z86018 Personal history of other benign neoplasm: Secondary | ICD-10-CM

## 2021-08-01 DIAGNOSIS — M349 Systemic sclerosis, unspecified: Secondary | ICD-10-CM

## 2021-08-01 DIAGNOSIS — B078 Other viral warts: Secondary | ICD-10-CM | POA: Diagnosis not present

## 2021-08-01 NOTE — Progress Notes (Signed)
Follow-Up Visit   Subjective  [name] is a 75 y.o. female who presents for the following: FBSE (Patient here for full body skin exam and skin cancer screening. 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 She is not aware of any new or changing spots. ).  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 Patient recently treated for an ulcer at right thumb which has improved per patient.   The following portions of the chart were reviewed this encounter and updated as appropriate:   Allergies  Meds  Problems  Med Hx  Surg Hx  Fam Hx      Review of Systems:  No other skin or systemic complaints except as noted in HPI or Assessment and Plan.  Objective  Well appearing patient in no apparent distress; mood and affect are within normal limits.  A full examination was performed including scalp, head, eyes, ears, nose, lips, neck, chest, axillae, abdomen, back, buttocks, bilateral upper extremities, bilateral lower extremities, hands, feet, fingers, toes, fingernails, and toenails. All findings within normal limits unless otherwise noted below.  Left Upper Arm 0.6cm thick scaly skin colored papule R/o SCC vs SK       Left Hand - Anterior Fingers with taut shiny skin and loss of fingertips at multiple digits, single healing ulceration  Left Upper Abdomen Verrucous papules -- Discussed viral etiology and contagion.   Left Anterior Thigh x 1, left cheek x 1, left cheek x 2 (4) Erythematous thin papules/macules with gritty scale.    Assessment & Plan  Neoplasm of uncertain behavior of skin Left Upper Arm  Skin / nail biopsy Type of biopsy: tangential   Informed consent: discussed and consent obtained   Timeout: patient name, date of birth, surgical site, and procedure verified   Patient was prepped and draped in usual sterile fashion: Area prepped with isopropyl alcohol. 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 Condition is bothersome to patient. Not currently at goal.  Improving with current regimen.  Continue sildenafil '10mg'$  TID (Patient did not tolerate higher dose 20 mg 3 times daily but has done well with 10 mg daily). Will continue to monitor for lightheadedness, vision changes. Continue nitro ointment to web spaces TID as prescribed by Dr. Jefm Bryant Continue amlodipine per Dr. Jefm Bryant  Monitor for worsening of ulcer, spreading redness, increased drainage, tenderness  Related Medications mupirocin ointment (BACTROBAN) 2 % Apply 1 application topically daily. To open areas  Other viral warts Left Upper Abdomen  Discussed viral etiology and risk of spread.  Discussed multiple treatments may be required to clear warts.  Discussed possible post-treatment dyspigmentation and risk of recurrence.  Prior to procedure, discussed risks of blister formation, small wound, skin dyspigmentation, or rare scar following cryotherapy. Recommend Vaseline ointment to treated areas while healing.   Destruction of lesion - Left Upper Abdomen  Destruction method: cryotherapy   Informed consent: discussed and consent  obtained   Lesion destroyed using liquid nitrogen: Yes   Cryotherapy cycles:  2 Outcome: patient tolerated procedure well with no complications   Post-procedure details: wound care instructions given    AK (actinic  keratosis) (4) Left Anterior Thigh x 1, left cheek x 1, left cheek x 2  Prior to procedure, discussed risks of blister formation, small wound, skin dyspigmentation, or rare scar following cryotherapy. Recommend Vaseline ointment to treated areas while healing.   Destruction of lesion - Left Anterior Thigh x 1, left cheek x 1, left cheek x 2  Destruction method: cryotherapy   Informed consent: discussed and consent obtained   Lesion destroyed using liquid nitrogen: Yes   Cryotherapy cycles:  2 Outcome: patient tolerated procedure well with no complications   Post-procedure details: wound care instructions given    Lentigines - Scattered tan macules - Due to sun exposure - Benign-appearing, observe - Recommend daily broad spectrum sunscreen SPF 30+ to sun-exposed areas, reapply every 2 hours as needed. - Call for any changes  Seborrheic Keratoses - Stuck-on, waxy, tan-brown papules and/or plaques  - Benign-appearing - Discussed benign etiology and prognosis. - Observe - Call for any changes  Melanocytic Nevi - Tan-brown and/or pink-flesh-colored symmetric macules and papules - Benign appearing on exam today - Observation - Call clinic for new or changing moles - Recommend daily use of broad spectrum spf 30+ sunscreen to sun-exposed areas.   Hemangiomas - Red papules - Discussed benign nature - Observe - Call for any changes  Actinic Damage - Chronic condition, secondary to cumulative UV/sun exposure - diffuse scaly erythematous macules with underlying dyspigmentation - Recommend daily broad spectrum sunscreen SPF 30+ to sun-exposed areas, reapply every 2 hours as needed.  - Staying in the shade or wearing long sleeves, sun glasses (UVA+UVB protection) and wide brim hats (4-inch brim around the entire circumference of the hat) are also recommended for sun protection.  - Call for new or changing lesions.  Skin cancer screening performed today.  Return for 3-4 months, AK  follow up and Raynaud's, 9 month FBSE.  Graciella Belton, RMA, am acting as scribe for Forest Gleason, MD .  Documentation: I have reviewed the above documentation for accuracy and completeness, and I agree with the above.  Forest Gleason, MD

## 2021-08-01 NOTE — Patient Instructions (Addendum)
Wound Care Instructions  Cleanse wound gently with soap and water once a day then pat dry with clean gauze. Apply a thing coat of Petrolatum (petroleum jelly, "Vaseline") over the wound (unless you have an allergy to this). We recommend that you use a new, sterile tube of Vaseline. Do not pick or remove scabs. Do not remove the yellow or white "healing tissue" from the base of the wound.  Cover the wound with fresh, clean, nonstick gauze and secure with paper tape. You may use Band-Aids in place of gauze and tape if the would is small enough, but would recommend trimming much of the tape off as there is often too much. Sometimes Band-Aids can irritate the skin.  You should call the office for your biopsy report after 1 week if you have not already been contacted.  If you experience any problems, such as abnormal amounts of bleeding, swelling, significant bruising, significant pain, or evidence of infection, please call the office immediately.  FOR ADULT SURGERY PATIENTS: If you need something for pain relief you may take 1 extra strength Tylenol (acetaminophen) AND 2 Ibuprofen ('200mg'$  each) together every 4 hours as needed for pain. (do not take these if you are allergic to them or if you have a reason you should not take them.) Typically, you may only need pain medication for 1 to 3 days.    Cryotherapy Aftercare  Wash gently with soap and water everyday.   Apply Vaseline and Band-Aid daily until healed.   Prior to procedure, discussed risks of blister formation, small wound, skin dyspigmentation, or rare scar following cryotherapy. Recommend Vaseline ointment to treated areas while healing.  Recommend daily broad spectrum sunscreen SPF 30+ to sun-exposed areas, reapply every 2 hours as needed. Call for new or changing lesions.  Staying in the shade or wearing long sleeves, sun glasses (UVA+UVB protection) and wide brim hats (4-inch brim around the entire circumference of the hat) are also  recommended for sun protection.   If you have any questions or concerns for your doctor, please call our main line at [phone] and press option 4 to reach your doctor's medical assistant. If no one answers, please leave a voicemail as directed and we will return your call as soon as possible. Messages left after 4 pm will be answered the following business day.   You may also send [place] a message via [place]. We typically respond to MyChart messages within 1-2 business days.  For prescription refills, please ask your pharmacy to contact our office. Our fax number is [phone].  If you have an urgent issue when the clinic is closed that cannot wait until the next business day, you can page your doctor at the number below.    Please note that while we do our best to be available for urgent issues outside of office hours, we are not available 24/7.   If you have an urgent issue and are unable to reach [place], you may choose to seek medical care at your doctor's office, retail clinic, urgent care center, or emergency room.  If you have a medical emergency, please immediately call 911 or go to the emergency department.  Pager Numbers  - Dr. Nehemiah Massed: [phone]  - Dr. Laurence Ferrari: [phone]  - Dr. Nicole Kindred: [phone]  In the event of inclement weather, please call our main line at [phone] for an update on the status of any delays or closures.  Dermatology Medication Tips: Please keep the boxes that topical medications come in in  order to help keep track of the instructions about where and how to use these. Pharmacies typically print the medication instructions only on the boxes and not directly on the medication tubes.   If your medication is too expensive, please contact our office at [phone] option 4 or send [place] a message through [place].   We are unable to tell what your co-pay for medications will be in advance as this is different depending on your insurance coverage. However,  we may be able to find a substitute medication at lower cost or fill out paperwork to get insurance to cover a needed medication.   If a prior authorization is required to get your medication covered by your insurance company, please allow [place] 1-2 business days to complete this process.  Drug prices often vary depending on where the prescription is filled and some pharmacies may offer cheaper prices.  The website www.goodrx.com contains coupons for medications through different pharmacies. The prices here do not account for what the cost may be with help from insurance (it may be cheaper with your insurance), but the website can give you the price if you did not use any insurance.  - You can print the associated coupon and take it with your prescription to the pharmacy.  - You may also stop by our office during regular business hours and pick up a GoodRx coupon card.  - If you need your prescription sent electronically to a different pharmacy, notify our office through [facility] or by phone at [phone] option 4.

## 2021-08-06 ENCOUNTER — Telehealth: Payer: Self-pay

## 2021-08-06 DIAGNOSIS — I495 Sick sinus syndrome: Secondary | ICD-10-CM | POA: Diagnosis not present

## 2021-08-06 NOTE — Telephone Encounter (Signed)
-----   Message from [place], MD sent at 08/06/2021 10:09 AM EDT ----- Skin , left upper arm VERRUCA VULGARIS, IRRITATED  This is a WART caused by the human papilloma virus. It is not dangerous but is contagious and can spread to other areas of skin or other people if it is not completely gone. No additional treatment is needed. However, if it comes back, we can freeze it in clinic with liquid nitrogen (a quick in office procedure) or you can also treat it at home with an over the counter salicylic wart treatment (slower).  Please call the office at [phone] or message [place] if you have have any questions.  MAs please call. Thank you!

## 2021-08-07 ENCOUNTER — Telehealth: Payer: Self-pay

## 2021-08-07 NOTE — Telephone Encounter (Signed)
-----   Message from [place], MD sent at 08/06/2021 10:09 AM EDT ----- Skin , left upper arm VERRUCA VULGARIS, IRRITATED  This is a WART caused by the human papilloma virus. It is not dangerous but is contagious and can spread to other areas of skin or other people if it is not completely gone. No additional treatment is needed. However, if it comes back, we can freeze it in clinic with liquid nitrogen (a quick in office procedure) or you can also treat it at home with an over the counter salicylic wart treatment (slower).  Please call the office at [phone] or message [place] if you have have any questions.  MAs please call. Thank you!

## 2021-08-12 ENCOUNTER — Encounter: Payer: Self-pay | Admitting: Dermatology

## 2021-08-26 DIAGNOSIS — Z23 Encounter for immunization: Secondary | ICD-10-CM | POA: Diagnosis not present

## 2021-10-01 DIAGNOSIS — E079 Disorder of thyroid, unspecified: Secondary | ICD-10-CM | POA: Diagnosis not present

## 2021-10-01 DIAGNOSIS — Z45010 Encounter for checking and testing of cardiac pacemaker pulse generator [battery]: Secondary | ICD-10-CM | POA: Diagnosis not present

## 2021-10-01 DIAGNOSIS — I272 Pulmonary hypertension, unspecified: Secondary | ICD-10-CM | POA: Diagnosis not present

## 2021-10-01 DIAGNOSIS — D649 Anemia, unspecified: Secondary | ICD-10-CM | POA: Diagnosis not present

## 2021-10-01 DIAGNOSIS — I495 Sick sinus syndrome: Secondary | ICD-10-CM | POA: Diagnosis not present

## 2021-10-01 DIAGNOSIS — K219 Gastro-esophageal reflux disease without esophagitis: Secondary | ICD-10-CM | POA: Diagnosis not present

## 2021-10-01 DIAGNOSIS — I10 Essential (primary) hypertension: Secondary | ICD-10-CM | POA: Diagnosis not present

## 2021-10-02 DIAGNOSIS — M898X9 Other specified disorders of bone, unspecified site: Secondary | ICD-10-CM | POA: Diagnosis not present

## 2021-10-02 DIAGNOSIS — M2041 Other hammer toe(s) (acquired), right foot: Secondary | ICD-10-CM | POA: Diagnosis not present

## 2021-10-02 DIAGNOSIS — M2042 Other hammer toe(s) (acquired), left foot: Secondary | ICD-10-CM | POA: Diagnosis not present

## 2021-10-16 DIAGNOSIS — I272 Pulmonary hypertension, unspecified: Secondary | ICD-10-CM | POA: Diagnosis not present

## 2021-10-16 DIAGNOSIS — I7300 Raynaud's syndrome without gangrene: Secondary | ICD-10-CM | POA: Diagnosis not present

## 2021-10-16 DIAGNOSIS — A4902 Methicillin resistant Staphylococcus aureus infection, unspecified site: Secondary | ICD-10-CM | POA: Diagnosis not present

## 2021-10-16 DIAGNOSIS — M349 Systemic sclerosis, unspecified: Secondary | ICD-10-CM | POA: Diagnosis not present

## 2021-10-18 DIAGNOSIS — I495 Sick sinus syndrome: Secondary | ICD-10-CM | POA: Diagnosis not present

## 2021-10-18 DIAGNOSIS — I517 Cardiomegaly: Secondary | ICD-10-CM | POA: Diagnosis not present

## 2021-10-18 DIAGNOSIS — Z01818 Encounter for other preprocedural examination: Secondary | ICD-10-CM | POA: Diagnosis not present

## 2021-10-18 DIAGNOSIS — Z45010 Encounter for checking and testing of cardiac pacemaker pulse generator [battery]: Secondary | ICD-10-CM | POA: Diagnosis not present

## 2021-10-23 DIAGNOSIS — L600 Ingrowing nail: Secondary | ICD-10-CM | POA: Diagnosis not present

## 2021-10-23 DIAGNOSIS — M2042 Other hammer toe(s) (acquired), left foot: Secondary | ICD-10-CM | POA: Diagnosis not present

## 2021-10-23 DIAGNOSIS — M2041 Other hammer toe(s) (acquired), right foot: Secondary | ICD-10-CM | POA: Diagnosis not present

## 2021-10-23 DIAGNOSIS — M898X9 Other specified disorders of bone, unspecified site: Secondary | ICD-10-CM | POA: Diagnosis not present

## 2021-10-23 DIAGNOSIS — M778 Other enthesopathies, not elsewhere classified: Secondary | ICD-10-CM | POA: Diagnosis not present

## 2021-10-23 DIAGNOSIS — B351 Tinea unguium: Secondary | ICD-10-CM | POA: Diagnosis not present

## 2021-10-29 ENCOUNTER — Other Ambulatory Visit: Payer: Self-pay

## 2021-10-29 ENCOUNTER — Encounter: Payer: Self-pay | Admitting: Cardiology

## 2021-10-29 ENCOUNTER — Encounter
Admission: RE | Disposition: A | Discharge status: Home / Self Care | Payer: Self-pay | Source: Home / Self Care | Attending: Cardiology

## 2021-10-29 ENCOUNTER — Ambulatory Visit
Admission: RE | Admit: 2021-10-29 | Discharge: 2021-10-29 | Disposition: A | Discharge status: Home / Self Care | Payer: Medicare HMO | Attending: Cardiology | Admitting: Cardiology

## 2021-10-29 DIAGNOSIS — Z45010 Encounter for checking and testing of cardiac pacemaker pulse generator [battery]: Secondary | ICD-10-CM | POA: Diagnosis not present

## 2021-10-29 DIAGNOSIS — I442 Atrioventricular block, complete: Secondary | ICD-10-CM | POA: Diagnosis not present

## 2021-10-29 DIAGNOSIS — Z45018 Encounter for adjustment and management of other part of cardiac pacemaker: Secondary | ICD-10-CM

## 2021-10-29 HISTORY — PX: PPM GENERATOR CHANGEOUT: EP1233

## 2021-10-29 SURGERY — PPM GENERATOR CHANGEOUT
Anesthesia: Moderate Sedation

## 2021-10-29 MED ORDER — LIDOCAINE HCL 1 % IJ SOLN
INTRAMUSCULAR | Status: AC
  Filled 2021-10-29: qty 40

## 2021-10-29 MED ORDER — FENTANYL CITRATE (PF) 100 MCG/2ML IJ SOLN
INTRAMUSCULAR | Status: AC
  Filled 2021-10-29: qty 2

## 2021-10-29 MED ORDER — HEPARIN (PORCINE) IN NACL 1000-0.9 UT/500ML-% IV SOLN
INTRAVENOUS | Status: AC
  Filled 2021-10-29: qty 1000

## 2021-10-29 MED ORDER — ACETAMINOPHEN 325 MG PO TABS: 325.0000 mg | ORAL_TABLET | ORAL | Status: DC | PRN

## 2021-10-29 MED ORDER — VANCOMYCIN HCL IN DEXTROSE 1-5 GM/200ML-% IV SOLN
1000.0000 mg | INTRAVENOUS | Status: AC
  Administered 2021-10-29: 1000 mg via INTRAVENOUS
  Filled 2021-10-29: qty 200

## 2021-10-29 MED ORDER — SODIUM CHLORIDE 0.9 % IV SOLN: INTRAVENOUS | Status: DC

## 2021-10-29 MED ORDER — LIDOCAINE HCL (PF) 1 % IJ SOLN
INTRAMUSCULAR | Status: DC | PRN
  Administered 2021-10-29: 40 mL

## 2021-10-29 MED ORDER — SODIUM CHLORIDE 0.9 % IV SOLN
80.0000 mg | INTRAVENOUS | Status: AC
  Administered 2021-10-29: 80 mg
  Filled 2021-10-29: qty 80

## 2021-10-29 MED ORDER — ONDANSETRON HCL 4 MG/2ML IJ SOLN: 4.0000 mg | Freq: Four times a day (QID) | INTRAMUSCULAR | Status: DC | PRN

## 2021-10-29 MED ORDER — MIDAZOLAM HCL 2 MG/2ML IJ SOLN
INTRAMUSCULAR | Status: AC
  Filled 2021-10-29: qty 2

## 2021-10-29 MED ORDER — FENTANYL CITRATE (PF) 100 MCG/2ML IJ SOLN
INTRAMUSCULAR | Status: DC | PRN
  Administered 2021-10-29: 25 ug via INTRAVENOUS

## 2021-10-29 MED ORDER — MIDAZOLAM HCL 2 MG/2ML IJ SOLN
INTRAMUSCULAR | Status: DC | PRN
  Administered 2021-10-29: 1 mg via INTRAVENOUS

## 2021-10-29 SURGICAL SUPPLY — 16 items
CABLE SURG 12 DISP A/V CHANNEL (MISCELLANEOUS) ×5 IMPLANT
COVER SURGICAL LIGHT HANDLE (MISCELLANEOUS) ×2 IMPLANT
DEVICE DSSCT PLSMBLD 3.0S LGHT (MISCELLANEOUS) IMPLANT
DRAPE INCISE 23X17 IOBAN STRL (DRAPES) ×2
DRAPE INCISE 23X17 STRL (DRAPES) IMPLANT
DRAPE INCISE IOBAN 23X17 STRL (DRAPES) ×1 IMPLANT
IPG PACE AZUR XT DR MRI W1DR01 (Pacemaker) IMPLANT
PACE AZURE XT DR MRI W1DR01 (Pacemaker) ×3 IMPLANT
PAD ELECT DEFIB RADIOL ZOLL (MISCELLANEOUS) ×5 IMPLANT
PLASMABLADE 3.0S W/LIGHT (MISCELLANEOUS) ×3
POUCH AIGIS-R ANTIBACT PPM (Mesh General) ×3 IMPLANT
POUCH AIGIS-R ANTIBACT PPM MED (Mesh General) IMPLANT
SUT VIC AB 2-0 CT1 27 (SUTURE) ×2
SUT VIC AB 2-0 CT1 TAPERPNT 27 (SUTURE) IMPLANT
SUT VIC AB 4-0 PS2 18 (SUTURE) ×2 IMPLANT
TRAY PACEMAKER INSERTION (PACKS) ×3 IMPLANT

## 2021-10-29 NOTE — Discharge Instructions (Addendum)
Patient removed outer bandage, leave Steri-Strips on, may shower 10/31/2021.  Dr Saralyn Pilar will call in an antibiotic prescription to your pharmacy.

## 2021-10-30 ENCOUNTER — Encounter: Payer: Self-pay | Admitting: Cardiology

## 2021-11-07 DIAGNOSIS — I071 Rheumatic tricuspid insufficiency: Secondary | ICD-10-CM | POA: Diagnosis not present

## 2021-11-07 DIAGNOSIS — I272 Pulmonary hypertension, unspecified: Secondary | ICD-10-CM | POA: Diagnosis not present

## 2021-11-07 DIAGNOSIS — I350 Nonrheumatic aortic (valve) stenosis: Secondary | ICD-10-CM | POA: Diagnosis not present

## 2021-11-07 DIAGNOSIS — Z950 Presence of cardiac pacemaker: Secondary | ICD-10-CM | POA: Diagnosis not present

## 2021-11-07 DIAGNOSIS — I10 Essential (primary) hypertension: Secondary | ICD-10-CM | POA: Diagnosis not present

## 2021-11-07 DIAGNOSIS — I495 Sick sinus syndrome: Secondary | ICD-10-CM | POA: Diagnosis not present

## 2021-11-19 DIAGNOSIS — E039 Hypothyroidism, unspecified: Secondary | ICD-10-CM | POA: Diagnosis not present

## 2021-11-19 DIAGNOSIS — Z136 Encounter for screening for cardiovascular disorders: Secondary | ICD-10-CM | POA: Diagnosis not present

## 2021-11-19 DIAGNOSIS — I10 Essential (primary) hypertension: Secondary | ICD-10-CM | POA: Diagnosis not present

## 2021-11-19 DIAGNOSIS — Z862 Personal history of diseases of the blood and blood-forming organs and certain disorders involving the immune mechanism: Secondary | ICD-10-CM | POA: Diagnosis not present

## 2021-11-26 DIAGNOSIS — Z0000 Encounter for general adult medical examination without abnormal findings: Secondary | ICD-10-CM | POA: Diagnosis not present

## 2021-11-26 DIAGNOSIS — I10 Essential (primary) hypertension: Secondary | ICD-10-CM | POA: Diagnosis not present

## 2021-11-26 DIAGNOSIS — E039 Hypothyroidism, unspecified: Secondary | ICD-10-CM | POA: Diagnosis not present

## 2021-11-26 DIAGNOSIS — Z862 Personal history of diseases of the blood and blood-forming organs and certain disorders involving the immune mechanism: Secondary | ICD-10-CM | POA: Diagnosis not present

## 2021-11-26 DIAGNOSIS — Z1389 Encounter for screening for other disorder: Secondary | ICD-10-CM | POA: Diagnosis not present

## 2021-12-05 ENCOUNTER — Ambulatory Visit: Payer: Medicare HMO | Admitting: Dermatology

## 2021-12-05 ENCOUNTER — Other Ambulatory Visit: Payer: Self-pay

## 2021-12-05 DIAGNOSIS — L570 Actinic keratosis: Secondary | ICD-10-CM

## 2021-12-05 DIAGNOSIS — Z872 Personal history of diseases of the skin and subcutaneous tissue: Secondary | ICD-10-CM

## 2021-12-05 DIAGNOSIS — I7301 Raynaud's syndrome with gangrene: Secondary | ICD-10-CM

## 2021-12-05 DIAGNOSIS — B354 Tinea corporis: Secondary | ICD-10-CM | POA: Diagnosis not present

## 2021-12-05 DIAGNOSIS — S61301A Unspecified open wound of left index finger with damage to nail, initial encounter: Secondary | ICD-10-CM | POA: Diagnosis not present

## 2021-12-05 DIAGNOSIS — T148XXA Other injury of unspecified body region, initial encounter: Secondary | ICD-10-CM

## 2021-12-05 MED ORDER — CICLOPIROX OLAMINE 0.77 % EX CREA: TOPICAL_CREAM | Freq: Every day | CUTANEOUS | 1 refills | Status: DC

## 2021-12-05 NOTE — Progress Notes (Signed)
Follow-Up Visit   Subjective  [name] is a 76 y.o. female who presents for the following: Follow-up (Patient here today for 4 month AK and scleroderma with Raynauds follow up. Patient advises AK's at left thigh and cheeks resolved after LN2. She is taking sildenafil 10 mg three times daily and amlodipine as prescribed by Dr. Jefm Bryant. Patient no longer using nitro ointment at web spaces. Hands are doing better.).  Patient does have a spot above left brow, present for a while and is aggravating.   The following portions of the chart were reviewed this encounter and updated as appropriate:   Tobacco   Allergies   Meds   Problems   Med Hx   Surg Hx   Fam Hx       Review of Systems:  No other skin or systemic complaints except as noted in HPI or Assessment and Plan.  Objective  Well appearing patient in no apparent distress; mood and affect are within normal limits.  A focused examination was performed including left thigh, face, hands. Relevant physical exam findings are noted in the Assessment and Plan.  left 2nd finger Small open wound   right temple x 1, left brow x 1 (2) Erythematous thin papules/macules with gritty scale.   Left Thigh Erythematous patch with scaly border  Right Hand - Anterior Loss of multiple distal fingertips and small ulceration    Assessment & Plan  Open wound left 2nd finger  Recommend wound care - wash daily with soap and water and follow with mupirocin or Vaseline daily until healed  AK (actinic keratosis) (2) right temple x 1, left brow x 1  Prior to procedure, discussed risks of blister formation, small wound, skin dyspigmentation, or rare scar following cryotherapy. Recommend Vaseline ointment to treated areas while healing.  Actinic keratoses are precancerous spots that appear secondary to cumulative UV radiation exposure/sun exposure over time. They are chronic with expected duration over 1 year. A portion of actinic keratoses will  progress to squamous cell carcinoma of the skin. It is not possible to reliably predict which spots will progress to skin cancer and so treatment is recommended to prevent development of skin cancer.  Recommend daily broad spectrum sunscreen SPF 30+ to sun-exposed areas, reapply every 2 hours as needed.  Recommend staying in the shade or wearing long sleeves, sun glasses (UVA+UVB protection) and wide brim hats (4-inch brim around the entire circumference of the hat). Call for new or changing lesions.    Destruction of lesion - right temple x 1, left brow x 1  Destruction method: cryotherapy   Informed consent: discussed and consent obtained   Lesion destroyed using liquid nitrogen: Yes   Cryotherapy cycles:  2 Outcome: patient tolerated procedure well with no complications   Post-procedure details: wound care instructions given    Tinea corporis Left Thigh  Start ciclopirox once daily until clear then for 1 more week  ciclopirox (LOPROX) 0.77 % cream - Left Thigh Apply topically daily. 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 Jefm Bryant Restart nitro paste to bilateral web spaces on either side of left 2nd finger Keep hands and core warm as much as possible  Chronic condition with duration or expected duration over one year. Condition is bothersome to patient. Not currently at goal.    History of PreCancerous Actinic Keratosis  -  site(s) of PreCancerous Actinic Keratosis clear today. - these may recur and new lesions may form requiring treatment to prevent transformation into skin cancer - observe for new or changing spots and contact [place] for appointment if occur - photoprotection with sun protective clothing; sunglasses and broad spectrum sunscreen with SPF of at least 30 + and frequent self skin exams recommended -  yearly exams by a dermatologist recommended for persons with history of PreCancerous Actinic Keratoses  Return for TBSE, as scheduled.  Graciella Belton, RMA, am acting as scribe for Forest Gleason, MD .  Documentation: I have reviewed the above documentation for accuracy and completeness, and I agree with the above.  Forest Gleason, MD

## 2021-12-05 NOTE — Patient Instructions (Addendum)
Cryotherapy Aftercare  Wash gently with soap and water everyday.   Apply Vaseline and Band-Aid daily until healed.   Prior to procedure, discussed risks of blister formation, small wound, skin dyspigmentation, or rare scar following cryotherapy. Recommend Vaseline ointment to treated areas while healing.  If You Need Anything After Your Visit  If you have any questions or concerns for your doctor, please call our main line at [phone] and press option 4 to reach your doctor's medical assistant. If no one answers, please leave a voicemail as directed and we will return your call as soon as possible. Messages left after 4 pm will be answered the following business day.   You may also send [place] a message via [place]. We typically respond to MyChart messages within 1-2 business days.  For prescription refills, please ask your pharmacy to contact our office. Our fax number is [phone].  If you have an urgent issue when the clinic is closed that cannot wait until the next business day, you can page your doctor at the number below.    Please note that while we do our best to be available for urgent issues outside of office hours, we are not available 24/7.   If you have an urgent issue and are unable to reach [place], you may choose to seek medical care at your doctor's office, retail clinic, urgent care center, or emergency room.  If you have a medical emergency, please immediately call 911 or go to the emergency department.  Pager Numbers  - Dr. Nehemiah Massed: [phone]  - Dr. Laurence Ferrari: [phone]  - Dr. Nicole Kindred: [phone]  In the event of inclement weather, please call our main line at [phone] for an update on the status of any delays or closures.  Dermatology Medication Tips: Please keep the boxes that topical medications come in in order to help keep track of the instructions about where and how to use these. Pharmacies typically print the medication instructions only on the  boxes and not directly on the medication tubes.   If your medication is too expensive, please contact our office at [phone] option 4 or send [place] a message through [place].   We are unable to tell what your co-pay for medications will be in advance as this is different depending on your insurance coverage. However, we may be able to find a substitute medication at lower cost or fill out paperwork to get insurance to cover a needed medication.   If a prior authorization is required to get your medication covered by your insurance company, please allow [place] 1-2 business days to complete this process.  Drug prices often vary depending on where the prescription is filled and some pharmacies may offer cheaper prices.  The website www.goodrx.com contains coupons for medications through different pharmacies. The prices here do not account for what the cost may be with help from insurance (it may be cheaper with your insurance), but the website can give you the price if you did not use any insurance.  - You can print the associated coupon and take it with your prescription to the pharmacy.  - You may also stop by our office during regular business hours and pick up a GoodRx coupon card.  - If you need your prescription sent electronically to a different pharmacy, notify our office through [facility] or by phone at [phone] option 4.     Si Usted Necesita Algo Despus de Su Visita  Tambin puede enviarnos un mensaje a travs de  MyChart. Por lo general respondemos a los mensajes de MyChart en el transcurso de 1 a 2 das hbiles.  Para renovar recetas, por favor pida a su farmacia que se ponga en contacto con nuestra oficina. Harland Dingwall de fax es [place] [phone].  Si tiene un asunto urgente cuando la clnica est cerrada y que no puede esperar hasta el siguiente da hbil, puede llamar/localizar a su doctor(a) al nmero que aparece a continuacin.   Por favor, tenga en cuenta que  aunque hacemos todo lo posible para estar disponibles para asuntos urgentes fuera del horario de [place], no estamos disponibles las 24 horas del da, los 7 das de la [place].   Si tiene un problema urgente y no puede comunicarse con nosotros, puede optar por buscar atencin mdica  en el consultorio de su doctor(a), en una clnica privada, en un centro de atencin urgente o en una sala de emergencias.  Si tiene Engineering geologist, por favor llame inmediatamente al 911 o vaya a la sala de emergencias.  Nmeros de bper  - Dr. Nehemiah Massed: [phone]  - Dra. Moye: [phone]  - Dra. Nicole Kindred: [phone]  En caso de inclemencias del [place], por favor llame a Johnsie Kindred principal al [phone] para una actualizacin sobre el [place] de cualquier retraso o cierre.  Consejos para la medicacin en dermatologa: Por favor, guarde las cajas en las que vienen los medicamentos de uso tpico para ayudarle a seguir las instrucciones sobre dnde y cmo usarlos. Las farmacias generalmente imprimen las instrucciones del medicamento slo en las cajas y no directamente en los tubos del [place].   Si su medicamento es muy caro, por favor, pngase en contacto con Zigmund Daniel llamando al [phone] y presione la opcin 4 o envenos un mensaje a travs de Pharmacist, community.   No podemos decirle cul ser su copago por los medicamentos por adelantado ya que esto es diferente dependiendo de la cobertura de su seguro. Sin embargo, es posible que podamos encontrar un medicamento sustituto a Electrical engineer un formulario para que el seguro cubra el medicamento que se considera necesario.   Si se requiere una autorizacin previa para que su compaa de seguros [place] su medicamento, por favor permtanos de 1 a 2 das hbiles para completar este proceso.  Los precios de los medicamentos varan con frecuencia dependiendo del Environmental consultant de dnde se surte la receta y alguna farmacias pueden ofrecer precios ms  baratos.  El sitio web www.goodrx.com tiene cupones para medicamentos de Airline pilot. Los precios aqu no tienen en cuenta lo que podra costar con la ayuda del seguro (puede ser ms barato con su seguro), pero el sitio web puede darle el precio si no utiliz Research scientist (physical sciences).  - Puede imprimir el cupn correspondiente y llevarlo con su receta a la farmacia.  - Tambin puede pasar por nuestra oficina durante el horario de atencin regular y Charity fundraiser una tarjeta de cupones de GoodRx.  - Si necesita que su receta se enve electrnicamente a una farmacia diferente, informe a nuestra oficina a travs de MyChart de [place] o por telfono llamando al [phone] y presione la opcin 4.

## 2021-12-16 ENCOUNTER — Encounter: Payer: Self-pay | Admitting: Dermatology

## 2022-01-14 DIAGNOSIS — A4902 Methicillin resistant Staphylococcus aureus infection, unspecified site: Secondary | ICD-10-CM | POA: Diagnosis not present

## 2022-01-14 DIAGNOSIS — M349 Systemic sclerosis, unspecified: Secondary | ICD-10-CM | POA: Diagnosis not present

## 2022-01-14 DIAGNOSIS — I272 Pulmonary hypertension, unspecified: Secondary | ICD-10-CM | POA: Diagnosis not present

## 2022-01-14 DIAGNOSIS — I7300 Raynaud's syndrome without gangrene: Secondary | ICD-10-CM | POA: Diagnosis not present

## 2022-01-21 DIAGNOSIS — B351 Tinea unguium: Secondary | ICD-10-CM | POA: Diagnosis not present

## 2022-01-21 DIAGNOSIS — Z8679 Personal history of other diseases of the circulatory system: Secondary | ICD-10-CM | POA: Diagnosis not present

## 2022-01-21 DIAGNOSIS — L97521 Non-pressure chronic ulcer of other part of left foot limited to breakdown of skin: Secondary | ICD-10-CM | POA: Diagnosis not present

## 2022-01-21 DIAGNOSIS — M79674 Pain in right toe(s): Secondary | ICD-10-CM | POA: Diagnosis not present

## 2022-01-21 DIAGNOSIS — M79675 Pain in left toe(s): Secondary | ICD-10-CM | POA: Diagnosis not present

## 2022-01-22 DIAGNOSIS — I7300 Raynaud's syndrome without gangrene: Secondary | ICD-10-CM | POA: Diagnosis not present

## 2022-01-22 DIAGNOSIS — M349 Systemic sclerosis, unspecified: Secondary | ICD-10-CM | POA: Diagnosis not present

## 2022-01-22 DIAGNOSIS — I272 Pulmonary hypertension, unspecified: Secondary | ICD-10-CM | POA: Diagnosis not present

## 2022-01-30 DIAGNOSIS — I38 Endocarditis, valve unspecified: Secondary | ICD-10-CM | POA: Diagnosis not present

## 2022-01-30 DIAGNOSIS — Z950 Presence of cardiac pacemaker: Secondary | ICD-10-CM | POA: Diagnosis not present

## 2022-01-30 DIAGNOSIS — I495 Sick sinus syndrome: Secondary | ICD-10-CM | POA: Diagnosis not present

## 2022-01-30 DIAGNOSIS — I350 Nonrheumatic aortic (valve) stenosis: Secondary | ICD-10-CM | POA: Diagnosis not present

## 2022-01-30 DIAGNOSIS — I10 Essential (primary) hypertension: Secondary | ICD-10-CM | POA: Diagnosis not present

## 2022-01-30 DIAGNOSIS — I272 Pulmonary hypertension, unspecified: Secondary | ICD-10-CM | POA: Diagnosis not present

## 2022-04-09 DIAGNOSIS — R0602 Shortness of breath: Secondary | ICD-10-CM | POA: Diagnosis not present

## 2022-04-09 DIAGNOSIS — M349 Systemic sclerosis, unspecified: Secondary | ICD-10-CM | POA: Diagnosis not present

## 2022-04-16 ENCOUNTER — Other Ambulatory Visit: Payer: Self-pay

## 2022-04-16 DIAGNOSIS — I7300 Raynaud's syndrome without gangrene: Secondary | ICD-10-CM

## 2022-04-16 MED ORDER — SILDENAFIL CITRATE 20 MG PO TABS: ORAL_TABLET | ORAL | 0 refills | Status: DC

## 2022-04-16 NOTE — Progress Notes (Signed)
Patient called requesting RF for Sildenafil. 1 RF approved until patient follows up with Dr. Laurence Ferrari at the end of June. aw

## 2022-04-28 DIAGNOSIS — M2042 Other hammer toe(s) (acquired), left foot: Secondary | ICD-10-CM | POA: Diagnosis not present

## 2022-04-28 DIAGNOSIS — M778 Other enthesopathies, not elsewhere classified: Secondary | ICD-10-CM | POA: Diagnosis not present

## 2022-04-28 DIAGNOSIS — L97521 Non-pressure chronic ulcer of other part of left foot limited to breakdown of skin: Secondary | ICD-10-CM | POA: Diagnosis not present

## 2022-04-28 DIAGNOSIS — M898X9 Other specified disorders of bone, unspecified site: Secondary | ICD-10-CM | POA: Diagnosis not present

## 2022-04-28 DIAGNOSIS — M79674 Pain in right toe(s): Secondary | ICD-10-CM | POA: Diagnosis not present

## 2022-04-28 DIAGNOSIS — L600 Ingrowing nail: Secondary | ICD-10-CM | POA: Diagnosis not present

## 2022-04-28 DIAGNOSIS — M79675 Pain in left toe(s): Secondary | ICD-10-CM | POA: Diagnosis not present

## 2022-04-28 DIAGNOSIS — M2041 Other hammer toe(s) (acquired), right foot: Secondary | ICD-10-CM | POA: Diagnosis not present

## 2022-04-28 DIAGNOSIS — B351 Tinea unguium: Secondary | ICD-10-CM | POA: Diagnosis not present

## 2022-05-08 ENCOUNTER — Ambulatory Visit: Payer: Medicare HMO | Admitting: Dermatology

## 2022-05-15 ENCOUNTER — Ambulatory Visit: Payer: Medicare HMO | Admitting: Dermatology

## 2022-05-15 DIAGNOSIS — L578 Other skin changes due to chronic exposure to nonionizing radiation: Secondary | ICD-10-CM | POA: Diagnosis not present

## 2022-05-15 DIAGNOSIS — B078 Other viral warts: Secondary | ICD-10-CM

## 2022-05-15 DIAGNOSIS — L821 Other seborrheic keratosis: Secondary | ICD-10-CM

## 2022-05-15 DIAGNOSIS — L570 Actinic keratosis: Secondary | ICD-10-CM

## 2022-05-15 DIAGNOSIS — M349 Systemic sclerosis, unspecified: Secondary | ICD-10-CM

## 2022-05-15 DIAGNOSIS — Z1283 Encounter for screening for malignant neoplasm of skin: Secondary | ICD-10-CM | POA: Diagnosis not present

## 2022-05-15 DIAGNOSIS — L309 Dermatitis, unspecified: Secondary | ICD-10-CM | POA: Diagnosis not present

## 2022-05-15 DIAGNOSIS — B353 Tinea pedis: Secondary | ICD-10-CM

## 2022-05-15 DIAGNOSIS — Z86018 Personal history of other benign neoplasm: Secondary | ICD-10-CM

## 2022-05-15 DIAGNOSIS — I7300 Raynaud's syndrome without gangrene: Secondary | ICD-10-CM

## 2022-05-15 DIAGNOSIS — Z85828 Personal history of other malignant neoplasm of skin: Secondary | ICD-10-CM | POA: Diagnosis not present

## 2022-05-15 DIAGNOSIS — D229 Melanocytic nevi, unspecified: Secondary | ICD-10-CM

## 2022-05-15 DIAGNOSIS — D1800 Hemangioma unspecified site: Secondary | ICD-10-CM

## 2022-05-15 DIAGNOSIS — L814 Other melanin hyperpigmentation: Secondary | ICD-10-CM

## 2022-05-15 MED ORDER — SILDENAFIL CITRATE 20 MG PO TABS: 20.0000 mg | ORAL_TABLET | Freq: Three times a day (TID) | ORAL | 0 refills | Status: DC

## 2022-05-15 MED ORDER — SILDENAFIL CITRATE 20 MG PO TABS: ORAL_TABLET | ORAL | 2 refills | Status: DC

## 2022-05-15 MED ORDER — SILDENAFIL CITRATE 20 MG PO TABS: ORAL_TABLET | ORAL | 0 refills | Status: DC

## 2022-05-15 MED ORDER — HYDROCORTISONE 2.5 % EX CREA: TOPICAL_CREAM | Freq: Two times a day (BID) | CUTANEOUS | 1 refills | Status: DC

## 2022-05-15 NOTE — Patient Instructions (Addendum)
Cryotherapy Aftercare  Wash gently with soap and water everyday.   Apply Vaseline and Band-Aid daily until healed.   Start ketoconazole cream twice daily to feet and in between toes until clear/about 1 month.   Melanoma ABCDEs  Melanoma is the most dangerous type of skin cancer, and is the leading cause of death from skin disease.  You are more likely to develop melanoma if you: Have light-colored skin, light-colored eyes, or red or blond hair Spend a lot of time in the sun Tan regularly, either outdoors or in a tanning bed Have had blistering sunburns, especially during childhood Have a close family member who has had a melanoma Have atypical moles or large birthmarks  Early detection of melanoma is key since treatment is typically straightforward and cure rates are extremely high if we catch it early.   The first sign of melanoma is often a change in a mole or a new dark spot.  The ABCDE system is a way of remembering the signs of melanoma.  A for asymmetry:  The two halves do not match. B for border:  The edges of the growth are irregular. C for color:  A mixture of colors are present instead of an even brown color. D for diameter:  Melanomas are usually (but not always) greater than 50m - the size of a pencil eraser. E for evolution:  The spot keeps changing in size, shape, and color.  Please check your skin once per month between visits. You can use a small mirror in front and a large mirror behind you to keep an eye on the back side or your body.   If you see any new or changing lesions before your next follow-up, please call to schedule a visit.  Please continue daily skin protection including broad spectrum sunscreen SPF 30+ to sun-exposed areas, reapplying every 2 hours as needed when you're outdoors.    Due to recent changes in healthcare laws, you may see results of your pathology and/or laboratory studies on MyChart before the doctors have had a chance to review them. We  understand that in some cases there may be results that are confusing or concerning to you. Please understand that not all results are received at the same time and often the doctors may need to interpret multiple results in order to provide you with the best plan of care or course of treatment. Therefore, we ask that you please give u[place]2 business days to thoroughly review all your results before contacting the office for clarification. Should we see a critical lab result, you will be contacted sooner.   If You Need Anything After Your Visit  If you have any questions or concerns for your doctor, please call our main line at [phone] press option 4 to reach your doctor's medical assistant. If no one answers, please leave a voicemail as directed and we will return your call as soon as possible. Messages left after 4 pm will be answered the following business day.   You may also send [place] message via [place] We typically respond to MyChart messages within 1-2 business days.  For prescription refills, please ask your pharmacy to contact our office. Our fax number is [phone]  If you have an urgent issue when the clinic is closed that cannot wait until the next business day, you can page your doctor at the number below.    Please note that while we do our best to be available for urgent issues  outside of office hours, we are not available 24/7.   If you have an urgent issue and are unable to reach us, you may choose to seek medical care at your doctor's office, retail clinic, urgent care center, or emergency room.  If you have a medical emergency, please immediately call 911 or go to the emergency department.  Pager Numbers  - Dr. Kowalski: [phone]  - Dr. Moye: [phone]  - Dr. Stewart: [phone]  In the event of inclement weather, please call our main line at [phone] for an update on the status of any delays or closures.  Dermatology Medication Tips: Please  keep the boxes that topical medications come in in order to help keep track of the instructions about where and how to use these. Pharmacies typically print the medication instructions only on the boxes and not directly on the medication tubes.   If your medication is too expensive, please contact our office at [phone] option 4 or send us a message through MyChart.   We are unable to tell what your co-pay for medications will be in advance as this is different depending on your insurance coverage. However, we may be able to find a substitute medication at lower cost or fill out paperwork to get insurance to cover a needed medication.   If a prior authorization is required to get your medication covered by your insurance company, please allow us 1-2 business days to complete this process.  Drug prices often vary depending on where the prescription is filled and some pharmacies may offer cheaper prices.  The website www.goodrx.com contains coupons for medications through different pharmacies. The prices here do not account for what the cost may be with help from insurance (it may be cheaper with your insurance), but the website can give you the price if you did not use any insurance.  - You can print the associated coupon and take it with your prescription to the pharmacy.  - You may also stop by our office during regular business hours and pick up a GoodRx coupon card.  - If you need your prescription sent electronically to a different pharmacy, notify our office through [place] MyChart or by phone at [phone] option 4.     Si Usted Necesita Algo Despus de Su Visita  Tambin puede enviarnos un mensaje a travs de MyChart. Por lo general respondemos a los mensajes de MyChart en el transcurso de 1 a 2 das hbiles.  Para renovar recetas, por favor pida a su farmacia que se ponga en contacto con nuestra oficina. Nuestro nmero de fax es el [phone].  Si tiene un asunto urgente  cuando la clnica est cerrada y que no puede esperar hasta el siguiente da hbil, puede llamar/localizar a su doctor(a) al nmero que aparece a continuacin.   Por favor, tenga en cuenta que aunque hacemos todo lo posible para estar disponibles para asuntos urgentes fuera del horario de oficina, no estamos disponibles las 24 horas del da, los 7 das de la semana.   Si tiene un problema urgente y no puede comunicarse con nosotros, puede optar por buscar atencin mdica  en el consultorio de su doctor(a), en una clnica privada, en un centro de atencin urgente o en una sala de emergencias.  Si tiene una emergencia mdica, por favor llame inmediatamente al 911 o vaya a la sala de emergencias.  Nmeros de bper  - Dr. Kowalski: [phone]  - Dra. Moye: [phone]  - Dra. Stewart: [phone]  En caso   de inclemencias del tiempo, por favor llame a nuestra lnea principal al [phone] para una actualizacin sobre el estado de cualquier retraso o cierre.  Consejos para la medicacin en dermatologa: Por favor, guarde las cajas en las que vienen los medicamentos de uso tpico para ayudarle a seguir las instrucciones sobre dnde y cmo usarlos. Las farmacias generalmente imprimen las instrucciones del medicamento slo en las cajas y no directamente en los tubos del medicamento.   Si su medicamento es muy caro, por favor, pngase en contacto con nuestra oficina llamando al [phone] y presione la opcin 4 o envenos un mensaje a travs de MyChart.   No podemos decirle cul ser su copago por los medicamentos por adelantado ya que esto es diferente dependiendo de la cobertura de su seguro. Sin embargo, es posible que podamos encontrar un medicamento sustituto a menor costo o llenar un formulario para que el seguro cubra el medicamento que se considera necesario.   Si se requiere una autorizacin previa para que su compaa de seguros cubra su medicamento, por favor permtanos de 1 a 2  das hbiles para completar este proceso.  Los precios de los medicamentos varan con frecuencia dependiendo del lugar de dnde se surte la receta y alguna farmacias pueden ofrecer precios ms baratos.  El sitio web www.goodrx.com tiene cupones para medicamentos de diferentes farmacias. Los precios aqu no tienen en cuenta lo que podra costar con la ayuda del seguro (puede ser ms barato con su seguro), pero el sitio web puede darle el precio si no utiliz ningn seguro.  - Puede imprimir el cupn correspondiente y llevarlo con su receta a la farmacia.  - Tambin puede pasar por nuestra oficina durante el horario de atencin regular y recoger una tarjeta de cupones de GoodRx.  - Si necesita que su receta se enve electrnicamente a una farmacia diferente, informe a nuestra oficina a travs de MyChart de [place] o por telfono llamando al [phone] y presione la opcin 4.  

## 2022-05-15 NOTE — Progress Notes (Signed)
Follow-Up Visit   Subjective  [name] is a 76 y.o. female who presents for the following: FBSE (The patient presents for Total-Body Skin Exam (TBSE) for skin cancer screening and mole check.  The patient has spots, moles and lesions to be evaluated, some may be new or changing and the patient has concerns that these could be cancer. Patient with hx of SCC, BCC, AK's and dysplastic nevi.).  Patient with spots at right side of face that are irritated, a spot at left forearm and left eyebrow.  The following portions of the chart were reviewed this encounter and updated as appropriate:   Tobacco  Allergies  Meds  Problems  Med Hx  Surg Hx  Fam Hx      Review of Systems:  No other skin or systemic complaints except as noted in HPI or Assessment and Plan.  Objective  Well appearing patient in no apparent distress; mood and affect are within normal limits.  A full examination was performed including scalp, head, eyes, ears, nose, lips, neck, chest, axillae, abdomen, back, buttocks, bilateral upper extremities, bilateral lower extremities, hands, feet, fingers, toes, fingernails, and toenails. All findings within normal limits unless otherwise noted below.  Left Forearm, right inguinal fold (2) Verrucous papules -- Discussed viral etiology and contagion.   B/L hands Loss of multiple distal fingertips  L brow x 1, mid forehead x 1, nasal tip x 1, L pretibia x 1 (4) Erythematous thin papules/macules with gritty scale.   left foot Scaling and maceration web spaces and over distal and lateral soles.   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 To open areas  Other viral warts (2) Left Forearm, right inguinal  fold  Discussed viral etiology and risk of spread.  Discussed multiple treatments may be required to clear warts.  Discussed possible post-treatment dyspigmentation and risk of recurrence.  Prior to procedure, discussed risks of blister formation, small wound, skin dyspigmentation, or rare scar following cryotherapy. Recommend Vaseline ointment to treated areas while healing.  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 Catalina Lunger Rheumatology Continue Nitropaste 3 times daily as needed to finger webs   Chronic condition with duration or expected duration over one year. Currently well-controlled.   Related Medications sildenafil (REVATIO) 20 MG tablet Take 1/2 tablet 3 times daily  AK (actinic keratosis) (4) L brow x 1, mid forehead x 1, nasal tip x 1, L pretibia x 1  Actinic keratoses are precancerous spots that appear secondary to cumulative UV radiation exposure/sun exposure over time. They are chronic with expected duration over 1 year. A portion of actinic keratoses will progress to squamous cell carcinoma of the skin. It is not possible to reliably predict which spots will progress to skin cancer and so treatment is recommended to prevent development of skin cancer.  Recommend  daily broad spectrum sunscreen SPF 30+ to sun-exposed areas, reapply every 2 hours as needed.  Recommend staying in the shade or wearing long sleeves, sun glasses (UVA+UVB protection) and wide brim hats (4-inch brim around the entire circumference of the  hat). Call for new or changing lesions.  Prior to procedure, discussed risks of blister formation, small wound, skin dyspigmentation, or rare scar following cryotherapy. Recommend Vaseline ointment to treated areas while healing.   Destruction of lesion - L brow x 1, mid forehead x 1, nasal tip x 1, L pretibia x 1  Destruction method: cryotherapy   Informed consent: discussed and consent obtained   Lesion destroyed using liquid nitrogen: Yes   Cryotherapy cycles:  2 Outcome: patient tolerated procedure well with no complications   Post-procedure details: wound care instructions given    Tinea pedis of left foot left foot  Start ketoconazole cream twice daily to feet and in between toes until clear/about 1 month.  Patient has ketoconazole cream at home If not improving, will send in ciclopirox.  Nevus right flank lateral to breast  Benign-appearing.  Observation.  Call clinic for new or changing lesions.  Recommend daily use of broad spectrum spf 30+ sunscreen to sun-exposed areas.   Recheck on follow up.    Dermatitis right face  Start HC 2.5% cream twice daily for 1 week to itchy areas at face.  Topical steroids (such as triamcinolone, fluocinolone, fluocinonide, mometasone, clobetasol, halobetasol, betamethasone, hydrocortisone) can cause thinning and lightening of the skin if they are used for too long in the same area. Your physician has selected the right strength medicine for your problem and area affected on the body. Please use your medication only as directed by your physician to prevent side effects.    hydrocortisone 2.5 % cream - right face Apply topically 2 (two) times daily. For up to 1 week to itchy areas at face   Lentigines - Scattered tan macules - Due to sun exposure - Benign-appearing, observe - Recommend daily broad spectrum sunscreen SPF 30+ to sun-exposed areas, reapply every 2 hours as needed. - Call for any changes  Seborrheic Keratoses -  Stuck-on, waxy, tan-brown papules and/or plaques  - Benign-appearing - Discussed benign etiology and prognosis. - Observe - Call for any changes  Melanocytic Nevi - Tan-brown and/or pink-flesh-colored symmetric macules and papules - Benign appearing on exam today - Observation - Call clinic for new or changing moles - Recommend daily use of broad spectrum spf 30+ sunscreen to sun-exposed areas.   Hemangiomas - Red papules - Discussed benign nature - Observe - Call for any changes  Actinic Damage - Chronic condition, secondary to cumulative UV/sun exposure - diffuse scaly erythematous macules with underlying dyspigmentation - Recommend daily broad spectrum sunscreen SPF 30+ to sun-exposed areas, reapply every 2 hours as needed.  - Staying in the shade or wearing long sleeves, sun glasses (UVA+UVB protection) and wide brim hats (4-inch brim around the entire circumference of the hat) are also recommended for sun protection.  - Call for new or changing lesions.  History of Basal Cell Carcinoma of the Skin - No evidence of recurrence today - Recommend regular full body skin exams - Recommend daily broad spectrum sunscreen SPF 30+ to sun-exposed areas, reapply every 2 hours as needed.  - Call if any new or changing lesions are noted between office visits  History of Dysplastic Nevi - No evidence of recurrence today - Recommend regular full body skin exams - Recommend daily broad  spectrum sunscreen SPF 30+ to sun-exposed areas, reapply every 2 hours as needed.  - Call if any new or changing lesions are noted between office visits  History of Squamous Cell Carcinoma of the Skin - No evidence of recurrence today - No lymphadenopathy - Recommend regular full body skin exams - Recommend daily broad spectrum sunscreen SPF 30+ to sun-exposed areas, reapply every 2 hours as needed.  - Call if any new or changing lesions are noted between office visits   Skin cancer screening  performed today.  Return in about 3 months (around 08/15/2022) for AK follow up, Warts, recheck nevus.  Graciella Belton, RMA, am acting as scribe for Forest Gleason, MD .  Documentation: I have reviewed the above documentation for accuracy and completeness, and I agree with the above.  Forest Gleason, MD

## 2022-05-20 ENCOUNTER — Encounter: Payer: Self-pay | Admitting: Dermatology

## 2022-05-28 ENCOUNTER — Other Ambulatory Visit: Payer: Medicare HMO

## 2022-05-28 ENCOUNTER — Other Ambulatory Visit: Payer: Self-pay | Admitting: Podiatry

## 2022-05-28 DIAGNOSIS — M2042 Other hammer toe(s) (acquired), left foot: Secondary | ICD-10-CM | POA: Diagnosis not present

## 2022-05-28 DIAGNOSIS — Z862 Personal history of diseases of the blood and blood-forming organs and certain disorders involving the immune mechanism: Secondary | ICD-10-CM | POA: Diagnosis not present

## 2022-05-28 DIAGNOSIS — M2041 Other hammer toe(s) (acquired), right foot: Secondary | ICD-10-CM | POA: Diagnosis not present

## 2022-05-28 DIAGNOSIS — I10 Essential (primary) hypertension: Secondary | ICD-10-CM | POA: Diagnosis not present

## 2022-05-28 DIAGNOSIS — M898X9 Other specified disorders of bone, unspecified site: Secondary | ICD-10-CM | POA: Diagnosis not present

## 2022-05-28 DIAGNOSIS — E039 Hypothyroidism, unspecified: Secondary | ICD-10-CM | POA: Diagnosis not present

## 2022-05-29 ENCOUNTER — Encounter
Admission: RE | Admit: 2022-05-29 | Discharge: 2022-05-29 | Disposition: A | Discharge status: Home / Self Care | Payer: Medicare HMO | Source: Ambulatory Visit | Attending: Podiatry | Admitting: Podiatry

## 2022-05-29 VITALS — Ht 62.0 in | Wt 128.7 lb

## 2022-05-29 DIAGNOSIS — Z950 Presence of cardiac pacemaker: Secondary | ICD-10-CM

## 2022-05-29 DIAGNOSIS — Z01818 Encounter for other preprocedural examination: Secondary | ICD-10-CM

## 2022-05-29 DIAGNOSIS — I10 Essential (primary) hypertension: Secondary | ICD-10-CM

## 2022-05-29 HISTORY — DX: Nonrheumatic aortic (valve) stenosis: I35.0

## 2022-05-29 HISTORY — DX: Presence of cardiac pacemaker: Z95.0

## 2022-05-29 HISTORY — DX: Pulmonary hypertension, unspecified: I27.20

## 2022-05-29 HISTORY — DX: Unspecified osteoarthritis, unspecified site: M19.90

## 2022-05-29 HISTORY — DX: Sick sinus syndrome: I49.5

## 2022-05-29 NOTE — Patient Instructions (Addendum)
Your procedure is scheduled on:06-06-22 Friday Report to the Registration Desk on the 1st floor of the [facility].Then proceed to the 2nd floor Surgery Desk To find out your arrival time, please call [phone] between 1PM - 3PM on:06-05-22 Thursday If your arrival time is 6:00 am, do not arrive prior to that time as the [facility] entrance doors do not open until 6:00 am.  REMEMBER: Instructions that are not followed completely may result in serious medical risk, up to and including death; or upon the discretion of your surgeon and anesthesiologist your surgery may need to be rescheduled.  Do not eat food after midnight the night before surgery.  No gum chewing, lozengers or hard candies.  You may however, drink CLEAR liquids up to 2 hours before you are scheduled to arrive for your surgery. Do not drink anything within 2 hours of your scheduled arrival time.  Clear liquids include: - water  - apple juice without pulp - gatorade (not RED colors) - black coffee or tea (Do NOT add milk or creamers to the coffee or tea) Do NOT drink anything that is not on this list.  In addition, your doctor has ordered for you to drink the provided  Ensure Pre-Surgery Clear Carbohydrate Drink  Drinking this carbohydrate drink up to two hours before surgery helps to reduce insulin resistance and improve patient outcomes. Please complete drinking 2 hours prior to scheduled arrival time.  TAKE THESE MEDICATIONS THE MORNING OF SURGERY WITH A SIP OF WATER: -amLODipine (NORVASC)  -hydrALAZINE (APRESOLINE) -levothyroxine (SYNTHROID, LEVOTHROID)  -omeprazole (PRILOSEC)-take one the night before and one on the morning of surgery - helps to prevent nausea after surgery.)  Continue your 81 mg Aspirin as instructed by Dr Marlowe Sax NOT take the morning of surgery  One week prior to surgery: Stop Anti-inflammatories (NSAIDS) such as Advil, Aleve, Ibuprofen, Motrin, Naproxen, Naprosyn and Aspirin based  products such as Excedrin, Goodys Powder, BC Powder.You may however, take Tylenol if needed for pain up until the day of surgery.  Stop ANY OVER THE COUNTER supplements/vitamins NOW (05-29-22) until after surgery (Calcium, Vitamin D3, Multivitamin, Folic Acid, W10)  No Alcohol for 24 hours before or after surgery.  No Smoking including e-cigarettes for 24 hours prior to surgery.  No chewable tobacco products for at least 6 hours prior to surgery.  No nicotine patches on the day of surgery.  Do not use any "recreational" drugs for at least a week prior to your surgery.  Please be advised that the combination of cocaine and anesthesia may have negative outcomes, up to and including death. If you test positive for cocaine, your surgery will be cancelled.  On the morning of surgery brush your teeth with toothpaste and water, you may rinse your mouth with mouthwash if you wish. Do not swallow any toothpaste or mouthwash.  Use CHG Soap as directed on instruction sheet.  Do not wear jewelry, make-up, hairpins, clips or nail polish.  Do not wear lotions, powders, or perfumes.   Do not shave body from the neck down 48 hours prior to surgery just in case you cut yourself which could leave a site for infection.  Also, freshly shaved skin may become irritated if using the CHG soap.  Contact lenses, hearing aids and dentures may not be worn into surgery.  Do not bring valuables to the hospital. [facility] is not responsible for any missing/lost belongings or valuables.  Notify your doctor if there is any change in your medical  condition (cold, fever, infection).  Wear comfortable clothing (specific to your surgery type) to the hospital.  After surgery, you can help prevent lung complications by doing breathing exercises.  Take deep breaths and cough every 1-2 hours. Your doctor may order a device called an Incentive Spirometer to help you take deep breaths. When coughing or sneezing, hold a  pillow firmly against your incision with both hands. This is called "splinting." Doing this helps protect your incision. It also decreases belly discomfort.  If you are being admitted to the hospital overnight, leave your suitcase in the car. After surgery it may be brought to your room.  If you are being discharged the day of surgery, you will not be allowed to drive home. You will need a responsible adult (18 years or older) to drive you home and stay with you that night.   If you are taking public transportation, you will need to have a responsible adult (18 years or older) with you. Please confirm with your physician that it is acceptable to use public transportation.   Please call the [facility] at [phone] if you have any questions about these instructions.  Surgery Visitation Policy:  Patients undergoing a surgery or procedure may have two family members or support persons with them as long as the person is not COVID-19 positive or experiencing its symptoms.    How to Use an Incentive Spirometer An incentive spirometer is a tool that measures how well you are filling your lungs with each breath. Learning to take long, deep breaths using this tool can help you keep your lungs clear and active. This may help to reverse or lessen your chance of developing breathing (pulmonary) problems, especially infection. You may be asked to use a spirometer: After a surgery. If you have a lung problem or a history of smoking. After a long period of time when you have been unable to move or be active. If the spirometer includes an indicator to show the highest number that you have reached, your health care provider or respiratory therapist will help you set a goal. Keep a log of your progress as told by your health care provider. What are the risks? Breathing too quickly may cause dizziness or cause you to pass out. Take your time so you do not get dizzy or light-headed. If  you are in pain, you may need to take pain medicine before doing incentive spirometry. It is harder to take a deep breath if you are having pain. How to use your incentive spirometer  Sit up on the edge of your bed or on a chair. Hold the incentive spirometer so that it is in an upright position. Before you use the spirometer, breathe out normally. Place the mouthpiece in your mouth. Make sure your lips are closed tightly around it. Breathe in slowly and as deeply as you can through your mouth, causing the piston or the ball to rise toward the top of the chamber. Hold your breath for 3-5 seconds, or for as long as possible. If the spirometer includes a coach indicator, use this to guide you in breathing. Slow down your breathing if the indicator goes above the marked areas. Remove the mouthpiece from your mouth and breathe out normally. The piston or ball will return to the bottom of the chamber. Rest for a few seconds, then repeat the steps 10 or more times. Take your time and take a few normal breaths between deep breaths so that you  do not get dizzy or light-headed. Do this every 1-2 hours when you are awake. If the spirometer includes a goal marker to show the highest number you have reached (best effort), use this as a goal to work toward during each repetition. After each set of 10 deep breaths, cough a few times. This will help to make sure that your lungs are clear. If you have an incision on your chest or abdomen from surgery, place a pillow or a rolled-up towel firmly against the incision when you cough. This can help to reduce pain while taking deep breaths and coughing. General tips When you are able to get out of bed: Walk around often. Continue to take deep breaths and cough in order to clear your lungs. Keep using the incentive spirometer until your health care provider says it is okay to stop using it. If you have been in the hospital, you may be told to keep using the spirometer  at home. Contact a health care provider if: You are having difficulty using the spirometer. You have trouble using the spirometer as often as instructed. Your pain medicine is not giving enough relief for you to use the spirometer as told. You have a fever. Get help right away if: You develop shortness of breath. You develop a cough with bloody mucus from the lungs. You have fluid or blood coming from an incision site after you cough. Summary An incentive spirometer is a tool that can help you learn to take long, deep breaths to keep your lungs clear and active. You may be asked to use a spirometer after a surgery, if you have a lung problem or a history of smoking, or if you have been inactive for a long period of time. Use your incentive spirometer as instructed every 1-2 hours while you are awake. If you have an incision on your chest or abdomen, place a pillow or a rolled-up towel firmly against your incision when you cough. This will help to reduce pain. Get help right away if you have shortness of breath, you cough up bloody mucus, or blood comes from your incision when you cough. This information is not intended to replace advice given to you by your health care provider. Make sure you discuss any questions you have with your health care provider. Document Revised: 01/23/2020 Document Reviewed: 01/23/2020 Elsevier Patient Education  [place].

## 2022-06-01 ENCOUNTER — Encounter: Payer: Self-pay | Admitting: Podiatry

## 2022-06-01 NOTE — Progress Notes (Signed)
Perioperative Services  Pre-Admission/Anesthesia Testing Clinical Review  Date: 06/05/22  Patient Demographics:  Name: [name] DOB:   1946/09/28 MRN:   [id number]  Planned Surgical Procedure(s):    Case: [id number] Date/Time: 06/06/22 0715   Procedure: EXCISION PARTIAL PHALANX (Left)   Anesthesia type: Choice   Pre-op diagnosis:      M20.42 - Hammertoe     M25.70 - Exostosis/Osteophyte   Location: [facility] OR ROOM 01 / [facility]   Surgeons: Sharlotte Alamo, DPM   NOTE: Available PAT nursing documentation and vital signs have been reviewed. Clinical nursing staff has updated patient's PMH/PSHx, current medication list, and drug allergies/intolerances to ensure comprehensive history available to assist in medical decision making as it pertains to the aforementioned surgical procedure and anticipated anesthetic course. Extensive review of available clinical information performed. [place] [facility] and PSHx updated with any diagnoses/procedures that  may have been inadvertently omitted during her intake with the pre-admission testing department's nursing staff.  Clinical Discussion:  [name] is a 76 y.o. female who is submitted for pre-surgical anesthesia review and clearance prior to her undergoing the above procedure. Patient has never been a smoker/ Pertinent PMH includes: SSS/CHB (s/p PPM placement), diastolic dysfunction, aortic valve stenosis, pulmonary hypertension, LBBB, cardiac murmur, HTN, hypothyroidism, GERD (on daily PPI), scleroderma, anemia, OA.  Patient is followed by cardiology Clayborn Bigness, MD). She was last seen in the cardiology clinic on 01/30/2022; notes reviewed. At the time of her clinic visit, the patient denied any chest pain, shortness of breath, PND, orthopnea, palpitations, significant peripheral edema, vertiginous symptoms, or presyncope/syncope. Patient with a history of scleroderma, which is jointly managed by rheumatology and pulmonary medicine.  Patient with a PMH significant for cardiovascular diagnoses.  Patient developed sick sinus syndrome/complete heart block and 10/2010 necessitating placement of a PPM.  Pulse generator was changed out on 10/29/2021.  Last TTE was performed on 04/22/2021 revealing normal left ventricular systolic function with an EF of >55%.  There was mild LVH. Diastolic Doppler parameters consistent with abnormal relaxation (G1DD).  There was mild mitral annular calcification.  Left atrium mildly enlarged.  There was trivial pulmonary, mild mitral, and moderate-severe tricuspid valve regurgitation.  Aortic valve with no regurgitation, however mild stenosis noted with a mean pressure gradient of 12.2 mmHg; AVA (VTI) = 2.9 cm.  Pulmonary hypertension present; RVSP 43.0 mmHg.  Blood pressure reasonably controlled at 130/62 on currently prescribed CCB, ACEi, and vasodilator therapies.  Pulmonary hypertension diagnosis being treated with ACEi (enalapril) + PDE5i (sildenafil), and the patient is  reported to be doing well overall.  She is not on any type of lipid-lowering therapies for ASCVD prevention.  Patient is not diabetic. Functional capacity, as defined by DASI, is documented as being >/= 4 METS.  No changes were made to her medication regimen.  Patient to follow-up with outpatient cardiology in 6 months or sooner if needed.  [name] is scheduled for an elective EXCISION PARTIAL PHALANX FOR HAMMERTOE CORRECTION on 06/06/2022 with Dr. Sharlotte Alamo, DPM.  Given patient's past medical history significant for cardiovascular diagnoses, presurgical cardiac clearance was sought by the PAT team. Per cardiology, "this patient is optimized for surgery and may proceed with the planned procedural course with a LOW risk of significant perioperative cardiovascular complications". In review of her medication reconciliation, it is noted the patient is on daily antiplatelet therapy. She has been instructed on recommendations from her  surgeon for continuing her daily low-dose ASA dose throughout  the perioperative course, holding only on the morning of surgery.  Patient denies previous perioperative complications with anesthesia in the past. In review of the available records, it is noted that patient underwent a general anesthetic course here at [facility] (ASA II) in 09/2016 without documented complications.      05/29/2022    2:07 PM 10/29/2021   10:00 AM 10/29/2021    9:45 AM  Vitals with BMI  Height _0     Weight 128 lbs 12 oz    BMI 41.96    Systolic  222 979  Diastolic  74 63  Pulse  85 73    Providers/Specialists:   NOTE: Primary physician provider listed below. Patient may have been seen by APP or partner within same practice.   PROVIDER ROLE / SPECIALTY LAST Merry Proud, DPM Podiatry (Surgeon) 05/28/2022  Dion Body, MD Primary Care Provider 11/26/2021  Katrine Coho, MD Cardiology 01/30/2022  Wallene Huh, MD Pulmonary Medicine 04/09/2022  Ephriam Jenkins, MD Rheumatology 01/22/2022   Allergies:  Penicillins and Sulfa antibiotics  Current Home Medications:   No current facility-administered medications for this encounter.    amLODipine (NORVASC) 10 MG tablet   aspirin EC 81 MG tablet   calcium carbonate (TUMS - DOSED IN MG ELEMENTAL CALCIUM) 500 MG chewable tablet   Calcium Citrate-Vitamin D (CALCIUM + D PO)   cholecalciferol (VITAMIN D3) 25 MCG (1000 UNIT) tablet   enalapril (VASOTEC) 2.5 MG tablet   folic acid (FOLVITE) 892 MCG tablet   hydrALAZINE (APRESOLINE) 50 MG tablet   ibuprofen (ADVIL) 200 MG tablet   levothyroxine (SYNTHROID, LEVOTHROID) 75 MCG tablet   methotrexate (RHEUMATREX) 2.5 MG tablet   Multiple Vitamin (MULTIVITAMIN WITH MINERALS) TABS tablet   omeprazole (PRILOSEC) 20 MG capsule   sildenafil (REVATIO) 20 MG tablet   vitamin B-12 (CYANOCOBALAMIN) 1000 MCG tablet   History:   Past Medical History:  Diagnosis Date    Actinic keratosis 09/01/2019   right cheek   Actinic keratosis 10/20/2019   left lat shin   Adenomatous colon polyp    Anemia    Aortic valve stenosis 04/22/2021   a.) 04/22/2021: EF 55%, mild AS (MPG 12.2 mmHg)   Arthritis    Basal cell carcinoma 07/09/2018   left upper forehead   Basal cell carcinoma 09/01/2019   nasal dorsum   Cardiac murmur    Complete heart block ([place]) 10/2010   a.) s/p PPM placement in 10/2010; PG changed 11/94/1740   Diastolic dysfunction 81/44/8185   a.) TTE 02/26/2017: EF 55%, mild LVH, mild LAE, mod RVE, triv AR/PR, mild MR, mod TR, RVSP 63.4, G1DD; b.) TTE 03/02/2017: EF >55%, mod RVE, triv AR, mild MR/PR, mod TR, RVSP 48; G1DD; c.) TTE 04/22/2021: EF 55%, mild LVH, mild MAC, mild LAE, triv PR, mild MR, mod TR, mild AS (MPG 12.2), RVSP 43, G1DD   Dysplastic nevus 10/20/2019   right sup chest/moderate, limited margins free   GERD (gastroesophageal reflux disease)    Hypertension    Hypothyroidism    LBBB (left bundle branch block)    Long term current use of immunosuppressive drug    a.) MTX for scleroderma Dx   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   Pulmonary HTN ([place]) 02/27/2016   a.) TTE 12/15/2005: EF >55%, RVSP 33 mmHg; b.) TTE 02/26/2017: EF 55%, RVSP 63.4; c.) TTE 03/02/2017: EF >55%, RVSP 48; d.) TTE 04/22/2021: EF 55%, RVSP 43; e.) Tx'd with ACEi (analapril) +  PDE5i (sildenafil)   Raynaud's disease    Scleroderma ([place])    a.) FANA and  anti Ro Ab (+), mild interstitial change on CT, normal barium swallow, sclerodactyly with contracture, iflammatory arthritis, mild pulmonary hypertension; b.) on long term Tx using oral MTX   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   right cheek Moh's 01/08/2021   SSS (sick sinus syndrome) ([place]) 10/2010   a.) s/p PPM placement in 10/2010; PG changed 10/29/2021   Vitamin B 12 deficiency    Vitamin D deficiency    Past Surgical History:  Procedure Laterality Date   ABDOMINAL HYSTERECTOMY N/A     COLONOSCOPY N/A 09/22/2016   Procedure: COLONOSCOPY;  Surgeon: Manya Silvas, MD;  Location: [facility] ENDOSCOPY;  Service: Endoscopy;  Laterality: N/A;  Pacemaker; Diffuse Scleroderma   COLONOSCOPY N/A 12/20/2001   COLONOSCOPY N/A 07/12/2004   COLONOSCOPY N/A 07/04/2011   ESOPHAGOGASTRODUODENOSCOPY N/A 08/11/2007   PACEMAKER INSERTION N/A 10/2010   PPM GENERATOR CHANGEOUT N/A 10/29/2021   Procedure: PPM GENERATOR CHANGEOUT;  Surgeon: Isaias Cowman, MD;  Location: [facility] CV LAB;  Service: Cardiovascular;  Laterality: N/A;   TUBAL LIGATION Bilateral 1979   Family History  Problem Relation Age of Onset   Breast cancer Neg Hx    Social History   Tobacco Use   Smoking status: Never   Smokeless tobacco: Never  Vaping Use   Vaping Use: Never used  Substance Use Topics   Alcohol use: Never   Drug use: Never    Pertinent Clinical Results:  LABS: Labs reviewed: Acceptable for surgery.   Ref Range & Units 05/28/2022  WBC (White Blood Cell Count) 4.1 - 10.2 10^3/uL 6.3   RBC (Red Blood Cell Count) 4.04 - 5.48 10^6/uL 4.08   Hemoglobin 12.0 - 15.0 gm/dL 10.4 Low    Hematocrit 35.0 - 47.0 % 33.6 Low    MCV (Mean Corpuscular Volume) 80.0 - 100.0 fl 82.4   MCH (Mean Corpuscular Hemoglobin) 27.0 - 31.2 pg 25.5 Low    MCHC (Mean Corpuscular Hemoglobin Concentration) 32.0 - 36.0 gm/dL 31.0 Low    Platelet Count 150 - 450 10^3/uL 212   RDW-CV (Red Cell Distribution Width) 11.6 - 14.8 % 20.9 High    MPV (Mean Platelet Volume) 9.4 - 12.4 fl 12.5 High    Neutrophils 1.50 - 7.80 10^3/uL 4.92   Lymphocytes 1.00 - 3.60 10^3/uL 0.70 Low    Monocytes 0.00 - 1.50 10^3/uL 0.53   Eosinophils 0.00 - 0.55 10^3/uL 0.09   Basophils 0.00 - 0.09 10^3/uL 0.03   Neutrophil % 32.0 - 70.0 % 78.4 High    Lymphocyte % 10.0 - 50.0 % 11.1   Monocyte % 4.0 - 13.0 % 8.4   Eosinophil % 1.0 - 5.0 % 1.4   Basophil% 0.0 - 2.0 % 0.5   Immature Granulocyte % <=0.7 % 0.2   Immature Granulocyte Count  <=0.06 10^3/L 0.01   Specimen Collected: 05/28/22 08:34   Performed by: [facility] - LAB Last Resulted: 05/28/22 09:20  Received From: [facility]  Result Received: 05/29/22 13:27    Ref Range & Units 05/28/2022  Glucose 70 - 110 mg/dL 102   Sodium 136 - 145 mmol/L 140   Potassium 3.6 - 5.1 mmol/L 4.0   Chloride 97 - 109 mmol/L 106   Carbon Dioxide (CO2) 22.0 - 32.0 mmol/L 26.7   Urea Nitrogen (BUN) 7 - 25 mg/dL 12   Creatinine 0.6 - 1.1 mg/dL 0.7   Glomerular  Filtration Rate (eGFR), MDRD Estimate >60 mL/min/1.73sq m 81   Calcium 8.7 - 10.3 mg/dL 10.0   AST  8 - 39 U/L 19   ALT  5 - 38 U/L 10   Alk Phos (alkaline Phosphatase) 34 - 104 U/L 65   Albumin 3.5 - 4.8 g/dL 4.3   Bilirubin, Total 0.3 - 1.2 mg/dL 0.5   Protein, Total 6.1 - 7.9 g/dL 7.8   A/G Ratio 1.0 - 5.0 gm/dL 1.2   Specimen Collected: 05/28/22 08:34   Performed by: [facility]: 05/28/22 11:41  Received From: [facility]  Result Received: 05/29/22 13:27    ECG: Date: 06/02/2022 Time ECG obtained: 0950 AM Rate: 68 bpm Rhythm:  Atrial sensed ventricular paced rhythm Axis (leads I and aVF): Normal Intervals: PR 204 ms. QRS 126 ms. QTc 467 ms. ST segment and T wave changes: No evidence of acute ST segment elevation or depression Comparison: Similar to previous tracing obtained on 11/07/2021   IMAGING / PROCEDURES: DIAGNOSTIC RADIOGRAPHS OF FOOT LEFT 3 PLUS VIEWS performed on 04/28/2022 DP, oblique, and lateral views of the LEFT foot reveal significant arthritic changes at the LEFT fifth toe PIP joint with exostosis present and evidence of possible previous anterior particular fracture at the PIP joint.   No recent fracture or dislocation noted  TRANSTHORACIC ECHOCARDIOGRAM performed on 04/22/2021 Normal left ventricular systolic function with mild LVH; LVEF >76% Diastolic Doppler parameters consistent with abnormal relaxation (G1DD). Mild  mitral annular calcification Mild left atrial enlargement Trivial PR Mild MR Moderate TR No AR Mild aortic stenosis with a mean pressure gradient of 12.2 mmHg; AVA (VTI) 2.9 cm  Impression and Plan:  [name] has been referred for pre-anesthesia review and clearance prior to her undergoing the planned anesthetic and procedural courses. Available labs, pertinent testing, and imaging results were personally reviewed by me. This patient has been appropriately cleared by cardiology with an overall LOW risk of significant perioperative cardiovascular complications. Perioperative device management form was sent to office for completion, however it was not returned. Office staff is aware of need. If form is sent back prior to patient's surgery, will forward it to SDS for inclusion with patient's surgical/OR chart.   Based on clinical review performed today (06/05/22), barring any significant acute changes in the patient's overall condition, it is anticipated that she will be able to proceed with the planned surgical intervention. Any acute changes in clinical condition may necessitate her procedure being postponed and/or cancelled. Patient will meet with anesthesia team (MD and/or CRNA) on the day of her procedure for preoperative evaluation/assessment. Questions regarding anesthetic course will be fielded at that time.   Pre-surgical instructions were reviewed with the patient during her PAT appointment and questions were fielded by PAT clinical staff. Patient was advised that if any questions or concerns arise prior to her procedure then she should return a call to PAT and/or her surgeon's office to discuss.  Honor Loh, MSN, APRN, FNP-C, CEN [facility]  Peri-operative Services Nurse Practitioner Phone: [phone] Fax: [phone] 06/05/22 5:03 PM  NOTE: This note has been prepared using Dragon dictation software. Despite my best ability to proofread, there is always  the potential that unintentional transcriptional errors may still occur from this process.

## 2022-06-02 ENCOUNTER — Encounter
Admission: RE | Admit: 2022-06-02 | Discharge: 2022-06-02 | Disposition: A | Discharge status: Home / Self Care | Payer: Medicare HMO | Source: Ambulatory Visit | Attending: Podiatry | Admitting: Podiatry

## 2022-06-02 DIAGNOSIS — I10 Essential (primary) hypertension: Secondary | ICD-10-CM | POA: Insufficient documentation

## 2022-06-02 DIAGNOSIS — Z950 Presence of cardiac pacemaker: Secondary | ICD-10-CM | POA: Diagnosis not present

## 2022-06-02 DIAGNOSIS — Z01810 Encounter for preprocedural cardiovascular examination: Secondary | ICD-10-CM | POA: Diagnosis not present

## 2022-06-02 DIAGNOSIS — Z01818 Encounter for other preprocedural examination: Secondary | ICD-10-CM

## 2022-06-04 DIAGNOSIS — D649 Anemia, unspecified: Secondary | ICD-10-CM | POA: Diagnosis not present

## 2022-06-04 DIAGNOSIS — I10 Essential (primary) hypertension: Secondary | ICD-10-CM | POA: Diagnosis not present

## 2022-06-04 DIAGNOSIS — Z0000 Encounter for general adult medical examination without abnormal findings: Secondary | ICD-10-CM | POA: Diagnosis not present

## 2022-06-04 DIAGNOSIS — Z136 Encounter for screening for cardiovascular disorders: Secondary | ICD-10-CM | POA: Diagnosis not present

## 2022-06-06 ENCOUNTER — Encounter
Admission: RE | Disposition: A | Discharge status: Home / Self Care | Payer: Self-pay | Source: Home / Self Care | Attending: Podiatry

## 2022-06-06 ENCOUNTER — Ambulatory Visit
Admission: RE | Admit: 2022-06-06 | Discharge: 2022-06-06 | Disposition: A | Discharge status: Home / Self Care | Payer: Medicare HMO | Attending: Podiatry | Admitting: Podiatry

## 2022-06-06 ENCOUNTER — Encounter: Payer: Self-pay | Admitting: Podiatry

## 2022-06-06 ENCOUNTER — Other Ambulatory Visit: Payer: Self-pay

## 2022-06-06 ENCOUNTER — Ambulatory Visit: Payer: Medicare HMO | Admitting: Urgent Care

## 2022-06-06 DIAGNOSIS — I10 Essential (primary) hypertension: Secondary | ICD-10-CM | POA: Insufficient documentation

## 2022-06-06 DIAGNOSIS — M2570 Osteophyte, unspecified joint: Secondary | ICD-10-CM | POA: Diagnosis not present

## 2022-06-06 DIAGNOSIS — Z7982 Long term (current) use of aspirin: Secondary | ICD-10-CM | POA: Insufficient documentation

## 2022-06-06 DIAGNOSIS — M899 Disorder of bone, unspecified: Secondary | ICD-10-CM | POA: Insufficient documentation

## 2022-06-06 DIAGNOSIS — I272 Pulmonary hypertension, unspecified: Secondary | ICD-10-CM | POA: Insufficient documentation

## 2022-06-06 DIAGNOSIS — Z7902 Long term (current) use of antithrombotics/antiplatelets: Secondary | ICD-10-CM | POA: Insufficient documentation

## 2022-06-06 DIAGNOSIS — I495 Sick sinus syndrome: Secondary | ICD-10-CM | POA: Insufficient documentation

## 2022-06-06 DIAGNOSIS — M2042 Other hammer toe(s) (acquired), left foot: Secondary | ICD-10-CM | POA: Diagnosis not present

## 2022-06-06 DIAGNOSIS — E039 Hypothyroidism, unspecified: Secondary | ICD-10-CM | POA: Diagnosis not present

## 2022-06-06 DIAGNOSIS — K219 Gastro-esophageal reflux disease without esophagitis: Secondary | ICD-10-CM | POA: Diagnosis not present

## 2022-06-06 DIAGNOSIS — I442 Atrioventricular block, complete: Secondary | ICD-10-CM | POA: Insufficient documentation

## 2022-06-06 DIAGNOSIS — Z950 Presence of cardiac pacemaker: Secondary | ICD-10-CM | POA: Diagnosis not present

## 2022-06-06 HISTORY — DX: Other long term (current) drug therapy: Z79.899

## 2022-06-06 HISTORY — DX: Raynaud's syndrome without gangrene: I73.00

## 2022-06-06 HISTORY — DX: Long term (current) use of unspecified immunomodulators and immunosuppressants: Z79.60

## 2022-06-06 HISTORY — DX: Cardiac murmur, unspecified: R01.1

## 2022-06-06 HISTORY — PX: EXCISION PARTIAL PHALANX: SHX6617

## 2022-06-06 HISTORY — DX: Benign neoplasm of colon, unspecified: D12.6

## 2022-06-06 HISTORY — DX: Vitamin D deficiency, unspecified: E55.9

## 2022-06-06 HISTORY — DX: Left bundle-branch block, unspecified: I44.7

## 2022-06-06 SURGERY — EXCISION, PHALANX, PARTIAL
Anesthesia: Choice | Laterality: Left

## 2022-06-06 MED ORDER — VANCOMYCIN HCL IN DEXTROSE 1-5 GM/200ML-% IV SOLN
INTRAVENOUS | Status: AC
  Filled 2022-06-06: qty 200

## 2022-06-06 MED ORDER — PHENYLEPHRINE HCL (PRESSORS) 10 MG/ML IV SOLN
INTRAVENOUS | Status: DC | PRN
  Administered 2022-06-06 (×3): 80 ug via INTRAVENOUS

## 2022-06-06 MED ORDER — CHLORHEXIDINE GLUCONATE 0.12 % MT SOLN: 15.0000 mL | Freq: Once | OROMUCOSAL | Status: AC

## 2022-06-06 MED ORDER — HYDROCODONE-ACETAMINOPHEN 5-325 MG PO TABS: 1.0000 | ORAL_TABLET | ORAL | 0 refills | Status: AC | PRN

## 2022-06-06 MED ORDER — OXYCODONE HCL 5 MG/5ML PO SOLN: 5.0000 mg | Freq: Once | ORAL | Status: DC | PRN

## 2022-06-06 MED ORDER — PROPOFOL 10 MG/ML IV BOLUS
INTRAVENOUS | Status: AC
  Filled 2022-06-06: qty 20

## 2022-06-06 MED ORDER — FENTANYL CITRATE (PF) 100 MCG/2ML IJ SOLN
INTRAMUSCULAR | Status: AC
  Filled 2022-06-06: qty 2

## 2022-06-06 MED ORDER — FENTANYL CITRATE (PF) 100 MCG/2ML IJ SOLN
INTRAMUSCULAR | Status: DC | PRN
  Administered 2022-06-06: 25 ug via INTRAVENOUS

## 2022-06-06 MED ORDER — 0.9 % SODIUM CHLORIDE (POUR BTL) OPTIME
TOPICAL | Status: DC | PRN
  Administered 2022-06-06: 500 mL

## 2022-06-06 MED ORDER — OXYCODONE HCL 5 MG PO TABS: 5.0000 mg | ORAL_TABLET | Freq: Once | ORAL | Status: DC | PRN

## 2022-06-06 MED ORDER — PROPOFOL 10 MG/ML IV BOLUS
INTRAVENOUS | Status: DC | PRN
  Administered 2022-06-06: 150 mg via INTRAVENOUS

## 2022-06-06 MED ORDER — FENTANYL CITRATE (PF) 100 MCG/2ML IJ SOLN: 25.0000 ug | INTRAMUSCULAR | Status: DC | PRN

## 2022-06-06 MED ORDER — BUPIVACAINE HCL (PF) 0.5 % IJ SOLN
INTRAMUSCULAR | Status: AC
  Filled 2022-06-06: qty 30

## 2022-06-06 MED ORDER — LACTATED RINGERS IV SOLN: INTRAVENOUS | Status: DC

## 2022-06-06 MED ORDER — VANCOMYCIN HCL IN DEXTROSE 1-5 GM/200ML-% IV SOLN: 1000.0000 mg | INTRAVENOUS | Status: DC

## 2022-06-06 MED ORDER — SEVOFLURANE IN SOLN
RESPIRATORY_TRACT | Status: AC
  Filled 2022-06-06: qty 250

## 2022-06-06 MED ORDER — CHLORHEXIDINE GLUCONATE 0.12 % MT SOLN
OROMUCOSAL | Status: AC
  Administered 2022-06-06: 15 mL via OROMUCOSAL
  Filled 2022-06-06: qty 15

## 2022-06-06 MED ORDER — LIDOCAINE HCL (CARDIAC) PF 100 MG/5ML IV SOSY
PREFILLED_SYRINGE | INTRAVENOUS | Status: DC | PRN
  Administered 2022-06-06: 50 mg via INTRAVENOUS

## 2022-06-06 MED ORDER — ONDANSETRON HCL 4 MG/2ML IJ SOLN
INTRAMUSCULAR | Status: DC | PRN
  Administered 2022-06-06: 4 mg via INTRAVENOUS

## 2022-06-06 MED ORDER — ORAL CARE MOUTH RINSE: 15.0000 mL | Freq: Once | OROMUCOSAL | Status: AC

## 2022-06-06 MED ORDER — VANCOMYCIN HCL 1000 MG IV SOLR
INTRAVENOUS | Status: DC | PRN
  Administered 2022-06-06: 1000 mg via INTRAVENOUS

## 2022-06-06 MED ORDER — BUPIVACAINE HCL (PF) 0.5 % IJ SOLN
INTRAMUSCULAR | Status: DC | PRN
  Administered 2022-06-06: 4 mL

## 2022-06-06 SURGICAL SUPPLY — 31 items
BLADE OSC/SAGITTAL MD 5.5X18 (BLADE) ×1 IMPLANT
BNDG COHESIVE 4X5 TAN ST LF (GAUZE/BANDAGES/DRESSINGS) ×2 IMPLANT
BNDG ESMARK 4X12 TAN STRL LF (GAUZE/BANDAGES/DRESSINGS) ×2 IMPLANT
DRAPE FLUOR MINI C-ARM 54X84 (DRAPES) ×2 IMPLANT
DURAPREP 26ML APPLICATOR (WOUND CARE) ×2 IMPLANT
ELECT REM PT RETURN 9FT ADLT (ELECTROSURGICAL) ×2
ELECTRODE REM PT RTRN 9FT ADLT (ELECTROSURGICAL) ×1 IMPLANT
GAUZE SPONGE 4X4 12PLY STRL (GAUZE/BANDAGES/DRESSINGS) ×2 IMPLANT
GAUZE XEROFORM 4X4 STRL (GAUZE/BANDAGES/DRESSINGS) ×1 IMPLANT
GLOVE BIO SURGEON STRL SZ7.5 (GLOVE) ×2 IMPLANT
GLOVE SURG UNDER LTX SZ8 (GLOVE) ×2 IMPLANT
GOWN STRL REUS W/ TWL LRG LVL3 (GOWN DISPOSABLE) ×2 IMPLANT
GOWN STRL REUS W/TWL LRG LVL3 (GOWN DISPOSABLE) ×4
HANDLE YANKAUER SUCT BULB TIP (MISCELLANEOUS) ×2 IMPLANT
KIT TURNOVER KIT A (KITS) ×2 IMPLANT
LABEL OR SOLS (LABEL) ×2 IMPLANT
MANIFOLD NEPTUNE II (INSTRUMENTS) ×2 IMPLANT
NDL SAFETY ECLIPSE 18X1.5 (NEEDLE) ×1 IMPLANT
NEEDLE HYPO 18GX1.5 SHARP (NEEDLE) ×2
NS IRRIG 500ML POUR BTL (IV SOLUTION) ×2 IMPLANT
PACK EXTREMITY ARMC (MISCELLANEOUS) ×2 IMPLANT
SOL PREP PVP 2OZ (MISCELLANEOUS) ×2
SOLUTION PREP PVP 2OZ (MISCELLANEOUS) ×1 IMPLANT
SPONGE T-LAP 18X18 ~~LOC~~+RFID (SPONGE) ×2 IMPLANT
STOCKINETTE STRL 6IN 960660 (GAUZE/BANDAGES/DRESSINGS) ×2 IMPLANT
SUT ETHILON 4-0 (SUTURE) ×4
SUT ETHILON 4-0 FS2 18XMFL BLK (SUTURE) ×2
SUT VIC AB 4-0 FS2 27 (SUTURE) ×4 IMPLANT
SUTURE ETHLN 4-0 FS2 18XMF BLK (SUTURE) ×2 IMPLANT
SYR 10ML LL (SYRINGE) ×4 IMPLANT
WATER STERILE IRR 500ML POUR (IV SOLUTION) ×2 IMPLANT

## 2022-06-06 NOTE — Transfer of Care (Signed)
Immediate Anesthesia Transfer of Care Note  Patient: [name]  Procedure(s) Performed: EXCISION PARTIAL PHALANX -[phone] (Left)  Patient Location: PACU  Anesthesia Type:General  Level of Consciousness: awake, alert  and oriented  Airway & Oxygen Therapy: Patient Spontanous Breathing  Post-op Assessment: Report given to RN and Post -op Vital signs reviewed and stable  Post vital signs: Reviewed and stable  Last Vitals:  Vitals Value Taken Time  BP 128/58 06/06/22 0825  Temp 36.1 C 06/06/22 0825  Pulse 73 06/06/22 0828  Resp 13 06/06/22 0828  SpO2 96 % 06/06/22 0828  Vitals shown include unvalidated device data.  Last Pain:  Vitals:   06/06/22 0633  TempSrc: Temporal  PainSc: 0-No pain      Patients Stated Pain Goal: 0 (02/54/27 0623)  Complications: No notable events documented.

## 2022-06-06 NOTE — Anesthesia Postprocedure Evaluation (Signed)
Anesthesia Post Note  Patient: [name]  Procedure(s) Performed: EXCISION PARTIAL PHALANX -[phone] (Left)  Patient location during evaluation: PACU Anesthesia Type: Combined General/Spinal Level of consciousness: awake and alert Pain management: pain level controlled Vital Signs Assessment: post-procedure vital signs reviewed and stable Respiratory status: spontaneous breathing, nonlabored ventilation, respiratory function stable and patient connected to nasal cannula oxygen Cardiovascular status: blood pressure returned to baseline and stable Postop Assessment: no apparent nausea or vomiting Anesthetic complications: no   No notable events documented.   Last Vitals:  Vitals:   06/06/22 0900 06/06/22 0918  BP: (!) 132/58 (!) 143/55  Pulse: 71 82  Resp: 11 14  Temp: (!) 36.4 C 36.9 C  SpO2: 97% 97%    Last Pain:  Vitals:   06/06/22 0918  TempSrc: Temporal  PainSc: 0-No pain                 Dimas Millin

## 2022-06-06 NOTE — Anesthesia Preprocedure Evaluation (Signed)
Anesthesia Evaluation  Patient identified by MRN, date of birth, ID band Patient awake    Reviewed: Allergy & Precautions, NPO status , Patient's Chart, lab work & pertinent test results  History of Anesthesia Complications Negative for: history of anesthetic complications  Airway Mallampati: I  TM Distance: >3 FB Neck ROM: full    Dental  (+) Teeth Intact   Pulmonary neg pulmonary ROS,    Pulmonary exam normal        Cardiovascular hypertension, Normal cardiovascular exam+ dysrhythmias + pacemaker + Valvular Problems/Murmurs AS      Neuro/Psych negative neurological ROS  negative psych ROS   GI/Hepatic Neg liver ROS, GERD  ,  Endo/Other  Hypothyroidism   Renal/GU      Musculoskeletal   Abdominal   Peds  Hematology negative hematology ROS (+)   Anesthesia Other Findings Past Medical History: 09/01/2019: Actinic keratosis     Comment:  right cheek 10/20/2019: Actinic keratosis     Comment:  left lat shin No date: Adenomatous colon polyp No date: Anemia 04/22/2021: Aortic valve stenosis     Comment:  a.) 04/22/2021: EF 55%, mild AS (MPG 12.2 mmHg) No date: Arthritis 07/09/2018: Basal cell carcinoma     Comment:  left upper forehead 09/01/2019: Basal cell carcinoma     Comment:  nasal dorsum No date: Cardiac murmur 10/2010: Complete heart block ([place])     Comment:  a.) s/p PPM placement in 10/2010; PG changed 10/29/2021 16/08/9603: Diastolic dysfunction     Comment:  a.) TTE 02/26/2017: EF 55%, mild LVH, mild LAE, mod RVE,              triv AR/PR, mild MR, mod TR, RVSP 63.4, G1DD; b.) TTE               03/02/2017: EF >55%, mod RVE, triv AR, mild MR/PR, mod               TR, RVSP 48; G1DD; c.) TTE 04/22/2021: EF 55%, mild LVH,               mild MAC, mild LAE, triv PR, mild MR, mod TR, mild AS               (MPG 12.2), RVSP 43, G1DD 10/20/2019: Dysplastic nevus     Comment:  right sup chest/moderate,  limited margins free No date: GERD (gastroesophageal reflux disease) No date: Hypertension No date: Hypothyroidism No date: LBBB (left bundle branch block) No date: Long term current use of immunosuppressive drug     Comment:  a.) MTX for scleroderma Dx 10/2010: Presence of permanent cardiac pacemaker 02/27/2016: Pulmonary HTN ([place])     Comment:  a.) TTE 12/15/2005: EF >55%, RVSP 33 mmHg; b.) TTE               02/26/2017: EF 55%, RVSP 63.4; c.) TTE 03/02/2017: EF               >55%, RVSP 48; d.) TTE 04/22/2021: EF 55%, RVSP 43; e.)               Tx'd with ACEi (analapril) + PDE5i (sildenafil) No date: Raynaud's disease No date: Scleroderma ([place])     Comment:  a.) FANA and  anti Ro Ab (+), mild interstitial change               on CT, normal barium swallow, sclerodactyly with               contracture,  iflammatory arthritis, mild pulmonary               hypertension; b.) on long term Tx using oral MTX 10/31/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     Comment:  right cheek Moh's 01/08/2021 10/2010: SSS (sick sinus syndrome) ([place])     Comment:  a.) s/p PPM placement in 10/2010; PG changed 10/29/2021 No date: Vitamin B 12 deficiency No date: Vitamin D deficiency  Past Surgical History: No date: ABDOMINAL HYSTERECTOMY; N/A 09/22/2016: COLONOSCOPY; N/A     Comment:  Procedure: COLONOSCOPY;  Surgeon: Manya Silvas, MD;               Location: [facility] ENDOSCOPY;  Service: Endoscopy;                Laterality: N/A;  Pacemaker; Diffuse Scleroderma 12/20/2001: COLONOSCOPY; N/A 07/12/2004: COLONOSCOPY; N/A 07/04/2011: COLONOSCOPY; N/A 08/11/2007: ESOPHAGOGASTRODUODENOSCOPY; N/A 10/2010: PACEMAKER INSERTION; N/A 10/29/2021: PPM GENERATOR CHANGEOUT; N/A     Comment:  Procedure: PPM GENERATOR CHANGEOUT;  Surgeon: Isaias Cowman, MD;  Location: [facility];  Service:              Cardiovascular;  Laterality: N/A; 1979: TUBAL LIGATION; Bilateral  BMI    Body Mass  Index: 23.59 kg/m      Reproductive/Obstetrics negative OB ROS                            Anesthesia Physical Anesthesia Plan  ASA: 3  Anesthesia Plan: General/Spinal   Post-op Pain Management:    Induction: Intravenous  PONV Risk Score and Plan: 3 and Ondansetron  Airway Management Planned: LMA  Additional Equipment:   Intra-op Plan:   Post-operative Plan: Extubation in OR  Informed Consent: I have reviewed the patients History and Physical, chart, labs and discussed the procedure including the risks, benefits and alternatives for the proposed anesthesia with the patient or authorized representative who has indicated his/her understanding and acceptance.     Dental Advisory Given  Plan Discussed with: Anesthesiologist, CRNA and Surgeon  Anesthesia Plan Comments: (Patient consented for risks of anesthesia including but not limited to:  - adverse reactions to medications - damage to eyes, teeth, lips or other oral mucosa - nerve damage due to positioning  - sore throat or hoarseness - Damage to heart, brain, nerves, lungs, other parts of body or loss of life  Patient voiced understanding.)        Anesthesia Quick Evaluation

## 2022-06-06 NOTE — Discharge Instructions (Addendum)
1.  Elevate the left leg on pillows.  2.  Keep the bandage on the left foot clean, dry, and do not remove.  3.  Sponge bathe only left lower extremity.  4.  Wear surgical shoe on the left foot whenever walking or standing.  5.  Take 1 pain pill, Norco, every 4 hours only if needed for pain.  AMBULATORY SURGERY  DISCHARGE INSTRUCTIONS   The drugs that you were given will stay in your system until tomorrow so for the next 24 hours you should not:  Drive an automobile Make any legal decisions Drink any alcoholic beverage   You may resume regular meals tomorrow.  Today it is better to start with liquids and gradually work up to solid foods.  You may eat anything you prefer, but it is better to start with liquids, then soup and crackers, and gradually work up to solid foods.   Please notify your doctor immediately if you have any unusual bleeding, trouble breathing, redness and pain at the surgery site, drainage, fever, or pain not relieved by medication.    Additional Instructions:        Please contact your physician with any problems or Same Day Surgery at [phone], Monday through Friday 6 am to 4 pm, or [facility] at [facility] number at [phone].

## 2022-06-06 NOTE — Interval H&P Note (Signed)
History and Physical Interval Note:  06/06/2022 7:03 AM  [name]  has presented today for surgery, with the diagnosis of M20.42 - Hammertoe M25.70 - Exostosis/Osteophyte.  The various methods of treatment have been discussed with the patient and family. After consideration of risks, benefits and other options for treatment, the patient has consented to  Procedure(s): [place] -[id number] (Left) as a surgical intervention.  The patient's history has been reviewed, patient examined, no change in status, stable for surgery.  I have reviewed the patient's chart and labs.  Questions were answered to the patient's satisfaction.     Durward Fortes

## 2022-06-06 NOTE — Op Note (Signed)
Date of operation: 06/06/2022.  Surgeon: Durward Fortes D.P.M.  Preoperative diagnosis: Hammertoe with exostosis left fifth toe.  Postoperative diagnosis: Same.  Procedure: Arthroplasty left fifth toe.  Anesthesia: LMA with local.  Hemostasis: Esmarch tourniquet left ankle.  Estimated blood loss: Less than 5 cc.  Pathology: None.  Operative complications: None apparent.  Operative indications.  This is a 76 year old female with chronic history of painful left fifth toe.  Patient elects for surgical correction.  Operative procedure: Patient was taken to the operating room and placed on the table in the supine position.  Following satisfactory LMA anesthesia the left fifth toe was anesthetized with 4 cc of 0.5% Marcaine plain.  The foot was then prepped and draped in the usual sterile fashion and foot was exsanguinated using an Esmarch which was then left around the ankle to act as a tourniquet. Attention was then directed to the left fifth toe where a linear incision was made coursing proximal to distal over the proximal phalanx and inner phalangeal joint.  Incision deepened down to the level of the joint where a transverse tenotomy was performed.  Head of the proximal phalanx was freed from surrounding tissues and was resected using a sagittal saw.  Soft tissue was freed from the lateral aspect of the middle and distal phalanx and the lateral portion of the middle phalanx was removed using sagittal saw.  Intraoperative FluoroScan views revealed good resection of the bony prominence.  The wound was flushed with copious amounts of sterile saline and closed using 4-0 Vicryl simple interrupted sutures for tendon reapproximation followed by skin closure using 5-0 nylon simple interrupted sutures.  Xeroform 4 x 4's and conformer applied to the left foot.  Tourniquet was released and blood flow noted return immediately to all digits.  Patient was awakened and transported to the PACU having tolerated the  anesthesia and procedures well.

## 2022-06-06 NOTE — H&P (Signed)
  Subjective: Patient presents for surgery for hammertoe on her left fifth toe.  Objective: Hammertoe contracture with palpable exostosis on the left fifth toe.  No evidence of ulceration.  Neurovascular status grossly intact.  Assessment: Hammertoe with exostosis left fifth toe.  Plan: Medical history and physical in the chart was reviewed.  Stable from prior podiatry exam in office.  Patient stable to proceed with surgery for arthroplasty left fifth toe.

## 2022-06-06 NOTE — Anesthesia Procedure Notes (Signed)
Procedure Name: LMA Insertion Date/Time: 06/06/2022 8:14 AM  Performed by: Eban Weick, CRNAPre-anesthesia Checklist: Patient identified, Patient being monitored, Timeout performed, Emergency Drugs available and Suction available Patient Re-evaluated:Patient Re-evaluated prior to induction Oxygen Delivery Method: Circle system utilized Preoxygenation: Pre-oxygenation with 100% oxygen Induction Type: IV induction Ventilation: Mask ventilation without difficulty LMA: LMA inserted LMA Size: 3.0 Tube type: Oral Number of attempts: 1 Placement Confirmation: positive ETCO2 and breath sounds checked- equal and bilateral Tube secured with: Tape Dental Injury: Teeth and Oropharynx as per pre-operative assessment

## 2022-06-12 DIAGNOSIS — Z9889 Other specified postprocedural states: Secondary | ICD-10-CM | POA: Diagnosis not present

## 2022-06-12 DIAGNOSIS — M2042 Other hammer toe(s) (acquired), left foot: Secondary | ICD-10-CM | POA: Diagnosis not present

## 2022-06-12 DIAGNOSIS — M2041 Other hammer toe(s) (acquired), right foot: Secondary | ICD-10-CM | POA: Diagnosis not present

## 2022-06-12 DIAGNOSIS — M898X9 Other specified disorders of bone, unspecified site: Secondary | ICD-10-CM | POA: Diagnosis not present

## 2022-06-24 DIAGNOSIS — Z862 Personal history of diseases of the blood and blood-forming organs and certain disorders involving the immune mechanism: Secondary | ICD-10-CM | POA: Diagnosis not present

## 2022-06-24 DIAGNOSIS — M349 Systemic sclerosis, unspecified: Secondary | ICD-10-CM | POA: Diagnosis not present

## 2022-06-24 DIAGNOSIS — Z7960 Long term (current) use of unspecified immunomodulators and immunosuppressants: Secondary | ICD-10-CM | POA: Insufficient documentation

## 2022-06-24 DIAGNOSIS — I272 Pulmonary hypertension, unspecified: Secondary | ICD-10-CM | POA: Diagnosis not present

## 2022-06-25 DIAGNOSIS — I495 Sick sinus syndrome: Secondary | ICD-10-CM | POA: Diagnosis not present

## 2022-07-10 DIAGNOSIS — B351 Tinea unguium: Secondary | ICD-10-CM | POA: Diagnosis not present

## 2022-07-10 DIAGNOSIS — M79675 Pain in left toe(s): Secondary | ICD-10-CM | POA: Diagnosis not present

## 2022-07-10 DIAGNOSIS — M79674 Pain in right toe(s): Secondary | ICD-10-CM | POA: Diagnosis not present

## 2022-07-30 DIAGNOSIS — Z8679 Personal history of other diseases of the circulatory system: Secondary | ICD-10-CM | POA: Diagnosis not present

## 2022-07-30 DIAGNOSIS — I495 Sick sinus syndrome: Secondary | ICD-10-CM | POA: Diagnosis not present

## 2022-07-30 DIAGNOSIS — Z950 Presence of cardiac pacemaker: Secondary | ICD-10-CM | POA: Diagnosis not present

## 2022-08-02 IMAGING — CR DG HAND COMPLETE 3+V*R*
3 series · 3 of 3 positions shown · non-contrast
Comparison: None.

CLINICAL DATA: Nonhealing MRSA positive ulceration of the right
thumb. Scleroderma.

EXAM:
RIGHT HAND - COMPLETE 3+ VIEW

[hand ap]
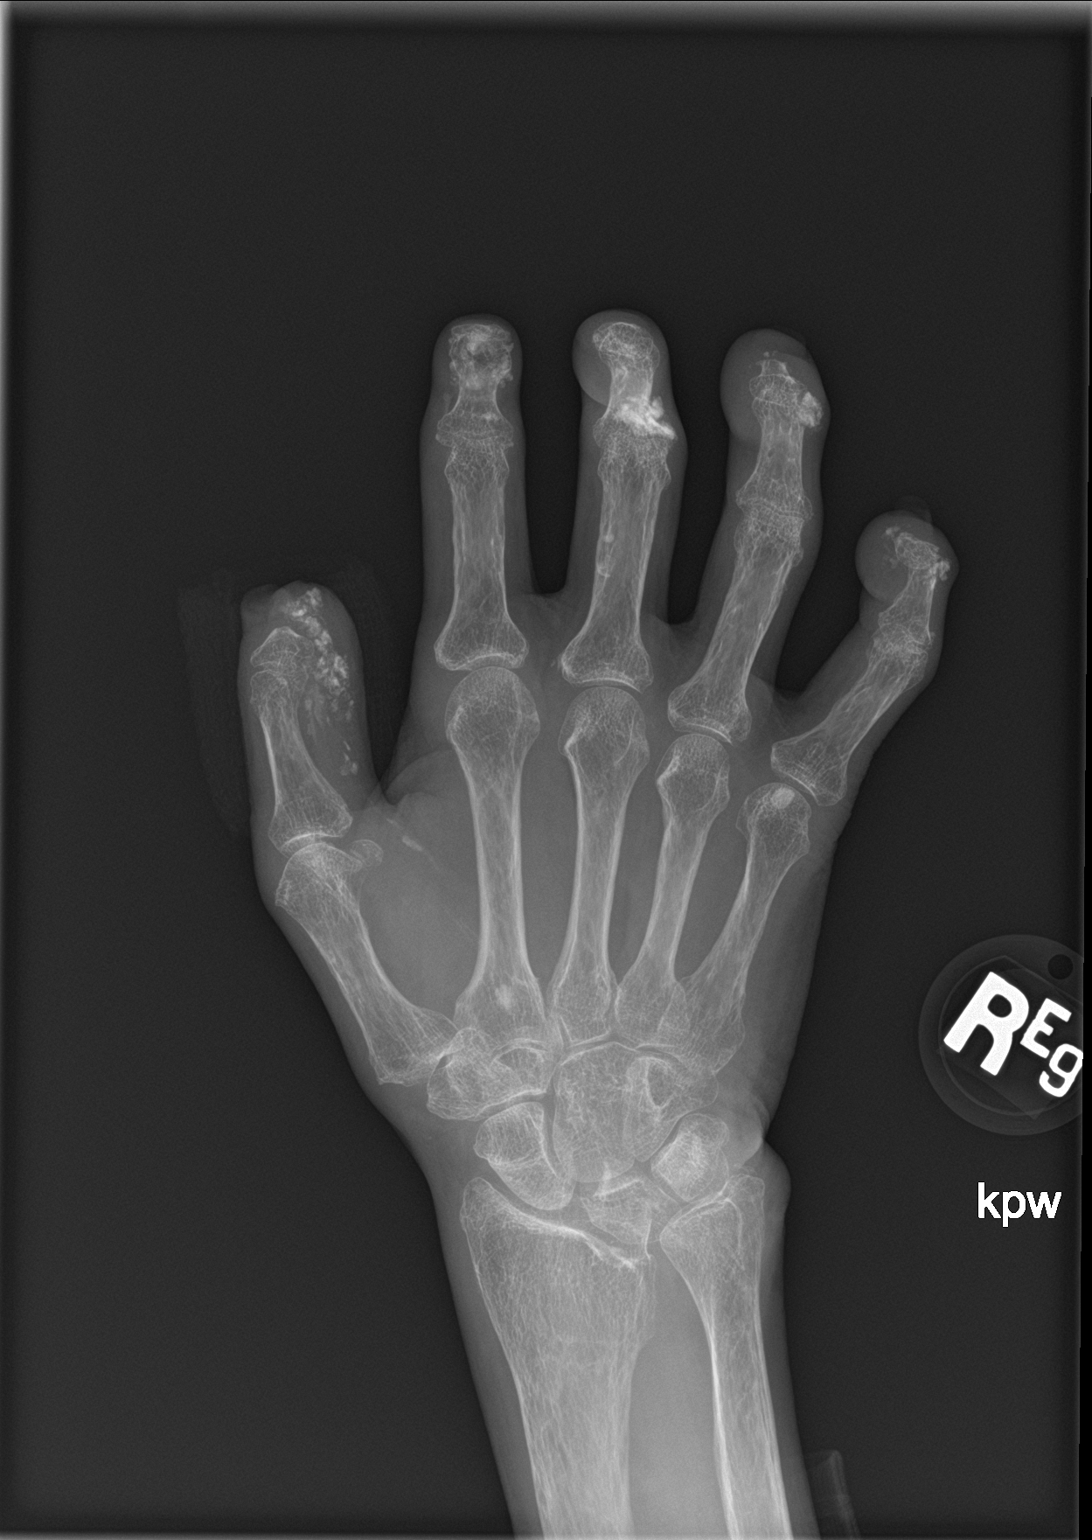

[hand obl]
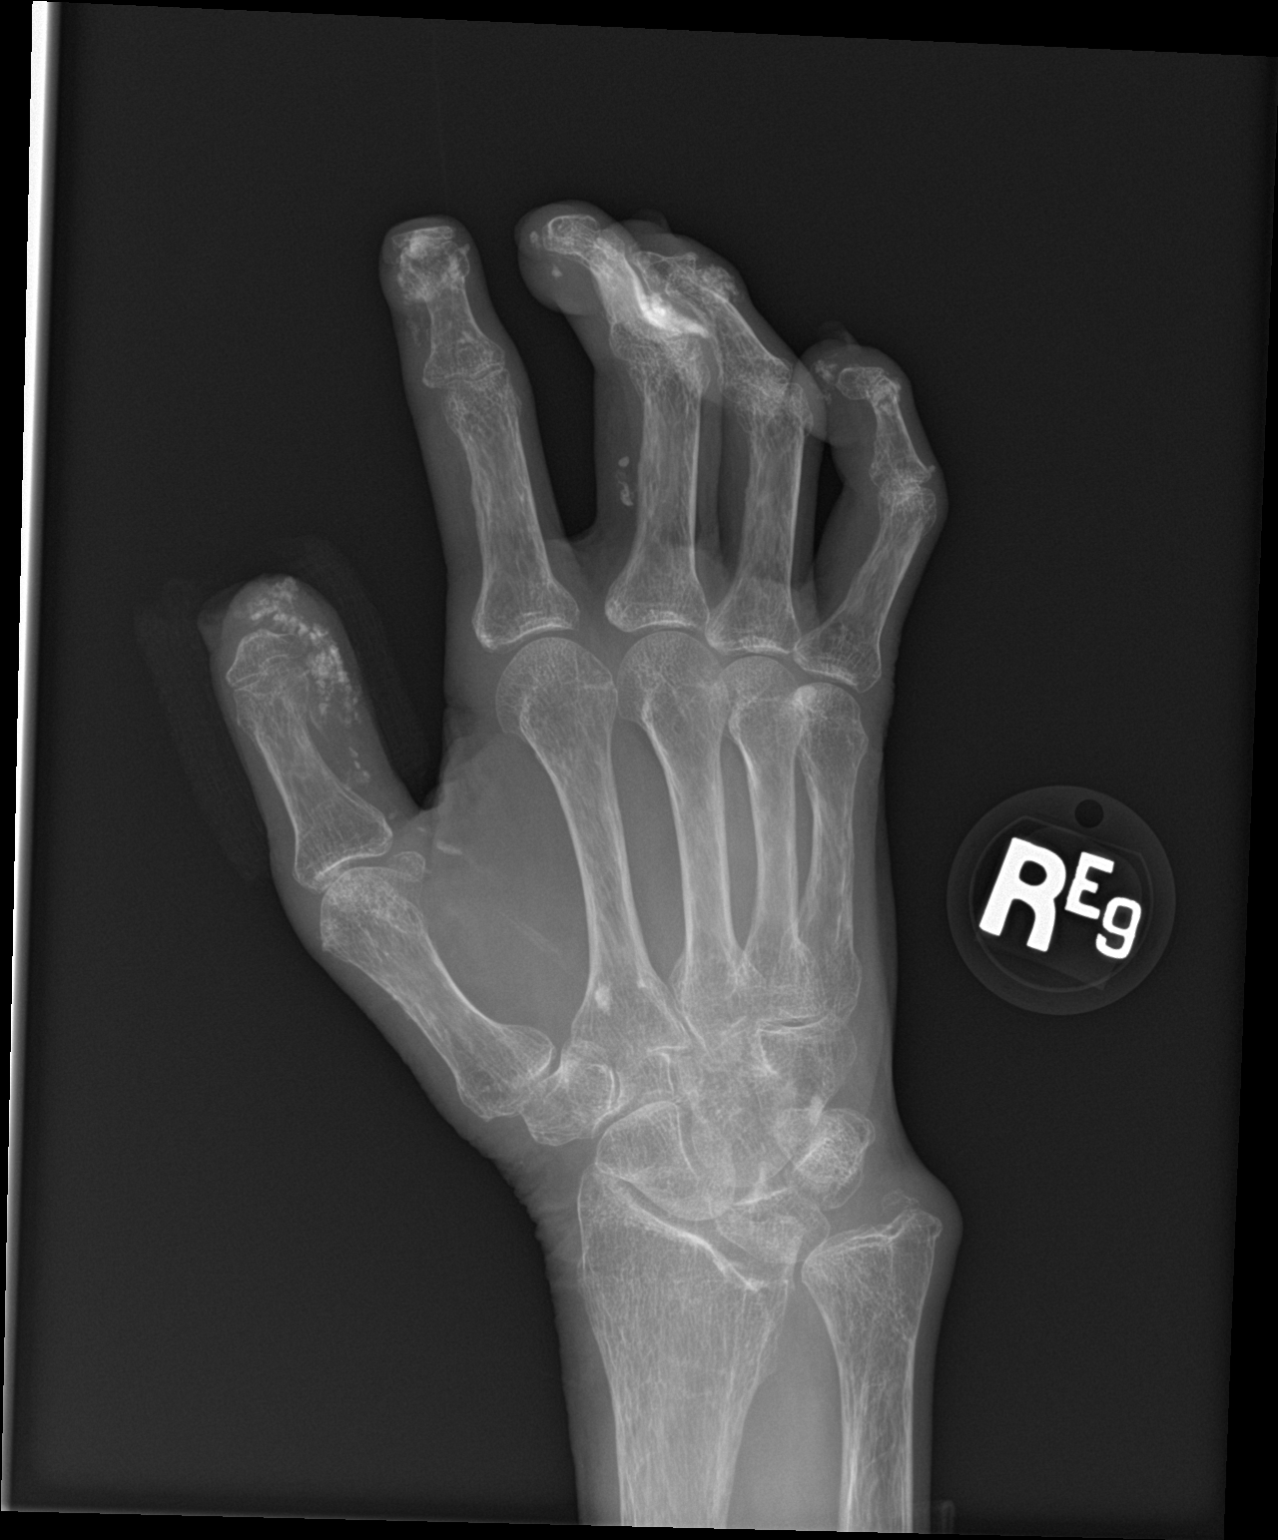

[hand lat]
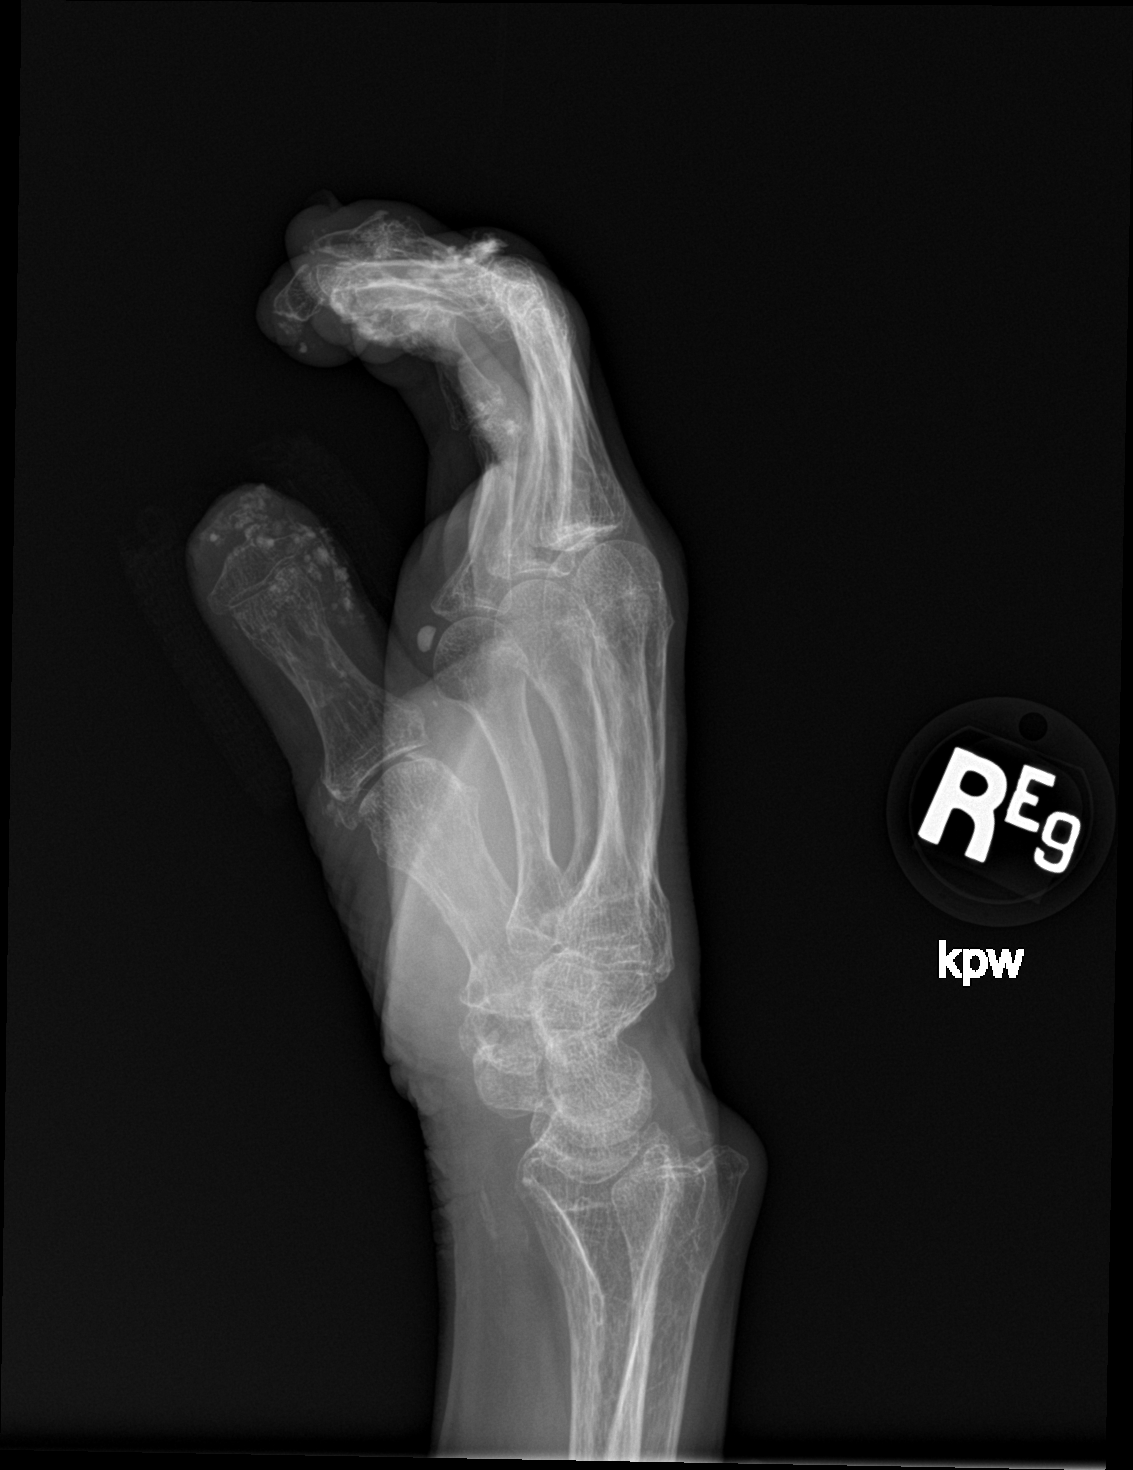

[3 of 3 positions shown; findings below may reference images not displayed]

FINDINGS: Status post amputation of the distal right thumb at the level of the
proximal aspect of the 1st distal phalanx. Distal soft tissue
irregularity. The bone margins are well corticated with no bone
destruction or periosteal reaction. Extensive soft tissue
calcifications are demonstrated in the thumb and in the fingers.
There is also a bone island in the proximal 2nd metacarpal. No
fracture or dislocation seen. Diffuse osteopenia.
IMPRESSION: 1. Distal thumb ulceration without evidence of underlying
osteomyelitis.
2. Soft tissue calcifications compatible with the clinical diagnosis
of scleroderma.

## 2022-08-11 ENCOUNTER — Other Ambulatory Visit: Payer: Self-pay | Admitting: Dermatology

## 2022-08-11 DIAGNOSIS — I7300 Raynaud's syndrome without gangrene: Secondary | ICD-10-CM

## 2022-08-27 ENCOUNTER — Ambulatory Visit: Payer: Medicare HMO | Admitting: Dermatology

## 2022-08-27 DIAGNOSIS — L578 Other skin changes due to chronic exposure to nonionizing radiation: Secondary | ICD-10-CM

## 2022-08-27 DIAGNOSIS — D489 Neoplasm of uncertain behavior, unspecified: Secondary | ICD-10-CM

## 2022-08-27 DIAGNOSIS — D225 Melanocytic nevi of trunk: Secondary | ICD-10-CM | POA: Diagnosis not present

## 2022-08-27 DIAGNOSIS — L570 Actinic keratosis: Secondary | ICD-10-CM | POA: Diagnosis not present

## 2022-08-27 DIAGNOSIS — L821 Other seborrheic keratosis: Secondary | ICD-10-CM

## 2022-08-27 MED ORDER — FLUOROURACIL 5 % EX CREA: TOPICAL_CREAM | Freq: Two times a day (BID) | CUTANEOUS | 0 refills | Status: DC

## 2022-08-27 NOTE — Progress Notes (Signed)
Follow-Up Visit   Subjective  [name] is a 76 y.o. female who presents for the following: Follow-up (AK follow up, Warts, recheck nevus).  Patient advises warts treated with LN2 did come off.   The following portions of the chart were reviewed this encounter and updated as appropriate:   Tobacco  Allergies  Meds  Problems  Med Hx  Surg Hx  Fam Hx      Review of Systems:  No other skin or systemic complaints except as noted in HPI or Assessment and Plan.  Objective  Well appearing patient in no apparent distress; mood and affect are within normal limits.  A focused examination was performed including face, ears, flank. Relevant physical exam findings are noted in the Assessment and Plan.  Left brow x 1, right mid forehead x 1, left cheek x 1, right medial cheek x 1 (4) Erythematous thin papules/macules with gritty scale.   Right Flank lateral to breast 0.25 cm medium to dark brown thin papule       Assessment & Plan  AK (actinic keratosis) (4) Left brow x 1, right mid forehead x 1, left cheek x 1, right medial cheek x 1  Actinic keratoses are precancerous spots that appear secondary to cumulative UV radiation exposure/sun exposure over time. They are chronic with expected duration over 1 year. A portion of actinic keratoses will progress to squamous cell carcinoma of the skin. It is not possible to reliably predict which spots will progress to skin cancer and so treatment is recommended to prevent development of skin cancer.  Recommend daily broad spectrum sunscreen SPF 30+ to sun-exposed areas, reapply every 2 hours as needed.  Recommend staying in the shade or wearing long sleeves, sun glasses (UVA+UVB protection) and wide brim hats (4-inch brim around the entire circumference of the hat). Call for new or changing lesions.  Hypertrophic at left brow, left cheek  Prior to procedure, discussed risks of blister formation, small wound, skin dyspigmentation, or  rare scar following cryotherapy. Recommend Vaseline ointment to treated areas while healing.   Destruction of lesion - Left brow x 1, right mid forehead x 1, left cheek x 1, right medial cheek x 1  Destruction method: cryotherapy   Informed consent: discussed and consent obtained   Lesion destroyed using liquid nitrogen: Yes   Cryotherapy cycles:  2 Outcome: patient tolerated procedure well with no complications   Post-procedure details: wound care instructions given    Neoplasm of uncertain behavior Right Flank lateral to breast  Epidermal / dermal shaving  Lesion diameter (cm):  0.3 Informed consent: discussed and consent obtained   Timeout: patient name, date of birth, surgical site, and procedure verified   Anesthesia: the lesion was anesthetized in a standard fashion   Anesthetic:  1% lidocaine w/ epinephrine 1-100,000 local infiltration Instrument used: flexible razor blade   Hemostasis achieved with: aluminum chloride   Outcome: patient tolerated procedure well   Post-procedure details: wound care instructions given   Additional details:  Mupirocin and a bandage applied  Specimen 1 - Surgical pathology Differential Diagnosis: r/o Atypia  Check Margins: No 0.25 cm medium to dark brown thin papule  Actinic Damage - Severe, confluent actinic changes with pre-cancerous actinic keratoses  - Severe, chronic, not at goal, secondary to cumulative UV radiation exposure over time - diffuse scaly erythematous macules and papules with underlying dyspigmentation - Discussed Prescription "Field Treatment" for Severe, Chronic Confluent Actinic Changes with Pre-Cancerous Actinic Keratoses Field treatment involves treatment  of an entire area of skin that has confluent Actinic Changes (Sun/ Ultraviolet light damage) and PreCancerous Actinic Keratoses by method of PhotoDynamic Therapy (PDT) and/or prescription Topical Chemotherapy agents such as 5-fluorouracil, 5-fluorouracil/calcipotriene,  and/or imiquimod.  The purpose is to decrease the number of clinically evident and subclinical PreCancerous lesions to prevent progression to development of skin cancer by chemically destroying early precancer changes that may or may not be visible.  It has been shown to reduce the risk of developing skin cancer in the treated area. As a result of treatment, redness, scaling, crusting, and open sores may occur during treatment course. One or more than one of these methods may be used and may have to be used several times to control, suppress and eliminate the PreCancerous changes. Discussed treatment course, expected reaction, and possible side effects. - Recommend daily broad spectrum sunscreen SPF 30+ to sun-exposed areas, reapply every 2 hours as needed.  - Staying in the shade or wearing long sleeves, sun glasses (UVA+UVB protection) and wide brim hats (4-inch brim around the entire circumference of the hat) are also recommended. - Call for new or changing lesions.  - Start 5-fluorouracil cream twice a day for 7 days to affected areas including nose.  Reviewed course of treatment and expected reaction.  Patient advised to expect inflammation and crusting and advised that erosions are possible.  Patient advised to be diligent with sun protection during and after treatment. Handout with details of how to apply medication and what to expect provided. Counseled to keep medication out of reach of children and pets.  Seborrheic Keratoses - Stuck-on, waxy, tan-brown papules and/or plaques  - Benign-appearing - Discussed benign etiology and prognosis. - Observe - Call for any changes  Return in about 3 months (around 11/27/2022) for AK follow up, TBSE.  Graciella Belton, RMA, am acting as scribe for Forest Gleason, MD .  Documentation: I have reviewed the above documentation for accuracy and completeness, and I agree with the above.  Forest Gleason, MD

## 2022-08-27 NOTE — Patient Instructions (Addendum)
Cryotherapy Aftercare  Wash gently with soap and water everyday.   Apply Vaseline and Band-Aid daily until healed.   - Start 5-fluorouracil cream twice a day for 7 days to affected areas including nose.  Reviewed course of treatment and expected reaction.  Patient advised to expect inflammation and crusting and advised that erosions are possible.  Patient advised to be diligent with sun protection during and after treatment. Handout with details of how to apply medication and what to expect provided. Counseled to keep medication out of reach of children and pets.   Due to recent changes in healthcare laws, you may see results of your pathology and/or laboratory studies on MyChart before the doctors have had a chance to review them. We understand that in some cases there may be results that are confusing or concerning to you. Please understand that not all results are received at the same time and often the doctors may need to interpret multiple results in order to provide you with the best plan of care or course of treatment. Therefore, we ask that you please give [name] 2 business days to thoroughly review all your results before contacting the office for clarification. Should we see a critical lab result, you will be contacted sooner.   If You Need Anything After Your Visit  If you have any questions or concerns for your doctor, please call our main line at [phone] and press option 4 to reach your doctor's medical assistant. If no one answers, please leave a voicemail as directed and we will return your call as soon as possible. Messages left after 4 pm will be answered the following business day.   You may also send [name] a message via [place]. We typically respond to MyChart messages within 1-2 business days.  For prescription refills, please ask your pharmacy to contact our office. Our fax number is [phone].  If you have an urgent issue when the clinic is closed that cannot wait until the  next business day, you can page your doctor at the number below.    Please note that while we do our best to be available for urgent issues outside of office hours, we are not available 24/7.   If you have an urgent issue and are unable to reach [name], you may choose to seek medical care at your doctor's office, retail clinic, urgent care center, or emergency room.  If you have a medical emergency, please immediately call 911 or go to the emergency department.  Pager Numbers  - Dr. Nehemiah Massed: [phone]  - Dr. Laurence Ferrari: [phone]  - Dr. Nicole Kindred: [phone]  In the event of inclement weather, please call our main line at [phone] for an update on the status of any delays or closures.  Dermatology Medication Tips: Please keep the boxes that topical medications come in in order to help keep track of the instructions about where and how to use these. Pharmacies typically print the medication instructions only on the boxes and not directly on the medication tubes.   If your medication is too expensive, please contact our office at [phone] option 4 or send [name] a message through [place].   We are unable to tell what your co-pay for medications will be in advance as this is different depending on your insurance coverage. However, we may be able to find a substitute medication at lower cost or fill out paperwork to get insurance to cover a needed medication.   If a prior authorization is required to get  your medication covered by your insurance company, please allow [name] 1-2 business days to complete this process.  Drug prices often vary depending on where the prescription is filled and some pharmacies may offer cheaper prices.  The website www.goodrx.com contains coupons for medications through different pharmacies. The prices here do not account for what the cost may be with help from insurance (it may be cheaper with your insurance), but the website can give you the price if you did not  use any insurance.  - You can print the associated coupon and take it with your prescription to the pharmacy.  - You may also stop by our office during regular business hours and pick up a GoodRx coupon card.  - If you need your prescription sent electronically to a different pharmacy, notify our office through [facility] or by phone at [phone] option 4.     Si Usted Necesita Algo Despus de Su Visita  Tambin puede enviarnos un mensaje a travs de Pharmacist, community. Por lo general respondemos a los mensajes de MyChart en el transcurso de 1 a 2 das hbiles.  Para renovar recetas, por favor pida a su farmacia que se ponga en contacto con nuestra oficina. Harland Dingwall de fax es [place] [phone].  Si tiene un asunto urgente cuando la clnica est cerrada y que no puede esperar hasta el siguiente da hbil, puede llamar/localizar a su doctor(a) al nmero que aparece a continuacin.   Por favor, tenga en cuenta que aunque hacemos todo lo posible para estar disponibles para asuntos urgentes fuera del horario de [place], no estamos disponibles las 24 horas del da, los 7 das de la [place].   Si tiene un problema urgente y no puede comunicarse con nosotros, puede optar por buscar atencin mdica  en el consultorio de su doctor(a), en una clnica privada, en un centro de atencin urgente o en una sala de emergencias.  Si tiene Engineering geologist, por favor llame inmediatamente al 911 o vaya a la sala de emergencias.  Nmeros de bper  - Dr. Nehemiah Massed: [phone]  - Dra. Moye: [phone]  - Dra. Nicole Kindred: [phone]  En caso de inclemencias del [place], por favor llame a Johnsie Kindred principal al [phone] para una actualizacin sobre el [place] de cualquier retraso o cierre.  Consejos para la medicacin en dermatologa: Por favor, guarde las cajas en las que vienen los medicamentos de uso tpico para ayudarle a seguir las instrucciones sobre dnde y cmo usarlos. Las farmacias  generalmente imprimen las instrucciones del medicamento slo en las cajas y no directamente en los tubos del [place].   Si su medicamento es muy caro, por favor, pngase en contacto con Zigmund Daniel llamando al [phone] y presione la opcin 4 o envenos un mensaje a travs de Pharmacist, community.   No podemos decirle cul ser su copago por los medicamentos por adelantado ya que esto es diferente dependiendo de la cobertura de su seguro. Sin embargo, es posible que podamos encontrar un medicamento sustituto a Electrical engineer un formulario para que el seguro cubra el medicamento que se considera necesario.   Si se requiere una autorizacin previa para que su compaa de seguros [place] su medicamento, por favor permtanos de 1 a 2 das hbiles para completar este proceso.  Los precios de los medicamentos varan con frecuencia dependiendo del Environmental consultant de dnde se surte la receta y alguna farmacias pueden ofrecer precios ms baratos.  El sitio web www.goodrx.com tiene cupones para medicamentos de Airline pilot. Los  precios aqu no tienen en cuenta lo que podra costar con la ayuda del seguro (puede ser ms barato con su seguro), pero el sitio web puede darle el precio si no utiliz Research scientist (physical sciences).  - Puede imprimir el cupn correspondiente y llevarlo con su receta a la farmacia.  - Tambin puede pasar por nuestra oficina durante el horario de atencin regular y Charity fundraiser una tarjeta de cupones de GoodRx.  - Si necesita que su receta se enve electrnicamente a una farmacia diferente, informe a nuestra oficina a travs de MyChart de  o por telfono llamando al [phone] y presione la opcin 4.

## 2022-08-29 ENCOUNTER — Encounter: Payer: Self-pay | Admitting: Dermatology

## 2022-09-01 ENCOUNTER — Telehealth: Payer: Self-pay

## 2022-09-01 NOTE — Telephone Encounter (Signed)
Patient advised bx showed dysplastic nevus with moderate atypia, will recheck at follow up. Lurlean Horns., RMA

## 2022-09-01 NOTE — Telephone Encounter (Signed)
-----   Message from [place], MD sent at 08/29/2022 11:59 AM EDT ----- Skin , right flank lateral to breast DYSPLASTIC JUNCTIONAL LENTIGINOUS NEVUS WITH MODERATE ATYPIA, LIMITED MARGINS FREE --> recheck within 6 months  This is a MODERATELY ATYPICAL MOLE. On the spectrum from normal mole to melanoma skin cancer, this is in between the two. - We need to recheck this area sometime in the next 6 months to be sure there is no evidence of the atypical mole coming back. If there is any color coming back, we would recommend repeating the biopsy to be sure the cells look normal.  - People who have a history of atypical moles do have a slightly increased risk of developing melanoma somewhere on the body, so a yearly full body skin exam by a dermatologist is recommended.  - Monthly self skin checks and daily sun protection are also recommended.  - Please call if you notice a dark spot coming back where this biopsy was taken.  - Please also call if you notice any new or changing spots anywhere else on the body before your follow-up visit.     MAs please call. Thank you!

## 2022-09-25 DIAGNOSIS — M778 Other enthesopathies, not elsewhere classified: Secondary | ICD-10-CM | POA: Diagnosis not present

## 2022-09-25 DIAGNOSIS — M2042 Other hammer toe(s) (acquired), left foot: Secondary | ICD-10-CM | POA: Diagnosis not present

## 2022-09-25 DIAGNOSIS — B351 Tinea unguium: Secondary | ICD-10-CM | POA: Diagnosis not present

## 2022-09-25 DIAGNOSIS — M79675 Pain in left toe(s): Secondary | ICD-10-CM | POA: Diagnosis not present

## 2022-09-25 DIAGNOSIS — M2041 Other hammer toe(s) (acquired), right foot: Secondary | ICD-10-CM | POA: Diagnosis not present

## 2022-09-25 DIAGNOSIS — M2011 Hallux valgus (acquired), right foot: Secondary | ICD-10-CM | POA: Diagnosis not present

## 2022-09-25 DIAGNOSIS — M79674 Pain in right toe(s): Secondary | ICD-10-CM | POA: Diagnosis not present

## 2022-10-24 DIAGNOSIS — Z7960 Long term (current) use of unspecified immunomodulators and immunosuppressants: Secondary | ICD-10-CM | POA: Diagnosis not present

## 2022-10-24 DIAGNOSIS — I2729 Other secondary pulmonary hypertension: Secondary | ICD-10-CM | POA: Diagnosis not present

## 2022-10-24 DIAGNOSIS — M349 Systemic sclerosis, unspecified: Secondary | ICD-10-CM | POA: Diagnosis not present

## 2022-11-11 ENCOUNTER — Other Ambulatory Visit: Payer: Self-pay | Admitting: Dermatology

## 2022-11-11 DIAGNOSIS — I7300 Raynaud's syndrome without gangrene: Secondary | ICD-10-CM

## 2022-11-20 DIAGNOSIS — Z79899 Other long term (current) drug therapy: Secondary | ICD-10-CM | POA: Diagnosis not present

## 2022-12-02 DIAGNOSIS — E039 Hypothyroidism, unspecified: Secondary | ICD-10-CM | POA: Diagnosis not present

## 2022-12-02 DIAGNOSIS — I10 Essential (primary) hypertension: Secondary | ICD-10-CM | POA: Diagnosis not present

## 2022-12-02 DIAGNOSIS — Z862 Personal history of diseases of the blood and blood-forming organs and certain disorders involving the immune mechanism: Secondary | ICD-10-CM | POA: Diagnosis not present

## 2022-12-02 DIAGNOSIS — I495 Sick sinus syndrome: Secondary | ICD-10-CM | POA: Diagnosis not present

## 2022-12-02 DIAGNOSIS — Z136 Encounter for screening for cardiovascular disorders: Secondary | ICD-10-CM | POA: Diagnosis not present

## 2022-12-11 ENCOUNTER — Encounter: Payer: Self-pay | Admitting: Dermatology

## 2022-12-11 ENCOUNTER — Ambulatory Visit: Payer: Medicare HMO | Admitting: Dermatology

## 2022-12-11 VITALS — BP 114/69 | HR 95

## 2022-12-11 DIAGNOSIS — L821 Other seborrheic keratosis: Secondary | ICD-10-CM | POA: Diagnosis not present

## 2022-12-11 DIAGNOSIS — D229 Melanocytic nevi, unspecified: Secondary | ICD-10-CM | POA: Diagnosis not present

## 2022-12-11 DIAGNOSIS — Z1283 Encounter for screening for malignant neoplasm of skin: Secondary | ICD-10-CM

## 2022-12-11 DIAGNOSIS — Z85828 Personal history of other malignant neoplasm of skin: Secondary | ICD-10-CM | POA: Diagnosis not present

## 2022-12-11 DIAGNOSIS — B078 Other viral warts: Secondary | ICD-10-CM | POA: Diagnosis not present

## 2022-12-11 DIAGNOSIS — C44712 Basal cell carcinoma of skin of right lower limb, including hip: Secondary | ICD-10-CM | POA: Diagnosis not present

## 2022-12-11 DIAGNOSIS — L578 Other skin changes due to chronic exposure to nonionizing radiation: Secondary | ICD-10-CM

## 2022-12-11 DIAGNOSIS — B353 Tinea pedis: Secondary | ICD-10-CM

## 2022-12-11 DIAGNOSIS — I7300 Raynaud's syndrome without gangrene: Secondary | ICD-10-CM

## 2022-12-11 DIAGNOSIS — L570 Actinic keratosis: Secondary | ICD-10-CM

## 2022-12-11 DIAGNOSIS — L814 Other melanin hyperpigmentation: Secondary | ICD-10-CM | POA: Diagnosis not present

## 2022-12-11 DIAGNOSIS — D492 Neoplasm of unspecified behavior of bone, soft tissue, and skin: Secondary | ICD-10-CM

## 2022-12-11 NOTE — Progress Notes (Signed)
Follow-Up Visit   Subjective  [name] is a 77 y.o. female who presents for the following: Annual Exam (HxBCC's, HxSCCis, HxAKs. S/P 5FU-Calcipotriene treatment to nose in October).  The patient presents for Total-Body Skin Exam (TBSE) for skin cancer screening and mole check.  The patient has spots, moles and lesions to be evaluated, some may be new or changing and the patient has concerns that these could be cancer.  The following portions of the chart were reviewed this encounter and updated as appropriate:  Tobacco  Allergies  Meds  Problems  Med Hx  Surg Hx  Fam Hx      Review of Systems: No other skin or systemic complaints except as noted in HPI or Assessment and Plan.   Objective  Well appearing patient in no apparent distress; mood and affect are within normal limits.  A full examination was performed including scalp, head, eyes, ears, nose, lips, neck, chest, axillae, abdomen, back, buttocks, bilateral upper extremities, bilateral lower extremities, hands, feet, fingers, toes, fingernails, and toenails. All findings within normal limits unless otherwise noted below.  Right Pretibial 0.6 cm scaly pink papule       Left Inguinal Fold x1 Verrucous papules -- Discussed viral etiology and contagion.   B/L Feet Scaling and maceration web spaces and over distal and lateral soles.   B/L hands Loss of multiple distal fingertip, red discoloration   Assessment & Plan   History of Squamous Cell Carcinoma in Situ of the Skin - No evidence of recurrence today - Recommend regular full body skin exams - Recommend daily broad spectrum sunscreen SPF 30+ to sun-exposed areas, reapply every 2 hours as needed.  - Call if any new or changing lesions are noted between office visits   History of Basal Cell Carcinoma of the Skin - No evidence of recurrence today - Recommend regular full body skin exams - Recommend daily broad spectrum sunscreen SPF 30+ to sun-exposed  areas, reapply every 2 hours as needed.  - Call if any new or changing lesions are noted between office visits   Lentigines - Scattered tan macules - Due to sun exposure - Benign-appearing, observe - Recommend daily broad spectrum sunscreen SPF 30+ to sun-exposed areas, reapply every 2 hours as needed. - Call for any changes  Seborrheic Keratoses - Stuck-on, waxy, tan-brown papules and/or plaques  - Benign-appearing - Discussed benign etiology and prognosis. - Observe - Call for any changes  Melanocytic Nevi - Tan-brown and/or pink-flesh-colored symmetric macules and papules - Benign appearing on exam today - Observation - Call clinic for new or changing moles - Recommend daily use of broad spectrum spf 30+ sunscreen to sun-exposed areas.   Hemangiomas - Red papules - Discussed benign nature - Observe - Call for any changes  Actinic Damage with PreCancerous Actinic Keratoses Counseling for Topical Chemotherapy Management: Patient exhibits: - Severe, confluent actinic changes with pre-cancerous actinic keratoses that is secondary to cumulative UV radiation exposure over time - Condition that is severe; chronic, not at goal. - diffuse scaly erythematous macules and papules with underlying dyspigmentation - Discussed Prescription "Field Treatment" topical Chemotherapy for Severe, Chronic Confluent Actinic Changes with Pre-Cancerous Actinic Keratoses Field treatment involves treatment of an entire area of skin that has confluent Actinic Changes (Sun/ Ultraviolet light damage) and PreCancerous Actinic Keratoses by method of PhotoDynamic Therapy (PDT) and/or prescription Topical Chemotherapy agents such as 5-fluorouracil, 5-fluorouracil/calcipotriene, and/or imiquimod.  The purpose is to decrease the number of clinically evident and subclinical PreCancerous  lesions to prevent progression to development of skin cancer by chemically destroying early precancer changes that may or may not  be visible.  It has been shown to reduce the risk of developing skin cancer in the treated area. As a result of treatment, redness, scaling, crusting, and open sores may occur during treatment course. One or more than one of these methods may be used and may have to be used several times to control, suppress and eliminate the PreCancerous changes. Discussed treatment course, expected reaction, and possible side effects. - Recommend daily broad spectrum sunscreen SPF 30+ to sun-exposed areas, reapply every 2 hours as needed.  - Staying in the shade or wearing long sleeves, sun glasses (UVA+UVB protection) and wide brim hats (4-inch brim around the entire circumference of the hat) are also recommended. - Call for new or changing lesions. - Start 5-fluorouracil cream twice a day for 7 days to affected areas including face.  Reviewed course of treatment and expected reaction.  Patient advised to expect inflammation and crusting and advised that erosions are possible.  Patient advised to be diligent with sun protection during and after treatment. Handout with details of how to apply medication and what to expect provided. Counseled to keep medication out of reach of children and pets.   Skin cancer screening performed today.  Neoplasm of skin Right Pretibial  Skin / nail biopsy Type of biopsy: tangential   Informed consent: discussed and consent obtained   Anesthesia: the lesion was anesthetized in a standard fashion   Anesthesia comment:  Area prepped with alcohol Anesthetic:  1% lidocaine w/ epinephrine 1-100,000 buffered w/ 8.4% NaHCO3 Instrument used: flexible razor blade   Hemostasis achieved with: pressure, aluminum chloride and electrodesiccation   Outcome: patient tolerated procedure well   Post-procedure details: wound care instructions given   Post-procedure details comment:  Ointment and small bandage applied  Specimen 1 - Surgical pathology Differential Diagnosis: R/O SCCis  Check  Margins: No  Other viral warts Left Inguinal Fold x1  Viral Wart (HPV) Counseling  Discussed viral / HPV (Human Papilloma Virus) etiology and risk of spread /infectivity to other areas of body as well as to other people.  Multiple treatments and methods may be required to clear warts and it is possible treatment may not be successful.  Treatment risks include discoloration; scarring and there is still potential for wart recurrence.  Destruction of lesion - Left Inguinal Fold x1  Destruction method: cryotherapy   Informed consent: discussed and consent obtained   Lesion destroyed using liquid nitrogen: Yes   Region frozen until ice ball extended beyond lesion: Yes   Outcome: patient tolerated procedure well with no complications   Post-procedure details: wound care instructions given   Additional details:  Prior to procedure, discussed risks of blister formation, small wound, skin dyspigmentation, or rare scar following cryotherapy. Recommend Vaseline ointment to treated areas while healing.   Tinea pedis of both feet B/L Feet  With onychomycosis  Restart ketoconazole cream twice daily to feet and in between toes until clear/about 1 month. Then use weekly to feet and between toes for prevention of recurrence given chronic onychomycosis.  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 Currently well-controlled.  Continue Sildenafil 20 mg 1/2 tablet 3 times daily as directed. She did not tolerate 20 mg TID. Continue amlodipine as prescribed by Dr. Catalina Lunger Rheumatology Continue Nitropaste 3 times daily as needed to finger webs  Return in about 3 months (around 03/12/2023) for AK Follow Up.  I, Emelia Salisbury, CMA, am acting as scribe for Forest Gleason, MD.  Documentation: I have reviewed the above documentation for accuracy and completeness, and I agree with the above.  Forest Gleason,  MD

## 2022-12-11 NOTE — Patient Instructions (Addendum)
FEET: Start ketoconazole cream twice daily to feet and in between toes until clear/about 1 month.   Continue Sildenafil 20 mg 1/2 tablet 3 times daily as directed Continue amlodipine as prescribed by Dr. Catalina Lunger [facility]umat[facility] Continue Nitropaste 3 times daily as needed to finger webs   - Start 5-fluorouracil cream twice a day for 7 days to affected areas including face.  Reviewed course of treatment and expected reaction.  Patient advised to expect inflammation and crusting and advised that erosions are possible.  Patient advised to be diligent with sun protection during and after treatment. Handout with details of how to apply medication and what to expect provided. Counseled to keep medication out of reach of children and pets.    Check with Dr. Posey Pronto: If Niacinamide or Nicotinamide '500mg'$  is okay to take.   Cryotherapy Aftercare  Wash gently with soap and water everyday.   Apply Vaseline and Band-Aid daily until healed.    Wound Care Instructions  Cleanse wound gently with soap and water once a day then pat dry with clean gauze. Apply a thin coat of Petrolatum (petroleum jelly, "Vaseline") over the wound (unless you have an allergy to this). We recommend that you use a new, sterile tube of Vaseline. Do not pick or remove scabs. Do not remove the yellow or white "healing tissue" from the base of the wound.  Cover the wound with fresh, clean, nonstick gauze and secure with paper tape. You may use Band-Aids in place of gauze and tape if the wound is small enough, but would recommend trimming much of the tape off as there is often too much. Sometimes Band-Aids can irritate the skin.  You should call the office for your biopsy report after 1 week if you have not already been contacted.  If you experience any problems, such as abnormal amounts of bleeding, swelling, significant bruising, significant pain, or evidence of infection, please call the office immediately.  FOR ADULT  SURGERY PATIENTS: If you need something for pain relief you may take 1 extra strength Tylenol (acetaminophen) AND 2 Ibuprofen ('200mg'$  each) together every 4 hours as needed for pain. (do not take these if you are allergic to them or if you have a reason you should not take them.) Typically, you may only need pain medication for 1 to 3 days.     Recommend taking Heliocare sun protection supplement daily in sunny weather for additional sun protection. For maximum protection on the sunniest days, you can take up to 2 capsules of regular Heliocare OR take 1 capsule of Heliocare Ultra. For prolonged exposure (such as a full day in the sun), you can repeat your dose of the supplement 4 hours after your first dose. Heliocare can be purchased at [employer], at some [facility] or at [email or web address].     Recommend daily broad spectrum sunscreen SPF 30+ to sun-exposed areas, reapply every 2 hours as needed. Call for new or changing lesions.  Staying in the shade or wearing long sleeves, sun glasses (UVA+UVB protection) and wide brim hats (4-inch brim around the entire circumference of the hat) are also recommended for sun protection.    Melanoma ABCDEs  Melanoma is the most dangerous type of skin cancer, and is the leading cause of death from skin disease.  You are more likely to develop melanoma if you: Have light-colored skin, light-colored eyes, or red or blond hair Spend a lot of time in the sun Tan regularly, either outdoors or in a tanning bed Have  had blistering sunburns, especially during childhood Have a close family member who has had a melanoma Have atypical moles or large birthmarks  Early detection of melanoma is key since treatment is typically straightforward and cure rates are extremely high if we catch it early.   The first sign of melanoma is often a change in a mole or a new dark spot.  The ABCDE system is a way of remembering the signs of melanoma.  A for asymmetry:  The  two halves do not match. B for border:  The edges of the growth are irregular. C for color:  A mixture of colors are present instead of an even brown color. D for diameter:  Melanomas are usually (but not always) greater than 41m - the size of a pencil eraser. E for evolution:  The spot keeps changing in size, shape, and color.  Please check your skin once per month between visits. You can use a small mirror in front and a large mirror behind you to keep an eye on the back side or your body.   If you see any new or changing lesions before your next follow-up, please call to schedule a visit.  Please continue daily skin protection including broad spectrum sunscreen SPF 30+ to sun-exposed areas, reapplying every 2 hours as needed when you're outdoors.   Staying in the shade or wearing long sleeves, sun glasses (UVA+UVB protection) and wide brim hats (4-inch brim around the entire circumference of the hat) are also recommended for sun protection.    Due to recent changes in healthcare laws, you may see results of your pathology and/or laboratory studies on MyChart before the doctors have had a chance to review them. We understand that in some cases there may be results that are confusing or concerning to you. Please understand that not all results are received at the same time and often the doctors may need to interpret multiple results in order to provide you with the best plan of care or course of treatment. Therefore, we ask that you please give u[place]2 business days to thoroughly review all your results before contacting the office for clarification. Should we see a critical lab result, you will be contacted sooner.   If You Need Anything After Your Visit  If you have any questions or concerns for your doctor, please call our main line at [phone] press option 4 to reach your doctor's medical assistant. If no one answers, please leave a voicemail as directed and we will return your call as  soon as possible. Messages left after 4 pm will be answered the following business day.   You may also send [place] message via [facility] We typically respond to MyChart messages within 1-2 business days.  For prescription refills, please ask your pharmacy to contact our office. Our fax number is [phone]  If you have an urgent issue when the clinic is closed that cannot wait until the next business day, you can page your doctor at the number below.    Please note that while we do our best to be available for urgent issues outside of office hours, we are not available 24/7.   If you have an urgent issue and are unable to reach [place] you may choose to seek medical care at your doctor's office, retail clinic, urgent care center, or emergency room.  If you have a medical emergency, please immediately call 911 or go to the emergency department.  Pager Numbers  -  Dr. Nehemiah Massed: [phone]  - Dr. Laurence Ferrari: [phone]  - Dr. Nicole Kindred: [phone]  In the event of inclement weather, please call our main line at [phone] for an update on the status of any delays or closures.  Dermatology Medication Tips: Please keep the boxes that topical medications come in in order to help keep track of the instructions about where and how to use these. Pharmacies typically print the medication instructions only on the boxes and not directly on the medication tubes.   If your medication is too expensive, please contact our office at [phone] option 4 or send [place] a message through [place].   We are unable to tell what your co-pay for medications will be in advance as this is different depending on your insurance coverage. However, we may be able to find a substitute medication at lower cost or fill out paperwork to get insurance to cover a needed medication.   If a prior authorization is required to get your medication covered by your insurance company, please allow [place] 1-2 business days to complete this  process.  Drug prices often vary depending on where the prescription is filled and some pharmacies may offer cheaper prices.  The website www.goodrx.com contains coupons for medications through different pharmacies. The prices here do not account for what the cost may be with help from insurance (it may be cheaper with your insurance), but the website can give you the price if you did not use any insurance.  - You can print the associated coupon and take it with your prescription to the pharmacy.  - You may also stop by our office during regular business hours and pick up a GoodRx coupon card.  - If you need your prescription sent electronically to a different pharmacy, notify our office through [facility] or by phone at [phone] option 4.     Si Usted Necesita Algo Despus de Su Visita  Tambin puede enviarnos un mensaje a travs de Pharmacist, community. Por lo general respondemos a los mensajes de MyChart en el transcurso de 1 a 2 das hbiles.  Para renovar recetas, por favor pida a su farmacia que se ponga en contacto con nuestra oficina. Harland Dingwall de fax es [place] [phone].  Si tiene un asunto urgente cuando la clnica est cerrada y que no puede esperar hasta el siguiente da hbil, puede llamar/localizar a su doctor(a) al nmero que aparece a continuacin.   Por favor, tenga en cuenta que aunque hacemos todo lo posible para estar disponibles para asuntos urgentes fuera del horario de [place], no estamos disponibles las 24 horas del da, los 7 das de la [place].   Si tiene un problema urgente y no puede comunicarse con nosotros, puede optar por buscar atencin mdica  en el consultorio de su doctor(a), en una clnica privada, en un centro de atencin urgente o en una sala de emergencias.  Si tiene Engineering geologist, por favor llame inmediatamente al 911 o vaya a la sala de emergencias.  Nmeros de bper  - Dr. Nehemiah Massed: [phone]  - Dra. Moye: [phone]  - Dra.  Nicole Kindred: [phone]  En caso de inclemencias del [place], por favor llame a Johnsie Kindred principal al [phone] para una actualizacin sobre el [place] de cualquier retraso o cierre.  Consejos para la medicacin en dermatologa: Por favor, guarde las cajas en las que vienen los medicamentos de uso tpico para ayudarle a seguir las instrucciones sobre dnde y cmo usarlos. [place] instrucciones del medicamento  slo en las cajas y no directamente en los tubos del medicamento.   Si su medicamento es muy caro, por favor, pngase en contacto con Zigmund Daniel llamando al [phone] y presione la opcin 4 o envenos un mensaje a travs de Pharmacist, community.   No podemos decirle cul ser su copago por los medicamentos por adelantado ya que esto es diferente dependiendo de la cobertura de su seguro. Sin embargo, es posible que podamos encontrar un medicamento sustituto a Electrical engineer un formulario para que el seguro cubra el medicamento que se considera necesario.   Si se requiere una autorizacin previa para que su compaa de seguros [place] su medicamento, por favor permtanos de 1 a 2 das hbiles para completar este proceso.  Los precios de los medicamentos varan con frecuencia dependiendo del Environmental consultant de dnde se surte la receta y alguna farmacias pueden ofrecer precios ms baratos.  El sitio web www.goodrx.com tiene cupones para medicamentos de Airline pilot. Los precios aqu no tienen en cuenta lo que podra costar con la ayuda del seguro (puede ser ms barato con su seguro), pero el sitio web puede darle el precio si no utiliz Research scientist (physical sciences).  - Puede imprimir el cupn correspondiente y llevarlo con su receta a la farmacia.  - Tambin puede pasar por nuestra oficina durante el horario de atencin regular y Charity fundraiser una tarjeta de cupones de GoodRx.  - Si necesita que su receta se enve electrnicamente a una farmacia diferente, informe a nuestra oficina a  travs de MyChart de [place] o por telfono llamando al [phone] y presione la opcin 4.

## 2022-12-12 DIAGNOSIS — Z862 Personal history of diseases of the blood and blood-forming organs and certain disorders involving the immune mechanism: Secondary | ICD-10-CM | POA: Diagnosis not present

## 2022-12-12 DIAGNOSIS — E039 Hypothyroidism, unspecified: Secondary | ICD-10-CM | POA: Diagnosis not present

## 2022-12-12 DIAGNOSIS — Z1331 Encounter for screening for depression: Secondary | ICD-10-CM | POA: Diagnosis not present

## 2022-12-12 DIAGNOSIS — Z0000 Encounter for general adult medical examination without abnormal findings: Secondary | ICD-10-CM | POA: Diagnosis not present

## 2022-12-12 DIAGNOSIS — I10 Essential (primary) hypertension: Secondary | ICD-10-CM | POA: Diagnosis not present

## 2022-12-17 ENCOUNTER — Telehealth: Payer: Self-pay

## 2022-12-17 NOTE — Telephone Encounter (Signed)
-----  Message from Alfonso Patten, MD sent at 12/17/2022 10:31 AM EST ----- Skin , right pretibial SUPERFICIAL BASAL CELL CARCINOMA --> ED&C  MAs please call with results and schedule. Please let me know if she has questions. Thank you!

## 2022-12-17 NOTE — Telephone Encounter (Signed)
Advised pt of bx results and scheduled pt for EDC./sh 

## 2022-12-29 DIAGNOSIS — B351 Tinea unguium: Secondary | ICD-10-CM | POA: Diagnosis not present

## 2022-12-29 DIAGNOSIS — M79674 Pain in right toe(s): Secondary | ICD-10-CM | POA: Diagnosis not present

## 2022-12-29 DIAGNOSIS — M79675 Pain in left toe(s): Secondary | ICD-10-CM | POA: Diagnosis not present

## 2023-01-13 ENCOUNTER — Encounter: Payer: Self-pay | Admitting: Dermatology

## 2023-01-13 ENCOUNTER — Ambulatory Visit: Payer: Medicare HMO | Admitting: Dermatology

## 2023-01-13 DIAGNOSIS — C44712 Basal cell carcinoma of skin of right lower limb, including hip: Secondary | ICD-10-CM | POA: Diagnosis not present

## 2023-01-13 NOTE — Progress Notes (Signed)
   Follow-Up Visit   Subjective  [name] is a 77 y.o. female who presents for the following: Procedure (Patient here today to treat bx proven BCC at right pretibial.).  The following portions of the chart were reviewed this encounter and updated as appropriate:   Tobacco  Allergies  Meds  Problems  Med Hx  Surg Hx  Fam Hx      Review of Systems:  No other skin or systemic complaints except as noted in HPI or Assessment and Plan.  Objective  Well appearing patient in no apparent distress; mood and affect are within normal limits.  A focused examination was performed including right leg. Relevant physical exam findings are noted in the Assessment and Plan.  right pretibial Pink bx site    Assessment & Plan  Basal cell carcinoma (BCC) of skin of right lower extremity including hip right pretibial  Destruction of lesion  Destruction method: electrodesiccation and curettage   Informed consent: discussed and consent obtained   Timeout:  patient name, date of birth, surgical site, and procedure verified Anesthesia: the lesion was anesthetized in a standard fashion   Anesthetic:  1% lidocaine w/ epinephrine 1-100,000 buffered w/ 8.4% NaHCO3 Curettage performed in three different directions: Yes   Electrodesiccation performed over the curetted area: Yes   Curettage cycles:  3 Final wound size (cm):  1.3 Hemostasis achieved with:  electrodesiccation Outcome: patient tolerated procedure well with no complications   Post-procedure details: sterile dressing applied and wound care instructions given   Dressing type: petrolatum     Return for as scheduled.  I, Margarette Asal, RMA, scribed for Alfonso Patten, MD.  Documentation: I have reviewed the above documentation for accuracy and completeness, and I agree with the above.  Forest Gleason, MD

## 2023-01-13 NOTE — Patient Instructions (Signed)
Wound Care Instructions  Cleanse wound gently with soap and water once a day then pat dry with clean gauze. Apply a thin coat of Petrolatum (petroleum jelly, "Vaseline") over the wound (unless you have an allergy to this). We recommend that you use a new, sterile tube of Vaseline. Do not pick or remove scabs. Do not remove the yellow or white "healing tissue" from the base of the wound.  Cover the wound with fresh, clean, nonstick gauze and secure with paper tape. You may use Band-Aids in place of gauze and tape if the wound is small enough, but would recommend trimming much of the tape off as there is often too much. Sometimes Band-Aids can irritate the skin.  You should call the office for your biopsy report after 1 week if you have not already been contacted.  If you experience any problems, such as abnormal amounts of bleeding, swelling, significant bruising, significant pain, or evidence of infection, please call the office immediately.  FOR ADULT SURGERY PATIENTS: If you need something for pain relief you may take 1 extra strength Tylenol (acetaminophen) AND 2 Ibuprofen (200mg each) together every 4 hours as needed for pain. (do not take these if you are allergic to them or if you have a reason you should not take them.) Typically, you may only need pain medication for 1 to 3 days.     Due to recent changes in healthcare laws, you may see results of your pathology and/or laboratory studies on MyChart before the doctors have had a chance to review them. We understand that in some cases there may be results that are confusing or concerning to you. Please understand that not all results are received at the same time and often the doctors may need to interpret multiple results in order to provide you with the best plan of care or course of treatment. Therefore, we ask that you please give us 2 business days to thoroughly review all your results before contacting the office for clarification. Should  we see a critical lab result, you will be contacted sooner.   If You Need Anything After Your Visit  If you have any questions or concerns for your doctor, please call our main line at [phone] and press option 4 to reach your doctor's medical assistant. If no one answers, please leave a voicemail as directed and we will return your call as soon as possible. Messages left after 4 pm will be answered the following business day.   You may also send us a message via MyChart. We typically respond to MyChart messages within 1-2 business days.  For prescription refills, please ask your pharmacy to contact our office. Our fax number is [phone].  If you have an urgent issue when the clinic is closed that cannot wait until the next business day, you can page your doctor at the number below.    Please note that while we do our best to be available for urgent issues outside of office hours, we are not available 24/7.   If you have an urgent issue and are unable to reach us, you may choose to seek medical care at your doctor's office, retail clinic, urgent care center, or emergency room.  If you have a medical emergency, please immediately call 911 or go to the emergency department.  Pager Numbers  - Dr. Kowalski: [phone]  - Dr. Moye: [phone]  - Dr. Stewart: [phone]  In the event of inclement weather, please call our main line at   [phone] for an update on the status of any delays or closures.  Dermatology Medication Tips: Please keep the boxes that topical medications come in in order to help keep track of the instructions about where and how to use these. Pharmacies typically print the medication instructions only on the boxes and not directly on the medication tubes.   If your medication is too expensive, please contact our office at [phone] option 4 or send us a message through MyChart.   We are unable to tell what your co-pay for medications will be in  advance as this is different depending on your insurance coverage. However, we may be able to find a substitute medication at lower cost or fill out paperwork to get insurance to cover a needed medication.   If a prior authorization is required to get your medication covered by your insurance company, please allow us 1-2 business days to complete this process.  Drug prices often vary depending on where the prescription is filled and some pharmacies may offer cheaper prices.  The website www.goodrx.com contains coupons for medications through different pharmacies. The prices here do not account for what the cost may be with help from insurance (it may be cheaper with your insurance), but the website can give you the price if you did not use any insurance.  - You can print the associated coupon and take it with your prescription to the pharmacy.  - You may also stop by our office during regular business hours and pick up a GoodRx coupon card.  - If you need your prescription sent electronically to a different pharmacy, notify our office through [facility] MyChart or by phone at [phone] option 4.     Si Usted Necesita Algo Despus de Su Visita  Tambin puede enviarnos un mensaje a travs de MyChart. Por lo general respondemos a los mensajes de MyChart en el transcurso de 1 a 2 das hbiles.  Para renovar recetas, por favor pida a su farmacia que se ponga en contacto con nuestra oficina. Nuestro nmero de fax es el [phone].  Si tiene un asunto urgente cuando la clnica est cerrada y que no puede esperar hasta el siguiente da hbil, puede llamar/localizar a su doctor(a) al nmero que aparece a continuacin.   Por favor, tenga en cuenta que aunque hacemos todo lo posible para estar disponibles para asuntos urgentes fuera del horario de oficina, no estamos disponibles las 24 horas del da, los 7 das de la semana.   Si tiene un problema urgente y no puede comunicarse con nosotros, puede  optar por buscar atencin mdica  en el consultorio de su doctor(a), en una clnica privada, en un centro de atencin urgente o en una sala de emergencias.  Si tiene una emergencia mdica, por favor llame inmediatamente al 911 o vaya a la sala de emergencias.  Nmeros de bper  - Dr. Kowalski: [phone]  - Dra. Moye: [phone]  - Dra. Stewart: [phone]  En caso de inclemencias del tiempo, por favor llame a nuestra lnea principal al [phone] para una actualizacin sobre el estado de cualquier retraso o cierre.  Consejos para la medicacin en dermatologa: Por favor, guarde las cajas en las que vienen los medicamentos de uso tpico para ayudarle a seguir las instrucciones sobre dnde y cmo usarlos. Las farmacias generalmente imprimen las instrucciones del medicamento slo en las cajas y no directamente en los tubos del medicamento.   Si su medicamento es muy caro, por favor, pngase en contacto con   nuestra oficina llamando al [phone] y presione la opcin 4 o envenos un mensaje a travs de MyChart.   No podemos decirle cul ser su copago por los medicamentos por adelantado ya que esto es diferente dependiendo de la cobertura de su seguro. Sin embargo, es posible que podamos encontrar un medicamento sustituto a menor costo o llenar un formulario para que el seguro cubra el medicamento que se considera necesario.   Si se requiere una autorizacin previa para que su compaa de seguros cubra su medicamento, por favor permtanos de 1 a 2 das hbiles para completar este proceso.  Los precios de los medicamentos varan con frecuencia dependiendo del lugar de dnde se surte la receta y alguna farmacias pueden ofrecer precios ms baratos.  El sitio web www.goodrx.com tiene cupones para medicamentos de diferentes farmacias. Los precios aqu no tienen en cuenta lo que podra costar con la ayuda del seguro (puede ser ms barato con su seguro), pero el sitio web puede darle el  precio si no utiliz ningn seguro.  - Puede imprimir el cupn correspondiente y llevarlo con su receta a la farmacia.  - Tambin puede pasar por nuestra oficina durante el horario de atencin regular y recoger una tarjeta de cupones de GoodRx.  - Si necesita que su receta se enve electrnicamente a una farmacia diferente, informe a nuestra oficina a travs de MyChart de [place] o por telfono llamando al [phone] y presione la opcin 4.  

## 2023-01-28 DIAGNOSIS — Z950 Presence of cardiac pacemaker: Secondary | ICD-10-CM | POA: Diagnosis not present

## 2023-01-28 DIAGNOSIS — I495 Sick sinus syndrome: Secondary | ICD-10-CM | POA: Diagnosis not present

## 2023-01-28 DIAGNOSIS — I350 Nonrheumatic aortic (valve) stenosis: Secondary | ICD-10-CM | POA: Diagnosis not present

## 2023-01-28 DIAGNOSIS — I10 Essential (primary) hypertension: Secondary | ICD-10-CM | POA: Diagnosis not present

## 2023-01-28 DIAGNOSIS — I272 Pulmonary hypertension, unspecified: Secondary | ICD-10-CM | POA: Diagnosis not present

## 2023-01-28 DIAGNOSIS — I38 Endocarditis, valve unspecified: Secondary | ICD-10-CM | POA: Diagnosis not present

## 2023-02-03 ENCOUNTER — Other Ambulatory Visit: Payer: Self-pay | Admitting: Dermatology

## 2023-02-03 DIAGNOSIS — I7300 Raynaud's syndrome without gangrene: Secondary | ICD-10-CM

## 2023-02-17 DIAGNOSIS — M349 Systemic sclerosis, unspecified: Secondary | ICD-10-CM | POA: Diagnosis not present

## 2023-02-17 DIAGNOSIS — S61402A Unspecified open wound of left hand, initial encounter: Secondary | ICD-10-CM | POA: Diagnosis not present

## 2023-02-19 ENCOUNTER — Encounter: Payer: Medicare HMO | Attending: Physician Assistant | Admitting: Physician Assistant

## 2023-02-19 DIAGNOSIS — I10 Essential (primary) hypertension: Secondary | ICD-10-CM | POA: Insufficient documentation

## 2023-02-19 DIAGNOSIS — I7300 Raynaud's syndrome without gangrene: Secondary | ICD-10-CM | POA: Insufficient documentation

## 2023-02-19 DIAGNOSIS — L98498 Non-pressure chronic ulcer of skin of other sites with other specified severity: Secondary | ICD-10-CM | POA: Diagnosis not present

## 2023-02-19 DIAGNOSIS — M3489 Other systemic sclerosis: Secondary | ICD-10-CM | POA: Insufficient documentation

## 2023-02-19 NOTE — Progress Notes (Signed)
[name], [name] ([id number]) [id number] Page 1 of 4 Visit Report for 02/19/2023 Abuse Risk Screen Details Patient Name: Date of Service: [name], [name] 02/19/2023 9:30 A M Medical Record Number: [id number] Patient Account Number: [id number] Date of Birth/Sex: Treating RN: February 08, 1946 (77 y.o. [name] Primary Care [name]: [name] Other Clinician: Referring [name]: Treating [name]/Extender: [name], [name]: 0 Abuse Risk Screen Items Answer ABUSE RISK SCREEN: Has anyone close to you tried to hurt or harm you recentlyo No Do you feel uncomfortable with anyone in your familyo No Has anyone forced you do things that you didnt want to doo No Electronic Signature(s) Signed: 02/19/2023 4:07:03 PM By: Tammy Coria RN Entered By: [name] on 02/19/2023 10:02:23 -------------------------------------------------------------------------------- Activities of Daily Living Details Patient Name: Date of Service: [name], [name] 02/19/2023 9:30 A M Medical Record Number: [id number] Patient Account Number: [id number] Date of Birth/Sex: Treating RN: February 26, 1946 (77 y.o. [name] Primary Care [name]: [name] Other Clinician: Referring [name]: Treating [name]: [name], [name]: 0 Activities of Daily Living Items Answer Activities of Daily Living (Please select one for each item) Drive Automobile Completely Able T Medications ake Completely Able Use T elephone Completely Able Care for Appearance Completely Able Use T oilet Completely Able Bath / Shower Completely Able Dress Self Completely Able Feed Self Completely Able Walk Completely Able Get In / Out Bed Completely Able Housework Completely Able Prepare Meals Completely Able Handle Money Completely Able Shop for Self Completely Able Electronic Signature(s) Signed: 02/19/2023 4:07:03 PM By: Tammy Coria  RN Entered By: [name] on 02/19/2023 10:02:42 -------------------------------------------------------------------------------- Education Screening Details Patient Name: Date of Service: Tammy Brightly Simmons. 02/19/2023 9:30 A M Medical Record Number: [id number] Patient Account Number: [id number] Date of Birth/Sex: Treating RN: 1946/01/02 (77 y.o. [name] Primary Care [name]: [name] Other Clinician: Referring Matyas Baisley: Treating [name]/Extender: [name], [name]: [id number] [place], ORCUTT Simmons ([id number]) [id number] Page 2 of 4 Primary Learner Assessed: Patient Learning Preferences/Education Level/Primary Language Learning Preference: Explanation Highest Education Level: High School Preferred Language: English Cognitive Barrier Language Barrier: No Translator Needed: No Memory Deficit: No Emotional Barrier: No Cultural/Religious Beliefs Affecting Medical Care: No Physical Barrier Impaired Vision: No Impaired Hearing: No Decreased Hand dexterity: No Knowledge/Comprehension Knowledge Level: Medium Comprehension Level: Medium Ability to understand written instructions: Medium Ability to understand verbal instructions: Medium Motivation Anxiety Level: Anxious Cooperation: Cooperative Education Importance: Acknowledges Need Interest in Health Problems: Asks Questions Perception: Coherent Willingness to Engage in Self-Management High Activities: Readiness to Engage in Self-Management High Activities: Electronic Signature(s) Signed: 02/19/2023 4:07:03 PM By: Tammy Coria RN Entered By: [name] on 02/19/2023 10:03:06 -------------------------------------------------------------------------------- Fall Risk Assessment Details Patient Name: Date of Service: [name], [name]. 02/19/2023 9:30 A M Medical Record Number: [id number] Patient Account Number: [id number] Date of Birth/Sex: Treating RN: [birth year] (77 y.o.  [name] Primary Care [name]: [name] Other Clinician: Referring [name]: Treating [name]/Extender: [name], [name]: 0 Fall Risk Assessment Items Have you had 2 or more falls in the last 12 monthso 0 No Have you had any fall that resulted in injury in the last 12 monthso 0 No FALLS RISK SCREEN History of falling - immediate or within 3 months 0 No Secondary diagnosis (Do you have 2 or more medical diagnoseso) 0 No Ambulatory aid None/bed rest/wheelchair/nurse 0 Yes Crutches/cane/walker 0 No Furniture 0 No Intravenous therapy Access/Saline/Heparin  Lock 0 No Gait/Transferring Normal/ bed rest/ wheelchair 0 Yes Weak (short steps with or without shuffle, stooped but able to lift head while walking, may seek 0 No support from furniture) Impaired (short steps with shuffle, may have difficulty arising from chair, head down, impaired 0 No balance) Mental Status Oriented to own ability 0 Yes Overestimates or forgets limitations 0 No Risk Level: Low Risk Score: 0 [name], [name] ([id number]) [phone] [id number].pdf Page 3 of 4 Electronic Signature(s) -------------------------------------------------------------------------------- Foot Assessment Details Patient Name: Date of Service: [name], [name] 02/19/2023 9:30 A M Medical Record Number: [id number] Patient Account Number: [id number] Date of Birth/Sex: Treating RN: 10-12-1946 (77 y.o. [name] Primary Care Maura Braaten: [name] Other Clinician: Referring Mekiah Wahler: Treating [name]/Extender: [name], [name]: 0 Foot Assessment Items Site Locations + = Sensation present, - = Sensation absent, C = Callus, U = Ulcer R = Redness, W = Warmth, M = Maceration, PU = Pre-ulcerative lesion Simmons = Fissure, S = Swelling, D = Dryness Assessment Right: Left: Other Deformity: No No Prior Foot Ulcer: No No Prior Amputation: No  No Charcot Joint: No No Ambulatory Status: Ambulatory Without Help Gait: Steady Electronic Signature(s) Signed: 02/19/2023 4:07:03 PM By: Tammy Coria RN Entered By: [name] on 02/19/2023 10:03:58 -------------------------------------------------------------------------------- Nutrition Risk Screening Details Patient Name: Date of Service: [name], [name] 02/19/2023 9:30 A M Medical Record Number: [id number] Patient Account Number: [id number] Date of Birth/Sex: Treating RN: [birth year] (77 y.o. [name] Primary Care Jove Beyl: [name] Other Clinician: Referring Weber Monnier: Treating Aileana Hodder/Extender: [name], [name]: 0 Height (in): 63 Weight (lbs): 125 Simmons Mass Index (BMI): 22.1 [name], [name] ([id number]) [phone] [id number].pdf Page 4 of 4 Nutrition Risk Screening Items Score Screening NUTRITION RISK SCREEN: I have an illness or condition that made me change the kind and/or amount of food I eat 0 No I eat fewer than two meals per day 0 No I eat few fruits and vegetables, or milk products 0 No I have three or more drinks of beer, liquor or wine almost every day 0 No I have tooth or mouth problems that make it hard for me to eat 0 No I don't always have enough money to buy the food I need 0 No I eat alone most of the time 0 No I take three or more different prescribed or over-the-counter drugs a day 1 Yes Without wanting to, I have lost or gained 10 pounds in the last six months 0 No I am not always physically able to shop, cook and/or feed myself 0 No Nutrition Protocols Good Risk Protocol 0 No interventions needed Moderate Risk Protocol High Risk Proctocol Risk Level: Good Risk Score: 1 Electronic Signature(s) Signed: 02/19/2023 4:07:03 PM By: Tammy Coria RN Entered By: [name] on 02/19/2023 10:03:50

## 2023-02-19 NOTE — Progress Notes (Signed)
SUONG, BIENAIME ([id number]) [id number] Page 1 of 7 Visit Report for 02/19/2023 Allergy List Details Patient Name: Date of Service: [name], [name] 02/19/2023 9:30 A M Medical Record Number: [id number] Patient Account Number: [id number] Date of Birth/Sex: Treating RN: Aug 26, 1946 (77 y.o. [name] Primary Care [name]: [name] Other Clinician: Referring [name]: Treating [name]/Extender: [name], [name] in Treatment: 0 Allergies Active Allergies penicillin Sulfa (Sulfonamide Antibiotics) Allergy Notes Electronic Signature(s) Signed: 02/19/2023 4:07:03 PM By: Tammy Coria RN Entered By: [name] on 02/19/2023 09:56:31 -------------------------------------------------------------------------------- Arrival Information Details Patient Name: Date of Service: [name], [name]. 02/19/2023 9:30 A M Medical Record Number: [id number] Patient Account Number: [id number] Date of Birth/Sex: Treating RN: 1946/05/28 (77 y.o. [name] Primary Care Kyrsten Deleeuw: [name] Other Clinician: Referring [name]: Treating [name]/Extender: [name], [name] in Treatment: 0 Visit Information Patient Arrived: Ambulatory Arrival Time: 09:54 Accompanied By: self Transfer Assistance: None Patient Identification Verified: Yes Secondary Verification Process Completed: Yes Patient Requires Transmission-Based Precautions: No Patient Has Alerts: Yes Patient Alerts: PACEMAKER Electronic Signature(s) Signed: 02/19/2023 10:19:46 AM By: Tammy Coria RN Entered By: [name] on 02/19/2023 10:19:46 -------------------------------------------------------------------------------- Clinic Level of Care Assessment Details Patient Name: Date of Service: [name], [name] 02/19/2023 9:30 A M Medical Record Number: [id number] Patient Account Number: [id number] Date of Birth/Sex: Treating RN: 26-Feb-1946 (77 y.o. [name] Primary Care  [name]: [name] Other Clinician: Referring [name]: Treating [name]/Extender: [name], [name] in Treatment: 0 Clinic Level of Care Assessment Items TOOL 2 Quantity Score X- 1 0 Use when only an EandM is performed on the INITIAL visit ASSESSMENTS - Nursing Assessment / Reassessment [name] ([id number]) [id number] Page 2 of 7 X- 1 20 General Physical Exam (combine w/ comprehensive assessment (listed just below) when performed on new pt. evals) X- 1 25 Comprehensive Assessment (HX, ROS, Risk Assessments, Wounds Hx, etc.) ASSESSMENTS - Wound and Skin A ssessment / Reassessment X - Simple Wound Assessment / Reassessment - one wound 1 5 []  - 0 Complex Wound Assessment / Reassessment - multiple wounds []  - 0 Dermatologic / Skin Assessment (not related to wound area) ASSESSMENTS - Ostomy and/or Continence Assessment and Care []  - 0 Incontinence Assessment and Management []  - 0 Ostomy Care Assessment and Management (repouching, etc.) PROCESS - Coordination of Care X - Simple Patient / Family Education for ongoing care 1 15 []  - 0 Complex (extensive) Patient / Family Education for ongoing care []  - 0 Staff obtains Programmer, systems, Records, T Results / Process Orders est []  - 0 Staff telephones HHA, Nursing Homes / Clarify orders / etc []  - 0 Routine Transfer to another Facility (non-emergent condition) []  - 0 Routine Hospital Admission (non-emergent condition) []  - 0 New Admissions / Biomedical engineer / Ordering NPWT Apligraf, etc. , []  - 0 Emergency Hospital Admission (emergent condition) X- 1 10 Simple Discharge Coordination []  - 0 Complex (extensive) Discharge Coordination PROCESS - Special Needs []  - 0 Pediatric / Minor Patient Management []  - 0 Isolation Patient Management []  - 0 Hearing / Language / Visual special needs []  - 0 Assessment of Community assistance (transportation, D/C planning, etc.) []  -  0 Additional assistance / Altered mentation []  - 0 Support Surface(s) Assessment (bed, cushion, seat, etc.) INTERVENTIONS - Wound Cleansing / Measurement X- 1 5 Wound Imaging (photographs - any number of wounds) []  - 0 Wound Tracing (instead of photographs) X- 1 5 Simple Wound Measurement - one wound []  -  0 Complex Wound Measurement - multiple wounds X- 1 5 Simple Wound Cleansing - one wound []  - 0 Complex Wound Cleansing - multiple wounds INTERVENTIONS - Wound Dressings X - Small Wound Dressing one or multiple wounds 1 10 []  - 0 Medium Wound Dressing one or multiple wounds []  - 0 Large Wound Dressing one or multiple wounds []  - 0 Application of Medications - injection INTERVENTIONS - Miscellaneous []  - 0 External ear exam []  - 0 Specimen Collection (cultures, biopsies, blood, Simmons fluids, etc.) []  - 0 Specimen(s) / Culture(s) sent or taken to Lab for analysis []  - 0 Patient Transfer (multiple staff / Harrel Lemon Lift / Similar devices) []  - 0 Simple Staple / Suture removal (25 or less) [name] ([id number]) [phone].pdf Page 3 of 7 []  - 0 Complex Staple / Suture removal (26 or more) []  - 0 Hypo / Hyperglycemic Management (close monitor of Blood Glucose) []  - 0 Ankle / Brachial Index (ABI) - do not check if billed separately Has the patient been seen at the hospital within the last three years: Yes Total Score: 100 Level Of Care: New/Established - Level 3 Electronic Signature(s) Signed: 02/19/2023 4:07:03 PM By: Tammy Coria RN Entered By: [name] on 02/19/2023 10:48:29 -------------------------------------------------------------------------------- Encounter Discharge Information Details Patient Name: Date of Service: [name], [name]. 02/19/2023 9:30 A M Medical Record Number: [id number] Patient Account Number: [id number] Date of Birth/Sex: Treating RN: Feb 15, 1946 (77 y.o. [name] Primary Care [name]: [name] Other  Clinician: Referring Kashmere Daywalt: Treating [name]/Extender: [name], [name] in Treatment: 0 Encounter Discharge Information Items Discharge Condition: Stable Ambulatory Status: Ambulatory Discharge Destination: Home Transportation: Private Auto Accompanied By: self Schedule Follow-up Appointment: Yes Clinical Summary of Care: Electronic Signature(s) Signed: 02/19/2023 4:07:03 PM By: Tammy Coria RN Entered By: [name] on 02/19/2023 10:49:04 -------------------------------------------------------------------------------- Lower Extremity Assessment Details Patient Name: Date of Service: [name], [name] 02/19/2023 9:30 A M Medical Record Number: [id number] Patient Account Number: [id number] Date of Birth/Sex: Treating RN: [birth year] (77 y.o. [name] Primary Care Peytan Andringa: [name] Other Clinician: Referring Kylena Mole: Treating Carlisle Torgeson/Extender: [name], [name] in Treatment: 0 Electronic Signature(s) Signed: 02/19/2023 4:07:03 PM By: Tammy Coria RN Entered By: [name] on 02/19/2023 10:10:34 -------------------------------------------------------------------------------- Multi Wound Chart Details Patient Name: Date of Service: [name], [name]. 02/19/2023 9:30 A M Medical Record Number: [id number] Patient Account Number: [id number] Date of Birth/Sex: Treating RN: 04-Mar-1946 (77 y.o. [name] Primary Care Deitrich Steve: [name] Other Clinician: Referring Aunisty Reali: Treating Eugune Sine/Extender: [name], [name] in Treatment: 0 Vital Signs Height(in): 63 Pulse(bpm): 84 Weight(lbs): 125 Blood Pressure(mmHg): 171/92 Simmons Mass Index(BMI): 22.1 Temperature(Simmons): 97.8 Respiratory Rate(breaths/min): [address] Akiya Simmons ([id number]) [id number] Page 4 of 7 [1:Photos:] [N/A:N/A] Left Hand - 4th Digit N/A N/A Wound Location: Gradually Appeared N/A N/A Wounding Event: Lesion N/A  N/A Primary Etiology: Hypertension, Raynauds, N/A N/A Comorbid History: Scleroderma 02/13/2023 N/A N/A Date Acquired: 0 N/A N/A Simmons of Treatment: Open N/A N/A Wound Status: No N/A N/A Wound Recurrence: Yes N/A N/A Pending A mputation on Presentation: 4x2.5x0.2 N/A N/A Measurements L x W x D (cm) 7.854 N/A N/A A (cm) : rea 1.571 N/A N/A Volume (cm) : Full Thickness Without Exposed N/A N/A Classification: Support Structures Medium N/A N/A Exudate A mount: Serosanguineous N/A N/A Exudate Type: red, brown N/A N/A Exudate Color: N/A N/A N/A Necrotic Amount: Eschar N/A N/A Necrotic Tissue: Fascia: No N/A N/A Exposed Structures: Fat Layer (Subcutaneous Tissue):  No Tendon: No Muscle: No Joint: No Bone: No None N/A N/A Epithelialization: Treatment Notes Electronic Signature(s) Signed: 02/19/2023 4:07:03 PM By: Tammy Coria RN Entered By: [name] on 02/19/2023 10:43:49 -------------------------------------------------------------------------------- [place] Details Patient Name: Date of Service: Tammy Brightly Simmons. 02/19/2023 9:30 A M Medical Record Number: [id number] Patient Account Number: [id number] Date of Birth/Sex: Treating RN: Jan 08, 1946 (77 y.o. [name] Primary Care Kenney Going: [name] Other Clinician: Referring Cher Egnor: Treating Wealthy Danielski/Extender: [name], [name] in Treatment: 0 Active Inactive Necrotic Tissue Nursing Diagnoses: Knowledge deficit related to management of necrotic/devitalized tissue Goals: Necrotic/devitalized tissue will be minimized in the wound bed Date Initiated: 02/19/2023 Target Resolution Date: 03/21/2023 Goal Status: Active Patient/caregiver will verbalize understanding of reason and process for debridement of necrotic tissue Date Initiated: 02/19/2023 Target Resolution Date: 03/21/2023 Goal Status: Active Interventions: [name], [name] ([id number])  [phone].pdf Page 5 of 7 Assess patient pain level pre-, during and post procedure and prior to discharge Treatment Activities: Apply topical anesthetic as ordered : 02/19/2023 Notes: Wound/Skin Impairment Nursing Diagnoses: Knowledge deficit related to ulceration/compromised skin integrity Goals: Patient/caregiver will verbalize understanding of skin care regimen Date Initiated: 02/19/2023 Target Resolution Date: 03/21/2023 Goal Status: Active Ulcer/skin breakdown will have a volume reduction of 30% by week 4 Date Initiated: 02/19/2023 Target Resolution Date: 03/21/2023 Goal Status: Active Ulcer/skin breakdown will have a volume reduction of 50% by week 8 Date Initiated: 02/19/2023 Target Resolution Date: 04/21/2023 Goal Status: Active Ulcer/skin breakdown will have a volume reduction of 80% by week 12 Date Initiated: 02/19/2023 Target Resolution Date: 05/21/2023 Goal Status: Active Ulcer/skin breakdown will heal within 14 Simmons Date Initiated: 02/19/2023 Target Resolution Date: 06/21/2023 Goal Status: Active Interventions: Assess patient/caregiver ability to obtain necessary supplies Assess patient/caregiver ability to perform ulcer/skin care regimen upon admission and as needed Assess ulceration(s) every visit Notes: Electronic Signature(s) Signed: 02/19/2023 4:07:03 PM By: Tammy Coria RN Entered By: [name] on 02/19/2023 10:12:22 -------------------------------------------------------------------------------- Pain Assessment Details Patient Name: Date of Service: Tammy Brightly Simmons. 02/19/2023 9:30 A M Medical Record Number: [id number] Patient Account Number: [id number] Date of Birth/Sex: Treating RN: 23-Mar-1946 (77 y.o. [name] Primary Care Shamone Winzer: [name] Other Clinician: Referring Lakrista Scaduto: Treating Xaviera Flaten/Extender: [name], [name] in Treatment: 0 Active Problems Location of Pain Severity and Description of Pain Patient  Has Paino Yes Site Locations With Dressing Change: Yes Duration of the Pain. Constant / Intermittento Constant Rate the pain. Current Pain Level: 9 Worst Pain Level: 10 Least Pain Level: 2 Character of Pain [name], [name] ([id number]) [id number] Page 6 of 7 Describe the Pain: Aching Pain Management and Medication Current Pain Management: Medication: Yes Cold Application: No Rest: Yes Massage: No Activity: No T.E.N.S.: No Heat Application: No Leg drop or elevation: No Is the Current Pain Management Adequate: Inadequate How does your wound impact your activities of daily livingo Sleep: Yes Bathing: No Appetite: No Relationship With Others: No Bladder Continence: No Emotions: No Bowel Continence: No Work: No Toileting: No Drive: No Dressing: No Hobbies: No Electronic Signature(s) Signed: 02/19/2023 4:07:03 PM By: Tammy Coria RN Entered By: [name] on 02/19/2023 09:55:40 -------------------------------------------------------------------------------- Patient/Caregiver Education Details Patient Name: Date of Service: [name], [name]. 4/4/2024andnbsp9:30 A M Medical Record Number: [id number] Patient Account Number: [id number] Date of Birth/Gender: Treating RN: 17-Jun-1946 (77 y.o. [name] Primary Care Physician: [name] Other Clinician: Referring Physician: Treating Physician/Extender: [name], [name] in Treatment: 0 Education Assessment Education Provided To: Patient  Education Topics Provided Welcome T [facility] Packet: o Handouts: Welcome T [facility] o Methods: Explain/Verbal Responses: State content correctly Electronic Signature(s) Signed: 02/19/2023 4:07:03 PM By: Tammy Coria RN Entered By: [name] on 02/19/2023 10:12:48 -------------------------------------------------------------------------------- Wound Assessment Details Patient Name: Date of  Service: [name] 02/19/2023 9:30 A M Medical Record Number: [id number] Patient Account Number: [id number] Date of Birth/Sex: Treating RN: 1946/11/14 (78 y.o. [name] Primary Care [name]: [name] Other Clinician: Referring Brittani Purdum: Treating Tarrin Menn/Extender: [name], [name] in Treatment: 0 Wound Status Wound Number: 1 Primary Etiology: Lesion Wound Location: Left Hand - 4th Digit Wound Status: Open Wounding Event: Gradually Appeared Comorbid History: Hypertension, Raynauds, Scleroderma Date Acquired: 02/13/2023 Simmons Of Treatment: 0 Clustered Wound: No [name], [name] ([id number]) [id number] Page 7 of 7 Photos Wound Measurements Length: (cm) 4 Width: (cm) 2.5 Depth: (cm) 0.2 Area: (cm) 7.854 Volume: (cm) 1.571 % Reduction in Area: % Reduction in Volume: Epithelialization: None Tunneling: No Undermining: No Wound Description Classification: Full Thickness Without Exposed Support Structures Exudate Amount: Medium Exudate Type: Serosanguineous Exudate Color: red, brown Foul Odor After Cleansing: No Slough/Fibrino Yes Wound Bed Necrotic Amount: Large (67-100%) Exposed Structure Necrotic Quality: Eschar Fascia Exposed: No Fat Layer (Subcutaneous Tissue) Exposed: No Tendon Exposed: No Muscle Exposed: No Joint Exposed: No Bone Exposed: No Electronic Signature(s) Signed: 02/19/2023 4:07:03 PM By: Tammy Coria RN Entered By: [name] on 02/19/2023 10:09:52 -------------------------------------------------------------------------------- [place] Details Patient Name: Date of Service: [name], [name]. 02/19/2023 9:30 A M Medical Record Number: [id number] Patient Account Number: [id number] Date of Birth/Sex: Treating RN: 1946/02/07 (77 y.o. [name] Primary Care Mayson Mcneish: [name] Other Clinician: Referring Danta Baumgardner: Treating Samul Mcinroy/Extender: [name], [name] in Treatment:  0 Vital Signs Time Taken: 09:55 Temperature (Simmons): 97.8 Height (in): 63 Pulse (bpm): 84 Source: Stated Respiratory Rate (breaths/min): 18 Weight (lbs): 125 Blood Pressure (mmHg): 171/92 Source: Stated Reference Range: 80 - 120 mg / dl Simmons Mass Index (BMI): 22.1 Electronic Signature(s) Signed: 02/19/2023 4:07:03 PM By: Tammy Coria RN Entered By: [name] on 02/19/2023 09:56:05

## 2023-02-20 NOTE — Progress Notes (Signed)
LAEL, WETHERBEE ([id number]) [id number] Page 1 of 6 Visit Report for 02/19/2023 Chief Complaint Document Details Patient Name: Date of Service: [name], [name] 02/19/2023 9:30 A M Medical Record Number: [id number] Patient Account Number: [id number] Date of Birth/Sex: Treating RN: [birth year] (77 y.o. Freddy Finner Primary Care Provider: Marisue Ivan Other Clinician: Referring Provider: Treating Provider/Extender: Nunzio Cobbs, Mayur Weeks in Treatment: 0 Information Obtained from: Patient Chief Complaint Left 4th finger ulcer Electronic Signature(s) Signed: 02/19/2023 10:39:05 AM By: Lenda Kelp PA-C Entered By: Lenda Kelp on 02/19/2023 10:39:05 -------------------------------------------------------------------------------- HPI Details Patient Name: Date of Service: [name], Tammy Hong Simmons. 02/19/2023 9:30 A M Medical Record Number: [id number] Patient Account Number: [id number] Date of Birth/Sex: Treating RN: 02-22-1946 (77 y.o. Freddy Finner Primary Care Provider: Marisue Ivan Other Clinician: Referring Provider: Treating Provider/Extender: Nunzio Cobbs, Mayur Weeks in Treatment: 0 History of Present Illness HPI Description: 02-19-2023 upon evaluation today patient presents today for initial evaluation here in our clinic concerning issues she has been having with wounds on her hands that come intermittently. With that being said currently she has a wound which actually began she says around February 13, 2023. Initially showed up as a white bump that then became what she feels like was pus filled. 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 Nonetheless her primary care provider did put her on the standard treatment that he starts whenever this happens that is prednisone in a tapering Dosepak which she is now on the 2 pills  a day for 3 days part of the regimen and then subsequently he also put her on doxycycline for 7 days total. With that being said there does appear to be some signs of potential release for infection and I do believe that this has been somewhat beneficial for her which is good news. I do not see any signs of active infection locally nor systemically at this time. The patient does have a history of scleroderma, Raynaud's syndrome, and hypertension. Electronic Signature(s) Signed: 02/19/2023 8:14:13 PM By: Lenda Kelp PA-C Entered By: Lenda Kelp on 02/19/2023 20:14:12 -------------------------------------------------------------------------------- Physical Exam Details Patient Name: Date of Service: Tammy Rushing Simmons. 02/19/2023 9:30 A M Medical Record Number: [id number] Patient Account Number: [id number] Date of Birth/Sex: Treating RN: Jan 17, 1946 (77 y.o. Freddy Finner Primary Care Provider: Marisue Ivan Other Clinician: Referring Provider: Treating Provider/Extender: Nunzio Cobbs, Mayur Weeks in Treatment: 0 Constitutional patient is hypertensive.. pulse regular and within target range for patient.Marland Kitchen respirations regular, non-labored and within target range for patient.Marland Kitchen temperature within target range for patient.. Well-nourished and well-hydrated in no acute distress. Eyes conjunctiva clear no eyelid edema noted. pupils equal round and reactive to light and accommodation. Ears, Nose, Mouth, and Throat AKSHARA, BLUMENTHAL Simmons ([id number]) [id number] Page 2 of 6 no gross abnormality of ear auricles or external auditory canals. normal hearing noted during conversation. mucus membranes moist. Respiratory normal breathing without difficulty. Musculoskeletal normal gait and posture. no significant deformity or arthritic changes, no loss or range of motion, no clubbing. Psychiatric this patient is able to make decisions and demonstrates good insight into  disease process. Alert and Oriented x 3. pleasant and cooperative. Notes Upon inspection patient's wounds actually appear to be significant at least the wound on her left hand which is at the fourth finger on the PIP joint location.  Based on what I am seeing currently she does seem to have some issues here with discomfort there is also some calcium deposits that I feel situated in this wound. I do believe this is likely recurrent issues with the scleroderma that is causing problems at this point I discussed with her that honestly this is going to be touching go situation. Obviously I want to make sure that she keeps the infection under control I am going to send in a repeat of the antibiotic for her at this point which she is in agreement with and we will subsequently see where things stand at follow-up. Electronic Signature(s) Signed: 02/19/2023 8:15:39 PM By: Lenda Kelp PA-C Entered By: Lenda Kelp on 02/19/2023 20:15:38 -------------------------------------------------------------------------------- Physician Orders Details Patient Name: Date of Service: Tammy Rushing Simmons. 02/19/2023 9:30 A M Medical Record Number: [id number] Patient Account Number: [id number] Date of Birth/Sex: Treating RN: 09/17/1946 (77 y.o. Freddy Finner Primary Care Provider: Marisue Ivan Other Clinician: Referring Provider: Treating Provider/Extender: Nunzio Cobbs, Mayur Weeks in Treatment: 0 Verbal / Phone Orders: No Diagnosis Coding ICD-10 Coding Code Description M34.89 Other systemic sclerosis L98.498 Non-pressure chronic ulcer of skin of other sites with other specified severity I73.00 Raynaud's syndrome without gangrene I10 Essential (primary) hypertension Follow-up Appointments Return Appointment in 1 week. Bathing/ Shower/ Hygiene May shower; gently cleanse wound with antibacterial soap, rinse and pat dry prior to dressing wounds Anesthetic (Use 'Patient Medications' Section for  Anesthetic Order Entry) Lidocaine applied to wound bed Wound Treatment Wound #1 - Hand - 4th Digit Wound Laterality: Left Cleanser: Soap and Water 1 x Per Day/30 Days Discharge Instructions: Gently cleanse wound with antibacterial soap, rinse and pat dry prior to dressing wounds Cleanser: Wound Cleanser 1 x Per Day/30 Days Discharge Instructions: Wash your hands with soap and water. Remove old dressing, discard into plastic bag and place into trash. Cleanse the wound with Wound Cleanser prior to applying a clean dressing using gauze sponges, not tissues or cotton balls. Do not scrub or use excessive force. Pat dry using gauze sponges, not tissue or cotton balls. Prim Dressing: Xeroform 5x9-HBD (in/in) 1 x Per Day/30 Days ary Discharge Instructions: Apply Xeroform 5x9-HBD (in/in) as directed Secondary Dressing: Gauze 1 x Per Day/30 Days Discharge Instructions: As directed: dry, moistened with saline or moistened with Dakins Solution Secured With: Medipore T - 29M Medipore H Soft Cloth Surgical T ape ape, 2x2 (in/yd) 1 x Per Day/30 Days Patient Medications llergies: penicillin, Sulfa (Sulfonamide Antibiotics) A EMANUEL, CAMPOS Simmons ([id number]) [id number] Page 3 of 6 Notifications Medication Indication Start End 02/19/2023 doxycycline hyclate DOSE 1 - oral 100 mg capsule - 1 capsule oral twice a day x 7 days. Do not take this medicine within 2 hours of vitamins or calcium Electronic Signature(s) Signed: 02/19/2023 10:51:19 AM By: Lenda Kelp PA-C Entered By: Lenda Kelp on 02/19/2023 10:51:18 -------------------------------------------------------------------------------- Problem List Details Patient Name: Date of Service: Tammy Rushing Simmons. 02/19/2023 9:30 A M Medical Record Number: [id number] Patient Account Number: [id number] Date of Birth/Sex: Treating RN: [birth year] (77 y.o. Freddy Finner Primary Care Provider: Marisue Ivan Other Clinician: Referring  Provider: Treating Provider/Extender: Nunzio Cobbs, Mayur Weeks in Treatment: 0 Active Problems ICD-10 Encounter Code Description Active Date MDM Diagnosis M34.89 Other systemic sclerosis 02/19/2023 No Yes L98.498 Non-pressure chronic ulcer of skin of other sites with other specified severity 02/19/2023 No Yes I73.00 Raynaud's syndrome without gangrene 02/19/2023 No Yes I10 Essential (primary) hypertension 02/19/2023 No Yes Inactive  Problems Resolved Problems Electronic Signature(s) Signed: 02/19/2023 10:38:47 AM By: Lenda KelpStone III, Anessia Oakland PA-C Entered By: Lenda KelpStone III, Mynor Witkop on 02/19/2023 10:38:47 -------------------------------------------------------------------------------- Progress Note Details Patient Name: Date of Service: Tammy QuillBA Simmons, Tammy HongJUDY Simmons. 02/19/2023 9:30 A M Medical Record Number: [id number] Patient Account Number: [id number] Date of Birth/Sex: Treating RN: [birth year] (77 y.o. Freddy FinnerF) Epps, Carrie Primary Care Provider: Marisue IvanLinthavong, Kanhka Other Clinician: Referring Provider: Treating Provider/Extender: Nunzio CobbsStone, Jolon Degante Patel, Mayur Weeks in Treatment: 0 Subjective Chief Complaint Information obtained from Patient Left 4th finger ulcer History of Present Illness (HPI) [name], [name] ([id number]) [id number] Page 4 of 6 02-19-2023 upon evaluation today patient presents today for initial evaluation here in our clinic concerning issues she has been having with wounds on her hands that come intermittently. With that being said currently she has a wound which actually began she says around February 13, 2023. Initially showed up as a white bump that then became what she feels like was pus filled. 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 Nonetheless her primary care provider did put her on the standard treatment that he starts whenever this happens that is  prednisone in a tapering Dosepak which she is now on the 2 pills a day for 3 days part of the regimen and then subsequently he also put her on doxycycline for 7 days total. With that being said there does appear to be some signs of potential release for infection and I do believe that this has been somewhat beneficial for her which is good news. I do not see any signs of active infection locally nor systemically at this time. The patient does have a history of scleroderma, Raynaud's syndrome, and hypertension. Patient History Allergies penicillin, Sulfa (Sulfonamide Antibiotics) Social History Never smoker, Marital Status - Married, Alcohol Use - Never, Drug Use - No History, Caffeine Use - Daily. Medical History Cardiovascular Patient has history of Hypertension Immunological Patient has history of Raynaudoos, Scleroderma Review of Systems (ROS) Integumentary (Skin) Complains or has symptoms of Wounds. Oncologic basal squamous Objective Constitutional patient is hypertensive.. pulse regular and within target range for patient.Marland Kitchen. respirations regular, non-labored and within target range for patient.Marland Kitchen. temperature within target range for patient.. Well-nourished and well-hydrated in no acute distress. Vitals Time Taken: 9:55 AM, Height: 63 in, Source: Stated, Weight: 125 lbs, Source: Stated, BMI: 22.1, Temperature: 97.8 Simmons, Pulse: 84 bpm, Respiratory Rate: 18 breaths/min, Blood Pressure: 171/92 mmHg. Eyes conjunctiva clear no eyelid edema noted. pupils equal round and reactive to light and accommodation. Ears, Nose, Mouth, and Throat no gross abnormality of ear auricles or external auditory canals. normal hearing noted during conversation. mucus membranes moist. Respiratory normal breathing without difficulty. Musculoskeletal normal gait and posture. no significant deformity or arthritic changes, no loss or range of motion, no clubbing. Psychiatric this patient is able to make  decisions and demonstrates good insight into disease process. Alert and Oriented x 3. pleasant and cooperative. General Notes: Upon inspection patient's wounds actually appear to be significant at least the wound on her left hand which is at the fourth finger on the PIP joint location. Based on what I am seeing currently she does seem to have some issues here with discomfort there is also some calcium deposits that I feel situated in this wound. I do believe this is likely recurrent issues with the scleroderma that is causing problems at this  point I discussed with her that honestly this is going to be touching go situation. Obviously I want to make sure that she keeps the infection under control I am going to send in a repeat of the antibiotic for her at this point which she is in agreement with and we will subsequently see where things stand at follow-up. Integumentary (Hair, Skin) Wound #1 status is Open. Original cause of wound was Gradually Appeared. The date acquired was: 02/13/2023. The wound is located on the Left Hand - 4th Digit. The wound measures 4cm length x 2.5cm width x 0.2cm depth; 7.854cm^2 area and 1.571cm^3 volume. There is no tunneling or undermining noted. There is a medium amount of serosanguineous drainage noted. There is a large (67-100%) amount of necrotic tissue within the wound bed including Eschar. Assessment Active Problems ICD-10 Other systemic sclerosis Non-pressure chronic ulcer of skin of other sites with other specified severity Raynaud's syndrome without gangrene Essential (primary) hypertension [name], [name] ([id number]) [id number] Page 5 of 6 Plan Follow-up Appointments: Return Appointment in 1 week. Bathing/ Shower/ Hygiene: May shower; gently cleanse wound with antibacterial soap, rinse and pat dry prior to dressing wounds Anesthetic (Use 'Patient Medications' Section for Anesthetic Order Entry): Lidocaine applied to wound  bed The following medication(s) was prescribed: doxycycline hyclate oral 100 mg capsule 1 1 capsule oral twice a day x 7 days. Do not take this medicine within 2 hours of vitamins or calcium starting 02/19/2023 WOUND #1: - Hand - 4th Digit Wound Laterality: Left Cleanser: Soap and Water 1 x Per Day/30 Days Discharge Instructions: Gently cleanse wound with antibacterial soap, rinse and pat dry prior to dressing wounds Cleanser: Wound Cleanser 1 x Per Day/30 Days Discharge Instructions: Wash your hands with soap and water. Remove old dressing, discard into plastic bag and place into trash. Cleanse the wound with Wound Cleanser prior to applying a clean dressing using gauze sponges, not tissues or cotton balls. Do not scrub or use excessive force. Pat dry using gauze sponges, not tissue or cotton balls. Prim Dressing: Xeroform 5x9-HBD (in/in) 1 x Per Day/30 Days ary Discharge Instructions: Apply Xeroform 5x9-HBD (in/in) as directed Secondary Dressing: Gauze 1 x Per Day/30 Days Discharge Instructions: As directed: dry, moistened with saline or moistened with Dakins Solution Secured With: Medipore T - 76M Medipore H Soft Cloth Surgical T ape ape, 2x2 (in/yd) 1 x Per Day/30 Days 1. Based on what I am seeing I do believe that the patient is monitoring for worsening overall she tells me that this has gotten pretty severe but she has had some pretty bad ones before. I am hopeful we will be able to get this healed right now I think we need to try to keep this area from drying out I would recommend Xeroform for her. 2. Also can recommend lightly wrapped gauze to secure in place. 3. I would also suggest that we actually have her continue with the antibiotics a little bit longer. I did actually send in the doxycycline for her today for an additional [address] she will have a full 14 days of medication. She is in agreement with continuing with this and we will see how things do. Obviously if anything  changes or worsen she knows to contact the office and let me know. My hope is that we will actually see a lot of improvement come next week. We will see patient back for reevaluation in 1 week here in the clinic. If anything worsens or changes  patient will contact our office for additional recommendations. Electronic Signature(s) Signed: 02/19/2023 8:16:35 PM By: Lenda KelpStone III, Skipper Dacosta PA-C Entered By: Lenda KelpStone III, Yoltzin Barg on 02/19/2023 20:16:35 -------------------------------------------------------------------------------- ROS/PFSH Details Patient Name: Date of Service: Tammy QuillBA Simmons, Tammy HongJUDY Simmons. 02/19/2023 9:30 A M Medical Record Number: [id number] Patient Account Number: [id number] Date of Birth/Sex: Treating RN: 01-Jul-1946 (77 y.o. Freddy FinnerF) Epps, Carrie Primary Care Provider: Marisue IvanLinthavong, Kanhka Other Clinician: Referring Provider: Treating Provider/Extender: Nunzio CobbsStone, Samoria Fedorko Patel, Mayur Weeks in Treatment: 0 Integumentary (Skin) Complaints and Symptoms: Positive for: Wounds Cardiovascular Medical History: Positive for: Hypertension Immunological Medical History: Positive for: Raynauds; Scleroderma Oncologic Complaints and Symptoms: Review of System Notes: basal squamous Immunizations [name], [name] ([id number]) [id number] Page 6 of 6 Pneumococcal Vaccine: Received Pneumococcal Vaccination: Yes Received Pneumococcal Vaccination On or After 60th Birthday: Yes Implantable Devices Yes Family and Social History Never smoker; Marital Status - Married; Alcohol Use: Never; Drug Use: No History; Caffeine Use: Daily Electronic Signature(s) Signed: 02/19/2023 4:07:03 PM By: Yevonne PaxEpps, Carrie RN Signed: 02/20/2023 8:47:01 AM By: Lenda KelpStone III, Shekela Goodridge PA-C Entered By: Yevonne PaxEpps, Carrie on 02/19/2023 10:02:17 -------------------------------------------------------------------------------- SuperBill Details Patient Name: Date of Service: Tammy QuillBA Simmons, Tammy HongJUDY Simmons. 02/19/2023 Medical Record Number: [id number] Patient  Account Number: [id number] Date of Birth/Sex: Treating RN: 01-Jul-1946 (77 y.o. Freddy FinnerF) Epps, Carrie Primary Care Provider: Marisue IvanLinthavong, Kanhka Other Clinician: Referring Provider: Treating Provider/Extender: Nunzio CobbsStone, Saifan Rayford Patel, Mayur Weeks in Treatment: 0 Diagnosis Coding ICD-10 Codes Code Description M34.89 Other systemic sclerosis L98.498 Non-pressure chronic ulcer of skin of other sites with other specified severity I73.00 Raynaud's syndrome without gangrene I10 Essential (primary) hypertension Facility Procedures : CPT4 Code: [id number] Description: 99213 - WOUND CARE VISIT-LEV 3 EST PT Modifier: Quantity: 1 Physician Procedures : CPT4 Code Description Modifier [id number] WC PHYS LEVEL 3 NEW PT ICD-10 Diagnosis Description M34.89 Other systemic sclerosis L98.498 Non-pressure chronic ulcer of skin of other sites with other specified severity I73.00 Raynaud's syndrome without  gangrene I10 Essential (primary) hypertension Quantity: 1 Electronic Signature(s) Signed: 02/19/2023 8:17:05 PM By: Lenda KelpStone III, Adilene Areola PA-C Previous Signature: 02/19/2023 4:07:03 PM Version By: Yevonne PaxEpps, Carrie RN Entered By: Lenda KelpStone III, Kenzie Thoreson on 02/19/2023 20:17:05

## 2023-02-26 ENCOUNTER — Encounter: Payer: Medicare HMO | Admitting: Physician Assistant

## 2023-02-26 DIAGNOSIS — I10 Essential (primary) hypertension: Secondary | ICD-10-CM | POA: Diagnosis not present

## 2023-02-26 DIAGNOSIS — M3489 Other systemic sclerosis: Secondary | ICD-10-CM | POA: Diagnosis not present

## 2023-02-26 DIAGNOSIS — L98498 Non-pressure chronic ulcer of skin of other sites with other specified severity: Secondary | ICD-10-CM | POA: Diagnosis not present

## 2023-02-26 DIAGNOSIS — I7300 Raynaud's syndrome without gangrene: Secondary | ICD-10-CM | POA: Diagnosis not present

## 2023-02-26 DIAGNOSIS — L98492 Non-pressure chronic ulcer of skin of other sites with fat layer exposed: Secondary | ICD-10-CM | POA: Diagnosis not present

## 2023-02-26 NOTE — Progress Notes (Addendum)
VENBA, ZENNER ([id number]) [id number] Page 1 of 5 Visit Report for 02/26/2023 Chief Complaint Document Details Patient Name: Date of Service: [name], [name] 02/26/2023 8:45 A M Medical Record Number: [id number] Patient Account Number: [id number] Date of Birth/Sex: Treating RN: [birth year] (77 y.o. Freddy Finner Primary Care Provider: Marisue Ivan Other Clinician: Referring Provider: Treating Provider/Extender: Rebecka Apley in Treatment: 1 Information Obtained from: Patient Chief Complaint Left 4th finger ulcer Electronic Signature(s) Signed: 02/26/2023 9:27:03 AM By: Allen Derry PA-C Entered By: Allen Derry on 02/26/2023 09:27:03 -------------------------------------------------------------------------------- HPI Details Patient Name: Date of Service: [name], Tammy Hong F. 02/26/2023 8:45 A M Medical Record Number: [id number] Patient Account Number: [id number] Date of Birth/Sex: Treating RN: 08-Oct-1946 (77 y.o. Freddy Finner Primary Care Provider: Marisue Ivan Other Clinician: Referring Provider: Treating Provider/Extender: Maxwell Caul Weeks in Treatment: 1 History of Present Illness HPI Description: 02-19-2023 upon evaluation today patient presents today for initial evaluation here in our clinic concerning issues she has been having with wounds on her hands that come intermittently. With that being said currently she has a wound which actually began she says around February 13, 2023. Initially showed up as a white bump that then became what she feels like was pus filled. 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 Nonetheless her primary care provider did put her on the standard treatment that he starts whenever this happens that is prednisone in a tapering Dosepak which she is now on the 2  pills a day for 3 days part of the regimen and then subsequently he also put her on doxycycline for 7 days total. With that being said there does appear to be some signs of potential release for infection and I do believe that this has been somewhat beneficial for her which is good news. I do not see any signs of active infection locally nor systemically at this time. The patient does have a history of scleroderma, Raynaud's syndrome, and hypertension. 02-27-2023 upon evaluation today patient appears to be doing well currently in regard to her finger ulcer. This actually looks to be much less inflamed compared to where we have been. Fortunately I do not see any signs of active infection locally nor systemically the wound looks better and smaller it also looks much less irritated compared to where it was I see no signs of active infection at this point all of which is excellent news. Electronic Signature(s) Signed: 02/27/2023 7:13:51 AM By: Allen Derry PA-C Entered By: Allen Derry on 02/27/2023 07:13:51 -------------------------------------------------------------------------------- Physical Exam Details Patient Name: Date of Service: [name]. 02/26/2023 8:45 A M Medical Record Number: [id number] Patient Account Number: [id number] Date of Birth/Sex: Treating RN: 12-19-1945 (77 y.o. Freddy Finner Primary Care Provider: Marisue Ivan Other Clinician: Referring Provider: Treating Provider/Extender: Maxwell Caul Weeks in Treatment: 1 Constitutional Well-nourished and well-hydrated in no acute distress. Respiratory [name] ([id number]) [id number] Page 2 of 5 normal breathing without difficulty. Psychiatric this patient is able to make decisions and demonstrates good insight into disease process. Alert and Oriented x 3. pleasant and cooperative. Notes Upon inspection patient's wound bed actually showed signs of good granulation  epithelization at this point. Fortunately I do not see any signs of active infection locally nor systemically which is great news and in general I do believe that  the patient is making good progress here. Electronic Signature(s) Signed: 02/27/2023 7:14:11 AM By: Allen Derry PA-C Entered By: Allen Derry on 02/27/2023 07:14:11 -------------------------------------------------------------------------------- Physician Orders Details Patient Name: Date of Service: [name], Tammy Hong F. 02/26/2023 8:45 A M Medical Record Number: [id number] Patient Account Number: [id number] Date of Birth/Sex: Treating RN: 09-16-1946 (77 y.o. Freddy Finner Primary Care Provider: Marisue Ivan Other Clinician: Referring Provider: Treating Provider/Extender: Rebecka Apley in Treatment: 1 Verbal / Phone Orders: No Diagnosis Coding ICD-10 Coding Code Description M34.89 Other systemic sclerosis L98.498 Non-pressure chronic ulcer of skin of other sites with other specified severity I73.00 Raynaud's syndrome without gangrene I10 Essential (primary) hypertension Follow-up Appointments Return Appointment in 1 week. Bathing/ Shower/ Hygiene May shower; gently cleanse wound with antibacterial soap, rinse and pat dry prior to dressing wounds Anesthetic (Use 'Patient Medications' Section for Anesthetic Order Entry) Lidocaine applied to wound bed Wound Treatment Wound #1 - Hand - 4th Digit Wound Laterality: Left Cleanser: Soap and Water 1 x Per Day/30 Days Discharge Instructions: Gently cleanse wound with antibacterial soap, rinse and pat dry prior to dressing wounds Cleanser: Wound Cleanser 1 x Per Day/30 Days Discharge Instructions: Wash your hands with soap and water. Remove old dressing, discard into plastic bag and place into trash. Cleanse the wound with Wound Cleanser prior to applying a clean dressing using gauze sponges, not tissues or cotton balls. Do not scrub or use excessive force.  Pat dry using gauze sponges, not tissue or cotton balls. Prim Dressing: Xeroform 5x9-HBD (in/in) 1 x Per Day/30 Days ary Discharge Instructions: Apply Xeroform 5x9-HBD (in/in) as directed Secondary Dressing: Gauze 1 x Per Day/30 Days Discharge Instructions: As directed: dry, moistened with saline or moistened with Dakins Solution Secured With: Medipore T - 20M Medipore H Soft Cloth Surgical T ape ape, 2x2 (in/yd) 1 x Per Day/30 Days Electronic Signature(s) Signed: 02/26/2023 4:28:11 PM By: Yevonne Pax RN Signed: 02/26/2023 10:26:23 PM By: Allen Derry PA-C Entered By: Yevonne Pax on 02/26/2023 09:37:19 Problem List Details -------------------------------------------------------------------------------- Despina Hick ([id number]) [id number] Page 3 of 5 Patient Name: Date of Service: KEYARIA, LAWSON 02/26/2023 8:45 A M Medical Record Number: [id number] Patient Account Number: [id number] Date of Birth/Sex: Treating RN: January 20, 1946 (77 y.o. Freddy Finner Primary Care Provider: Marisue Ivan Other Clinician: Referring Provider: Treating Provider/Extender: Maxwell Caul Weeks in Treatment: 1 Active Problems ICD-10 Encounter Code Description Active Date MDM Diagnosis M34.89 Other systemic sclerosis 02/19/2023 No Yes L98.498 Non-pressure chronic ulcer of skin of other sites with other specified severity 02/19/2023 No Yes I73.00 Raynaud's syndrome without gangrene 02/19/2023 No Yes I10 Essential (primary) hypertension 02/19/2023 No Yes Inactive Problems Resolved Problems Electronic Signature(s) Signed: 02/26/2023 9:26:59 AM By: Allen Derry PA-C Entered By: Allen Derry on 02/26/2023 09:26:59 -------------------------------------------------------------------------------- Progress Note Details Patient Name: Date of Service: [name] 02/26/2023 8:45 A M Medical Record Number: [id number] Patient Account Number: [id number] Date of Birth/Sex:  Treating RN: [birth year] (77 y.o. Freddy Finner Primary Care Provider: Marisue Ivan Other Clinician: Referring Provider: Treating Provider/Extender: Maxwell Caul Weeks in Treatment: 1 Subjective Chief Complaint Information obtained from Patient Left 4th finger ulcer History of Present Illness (HPI) 02-19-2023 upon evaluation today patient presents today for initial evaluation here in our clinic concerning issues she has been having with wounds on her hands that come intermittently. With that being said currently she has a wound which actually began she says around February 13, 2023. Initially showed up as  a white bump that then became what she feels like was pus filled. 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 Nonetheless her primary care provider did put her on the standard treatment that he starts whenever this happens that is prednisone in a tapering Dosepak which she is now on the 2 pills a day for 3 days part of the regimen and then subsequently he also put her on doxycycline for 7 days total. With that being said there does appear to be some signs of potential release for infection and I do believe that this has been somewhat beneficial for her which is good news. I do not see any signs of active infection locally nor systemically at this time. The patient does have a history of scleroderma, Raynaud's syndrome, and hypertension. 02-27-2023 upon evaluation today patient appears to be doing well currently in regard to her finger ulcer. This actually looks to be much less inflamed compared to where we have been. Fortunately I do not see any signs of active infection locally nor systemically the wound looks better and smaller it also looks much less irritated compared to where it was I see no signs of active infection at this point all of which is  excellent news. AMEYAH, PEYER ([id number]) [id number] Page 4 of 5 Objective Constitutional Well-nourished and well-hydrated in no acute distress. Vitals Time Taken: 8:56 AM, Height: 63 in, Weight: 125 lbs, BMI: 22.1, Temperature: 98.3 F, Pulse: 80 bpm, Respiratory Rate: 18 breaths/min, Blood Pressure: 125/78 mmHg. Respiratory normal breathing without difficulty. Psychiatric this patient is able to make decisions and demonstrates good insight into disease process. Alert and Oriented x 3. pleasant and cooperative. General Notes: Upon inspection patient's wound bed actually showed signs of good granulation epithelization at this point. Fortunately I do not see any signs of active infection locally nor systemically which is great news and in general I do believe that the patient is making good progress here. Integumentary (Hair, Skin) Wound #1 status is Open. Original cause of wound was Gradually Appeared. The date acquired was: 02/13/2023. The wound has been in treatment 1 weeks. The wound is located on the Left Hand - 4th Digit. The wound measures 2.5cm length x 2cm width x 0.2cm depth; 3.927cm^2 area and 0.785cm^3 volume. There is Fat Layer (Subcutaneous Tissue) exposed. There is no tunneling or undermining noted. There is a medium amount of serosanguineous drainage noted. There is small (1-33%) granulation within the wound bed. There is a large (67-100%) amount of necrotic tissue within the wound bed including Eschar and Adherent Slough. Assessment Active Problems ICD-10 Other systemic sclerosis Non-pressure chronic ulcer of skin of other sites with other specified severity Raynaud's syndrome without gangrene Essential (primary) hypertension Plan Follow-up Appointments: Return Appointment in 1 week. Bathing/ Shower/ Hygiene: May shower; gently cleanse wound with antibacterial soap, rinse and pat dry prior to dressing wounds Anesthetic (Use 'Patient  Medications' Section for Anesthetic Order Entry): Lidocaine applied to wound bed WOUND #1: - Hand - 4th Digit Wound Laterality: Left Cleanser: Soap and Water 1 x Per Day/30 Days Discharge Instructions: Gently cleanse wound with antibacterial soap, rinse and pat dry prior to dressing wounds Cleanser: Wound Cleanser 1 x Per Day/30 Days Discharge Instructions: Wash your hands with soap and water. Remove old dressing, discard into plastic bag and place into trash. Cleanse the wound with Wound Cleanser  prior to applying a clean dressing using gauze sponges, not tissues or cotton balls. Do not scrub or use excessive force. Pat dry using gauze sponges, not tissue or cotton balls. Prim Dressing: Xeroform 5x9-HBD (in/in) 1 x Per Day/30 Days ary Discharge Instructions: Apply Xeroform 5x9-HBD (in/in) as directed Secondary Dressing: Gauze 1 x Per Day/30 Days Discharge Instructions: As directed: dry, moistened with saline or moistened with Dakins Solution Secured With: Medipore T - 55M Medipore H Soft Cloth Surgical T ape ape, 2x2 (in/yd) 1 x Per Day/30 Days 1. Based on what I am seeing I do believe that we will get have to continue with the wound care measures as before with regard to the dressings. I think that she has been doing very well with the Xeroform but he continue with that as such. 2. Also can recommend that we continue with the gauze to cover which I think has also been doing well. We will see patient back for reevaluation in 1 week here in the clinic. If anything worsens or changes patient will contact our office for additional recommendations. Electronic Signature(s) Signed: 02/27/2023 7:15:14 AM By: Allen Derry PA-C Entered By: Allen Derry on 02/27/2023 07:15:14 SuperBill Details -------------------------------------------------------------------------------- Despina Hick ([id number]) [id number] Page 5 of 5 Patient Name: Date of Service: SAYSHA, MENTA  02/26/2023 Medical Record Number: [id number] Patient Account Number: [id number] Date of Birth/Sex: Treating RN: May 12, 1946 (77 y.o. Freddy Finner Primary Care Provider: Marisue Ivan Other Clinician: Referring Provider: Treating Provider/Extender: Maxwell Caul Weeks in Treatment: 1 Diagnosis Coding ICD-10 Codes Code Description M34.89 Other systemic sclerosis L98.498 Non-pressure chronic ulcer of skin of other sites with other specified severity I73.00 Raynaud's syndrome without gangrene I10 Essential (primary) hypertension Facility Procedures CPT4 Code Description Modifier Quantity [id number] [phone] - WOUND CARE VISIT-LEV 2 EST PT 1 Electronic Signature(s) Signed: 02/26/2023 4:28:11 PM By: Yevonne Pax RN Signed: 02/26/2023 10:26:23 PM By: Allen Derry PA-C Entered By: Yevonne Pax on 02/26/2023 09:37:48

## 2023-02-26 NOTE — Progress Notes (Signed)
YOVANA, KRAHL ([id number]) [id number] Page 1 of 7 Visit Report for 02/26/2023 Arrival Information Details Patient Name: Date of Service: [name], [name] 02/26/2023 8:45 A M Medical Record Number: [id number] Patient Account Number: [id number] Date of Birth/Sex: Treating RN: 04/11/1946 (77 y.o. Freddy Finner Primary Care Zyion Doxtater: Marisue Ivan Other Clinician: Referring Kelani Robart: Treating Sherice Ijames/Extender: Rebecka Apley in Treatment: 1 Visit Information History Since Last Visit Added or deleted any medications: No Patient Arrived: Ambulatory Any new allergies or adverse reactions: No Arrival Time: 08:52 Had a fall or experienced change in No Accompanied By: daughter activities of daily living that may affect Transfer Assistance: None risk of falls: Patient Identification Verified: Yes Signs or symptoms of abuse/neglect since last visito No Secondary Verification Process Completed: Yes Hospitalized since last visit: No Patient Requires Transmission-Based Precautions: No Implantable device outside of the clinic excluding No Patient Has Alerts: Yes cellular tissue based products placed in the center Patient Alerts: PACEMAKER since last visit: Has Dressing in Place as Prescribed: Yes Pain Present Now: No Electronic Signature(s) Signed: 02/26/2023 4:28:11 PM By: Yevonne Pax RN Entered By: Yevonne Pax on 02/26/2023 08:56:02 -------------------------------------------------------------------------------- Clinic Level of Care Assessment Details Patient Name: Date of Service: [name], [name] 02/26/2023 8:45 A M Medical Record Number: [id number] Patient Account Number: [id number] Date of Birth/Sex: Treating RN: 11/25/1945 (77 y.o. Freddy Finner Primary Care Mckenze Slone: Marisue Ivan Other Clinician: Referring Adithya Difrancesco: Treating Kensly Bowmer/Extender: Rebecka Apley in Treatment: 1 Clinic Level of Care  Assessment Items TOOL 4 Quantity Score X- 1 0 Use when only an EandM is performed on FOLLOW-UP visit ASSESSMENTS - Nursing Assessment / Reassessment X- 1 10 Reassessment of Co-morbidities (includes updates in patient status) X- 1 5 Reassessment of Adherence to Treatment Plan ASSESSMENTS - Wound and Skin A ssessment / Reassessment X - Simple Wound Assessment / Reassessment - one wound 1 5 []  - 0 Complex Wound Assessment / Reassessment - multiple wounds []  - 0 Dermatologic / Skin Assessment (not related to wound area) ASSESSMENTS - Focused Assessment []  - 0 Circumferential Edema Measurements - multi extremities []  - 0 Nutritional Assessment / Counseling / Intervention []  - 0 Lower Extremity Assessment (monofilament, tuning fork, pulses) []  - 0 Peripheral Arterial Disease Assessment (using hand held doppler) ASSESSMENTS - Ostomy and/or Continence Assessment and Care []  - 0 Incontinence Assessment and Management []  - 0 Ostomy Care Assessment and Management (repouching, etc.) PROCESS - Coordination of Care X - Simple Patient / Family Education for ongoing care 1 [address] [place] F ([id number]) [id number] Page 2 of 7 []  - 0 Complex (extensive) Patient / Family Education for ongoing care []  - 0 Staff obtains Chiropractor, Records, T Results / Process Orders est []  - 0 Staff telephones HHA, Nursing Homes / Clarify orders / etc []  - 0 Routine Transfer to another Facility (non-emergent condition) []  - 0 Routine Hospital Admission (non-emergent condition) []  - 0 New Admissions / Manufacturing engineer / Ordering NPWT Apligraf, etc. , []  - 0 Emergency Hospital Admission (emergent condition) X- 1 10 Simple Discharge Coordination []  - 0 Complex (extensive) Discharge Coordination PROCESS - Special Needs []  - 0 Pediatric / Minor Patient Management []  - 0 Isolation Patient Management []  - 0 Hearing / Language / Visual special needs []  - 0 Assessment of  Community assistance (transportation, D/C planning, etc.) []  - 0 Additional assistance / Altered mentation []  - 0 Support Surface(s) Assessment (bed, cushion, seat, etc.) INTERVENTIONS - Wound Cleansing / Measurement X -  Simple Wound Cleansing - one wound 1 5 []  - 0 Complex Wound Cleansing - multiple wounds X- 1 5 Wound Imaging (photographs - any number of wounds) []  - 0 Wound Tracing (instead of photographs) X- 1 5 Simple Wound Measurement - one wound []  - 0 Complex Wound Measurement - multiple wounds INTERVENTIONS - Wound Dressings X - Small Wound Dressing one or multiple wounds 1 10 []  - 0 Medium Wound Dressing one or multiple wounds []  - 0 Large Wound Dressing one or multiple wounds []  - 0 Application of Medications - topical []  - 0 Application of Medications - injection INTERVENTIONS - Miscellaneous []  - 0 External ear exam []  - 0 Specimen Collection (cultures, biopsies, blood, body fluids, etc.) []  - 0 Specimen(s) / Culture(s) sent or taken to Lab for analysis []  - 0 Patient Transfer (multiple staff / Nurse, adult / Similar devices) []  - 0 Simple Staple / Suture removal (25 or less) []  - 0 Complex Staple / Suture removal (26 or more) []  - 0 Hypo / Hyperglycemic Management (close monitor of Blood Glucose) []  - 0 Ankle / Brachial Index (ABI) - do not check if billed separately X- 1 5 Vital Signs Has the patient been seen at the hospital within the last three years: Yes Total Score: 75 Level Of Care: New/Established - Level 2 Electronic Signature(s) Signed: 02/26/2023 4:28:11 PM By: Yevonne Pax RN Entered By: Yevonne Pax on 02/26/2023 09:37:42 [name] ([id number]) [id number] Page 3 of 7 -------------------------------------------------------------------------------- Encounter Discharge Information Details Patient Name: Date of Service: [name], [name] 02/26/2023 8:45 A M Medical Record Number: [id number] Patient Account Number:  [id number] Date of Birth/Sex: Treating RN: 11/13/1946 (77 y.o. Freddy Finner Primary Care Jahi Roza: Marisue Ivan Other Clinician: Referring Grant Henkes: Treating Pascha Fogal/Extender: Rebecka Apley in Treatment: 1 Encounter Discharge Information Items Discharge Condition: Stable Ambulatory Status: Ambulatory Discharge Destination: Home Transportation: Private Auto Accompanied By: daughter Schedule Follow-up Appointment: Yes Clinical Summary of Care: Electronic Signature(s) Signed: 02/26/2023 4:28:11 PM By: Yevonne Pax RN Entered By: Yevonne Pax on 02/26/2023 09:38:37 -------------------------------------------------------------------------------- Lower Extremity Assessment Details Patient Name: Date of Service: [name], [name] 02/26/2023 8:45 A M Medical Record Number: [id number] Patient Account Number: [id number] Date of Birth/Sex: Treating RN: 1946/08/06 (77 y.o. Freddy Finner Primary Care Justice Aguirre: Marisue Ivan Other Clinician: Referring Madsen Riddle: Treating Ashleynicole Mcclees/Extender: Maxwell Caul Weeks in Treatment: 1 Electronic Signature(s) Signed: 02/26/2023 4:28:11 PM By: Yevonne Pax RN Entered By: Yevonne Pax on 02/26/2023 09:06:01 -------------------------------------------------------------------------------- Multi Wound Chart Details Patient Name: Date of Service: [name], Tammy Hong F. 02/26/2023 8:45 A M Medical Record Number: [id number] Patient Account Number: [id number] Date of Birth/Sex: Treating RN: [birth year] (77 y.o. Freddy Finner Primary Care Ethon Wymer: Marisue Ivan Other Clinician: Referring Ling Flesch: Treating Rolena Knutson/Extender: Maxwell Caul Weeks in Treatment: 1 Vital Signs Height(in): 63 Pulse(bpm): 80 Weight(lbs): 125 Blood Pressure(mmHg): 125/78 Body Mass Index(BMI): 22.1 Temperature(F): 98.3 Respiratory Rate(breaths/min): 18 [1:Photos:] [N/A:N/A] [name], [name] ([id number]) [1:Left  Hand - 4th Digit Wound Location: Gradually Appeared Wounding Event: Atypical Primary Etiology: Hypertension, Raynauds, Comorbid History: Scleroderma 02/13/2023 Date Acquired: 1 Weeks of Treatment: Open Wound Status: No Wound Recurrence: Yes  Pending A mputation on Presentation: 2.5x2x0.2 Measurements L x W x D (cm) 3.927 A (cm) : rea 0.785 Volume (cm) : 50.00% % Reduction in A rea: 50.00% % Reduction in Volume: Full Thickness Without Exposed Classification: Support Structures Medium  Exudate A mount: Serosanguineous Exudate Type: red, brown Exudate Color: Small (1-33%)  Granulation Amount: Large (67-100%) Necrotic Amount: Eschar, Adherent Slough Necrotic Tissue: Fat Layer (Subcutaneous Tissue): Yes N/A Exposed Structures: Fascia: No  Tendon: No Muscle: No Joint: No Bone: No None Epithelialization:] [N/A:N/A N/A N/A N/A N/A N/A N/A N/A N/A N/A N/A N/A N/A N/A N/A N/A N/A N/A N/A N/A N/A N/A] Treatment Notes Electronic Signature(s) Signed: 02/26/2023 4:28:11 PM By: Yevonne Pax RN Entered By: Yevonne Pax on 02/26/2023 09:06:08 -------------------------------------------------------------------------------- Multi-Disciplinary Care Plan Details Patient Name: Date of Service: Tammy Rushing F. 02/26/2023 8:45 A M Medical Record Number: [id number] Patient Account Number: [id number] Date of Birth/Sex: Treating RN: 1946-07-20 (78 y.o. Freddy Finner Primary Care Demyan Fugate: Marisue Ivan Other Clinician: Referring Aireona Torelli: Treating Khylon Davies/Extender: Rebecka Apley in Treatment: 1 Active Inactive Necrotic Tissue Nursing Diagnoses: Knowledge deficit related to management of necrotic/devitalized tissue Goals: Necrotic/devitalized tissue will be minimized in the wound bed Date Initiated: 02/19/2023 Target Resolution Date: 03/21/2023 Goal Status: Active Patient/caregiver will verbalize understanding of reason and process for debridement of necrotic tissue Date Initiated:  02/19/2023 Target Resolution Date: 03/21/2023 Goal Status: Active Interventions: Assess patient pain level pre-, during and post procedure and prior to discharge Treatment Activities: Apply topical anesthetic as ordered : 02/19/2023 Notes: Wound/Skin Impairment Nursing Diagnoses: [name], [name] ([id number]) [id number] Page 5 of 7 Knowledge deficit related to ulceration/compromised skin integrity Goals: Patient/caregiver will verbalize understanding of skin care regimen Date Initiated: 02/19/2023 Target Resolution Date: 03/21/2023 Goal Status: Active Ulcer/skin breakdown will have a volume reduction of 30% by week 4 Date Initiated: 02/19/2023 Target Resolution Date: 03/21/2023 Goal Status: Active Ulcer/skin breakdown will have a volume reduction of 50% by week 8 Date Initiated: 02/19/2023 Target Resolution Date: 04/21/2023 Goal Status: Active Ulcer/skin breakdown will have a volume reduction of 80% by week 12 Date Initiated: 02/19/2023 Target Resolution Date: 05/21/2023 Goal Status: Active Ulcer/skin breakdown will heal within 14 weeks Date Initiated: 02/19/2023 Target Resolution Date: 06/21/2023 Goal Status: Active Interventions: Assess patient/caregiver ability to obtain necessary supplies Assess patient/caregiver ability to perform ulcer/skin care regimen upon admission and as needed Assess ulceration(s) every visit Notes: Electronic Signature(s) Signed: 02/26/2023 4:28:11 PM By: Yevonne Pax RN Entered By: Yevonne Pax on 02/26/2023 09:06:29 -------------------------------------------------------------------------------- Pain Assessment Details Patient Name: Date of Service: [name] 02/26/2023 8:45 A M Medical Record Number: [id number] Patient Account Number: [id number] Date of Birth/Sex: Treating RN: [birth year] (77 y.o. Freddy Finner Primary Care Shelva Hetzer: Marisue Ivan Other Clinician: Referring Medrith Veillon: Treating Lynnelle Mesmer/Extender: Maxwell Caul Weeks in Treatment: 1 Active Problems Location of Pain Severity and Description of Pain Patient Has Paino No Site Locations Pain Management and Medication Current Pain Management: Electronic Signature(s) Signed: 02/26/2023 4:28:11 PM By: Yevonne Pax RN Entered By: Yevonne Pax on 02/26/2023 08:56:31 [name] ([id number]) [id number] Page 6 of 7 -------------------------------------------------------------------------------- Patient/Caregiver Education Details Patient Name: Date of Service: [name], [name] 4/11/2024andnbsp8:45 A M Medical Record Number: [id number] Patient Account Number: [id number] Date of Birth/Gender: Treating RN: 30-Jun-1946 (77 y.o. Freddy Finner Primary Care Physician: Marisue Ivan Other Clinician: Referring Physician: Treating Physician/Extender: Rebecka Apley in Treatment: 1 Education Assessment Education Provided To: Patient Education Topics Provided Wound/Skin Impairment: Methods: Explain/Verbal Responses: State content correctly Electronic Signature(s) Signed: 02/26/2023 4:28:11 PM By: Yevonne Pax RN Entered By: Yevonne Pax on 02/26/2023 09:06:44 -------------------------------------------------------------------------------- Wound Assessment Details Patient Name: Date of Service: [name] 02/26/2023 8:45 A M Medical Record Number: [id number] Patient Account Number: [id number] Date of Birth/Sex: Treating RN: 09-Sep-1946 (  77 y.o. Freddy FinnerF) [name], [name] Primary Care Jadian Karman: Marisue IvanLinthavong, Kanhka Other Clinician: Referring Jaysin Gayler: Treating Shavell Nored/Extender: Maxwell CaulStone, Hoyt Linthavong, Kanhka Weeks in Treatment: 1 Wound Status Wound Number: 1 Primary Etiology: Atypical Wound Location: Left Hand - 4th Digit Wound Status: Open Wounding Event: Gradually Appeared Comorbid History: Hypertension, Raynauds, Scleroderma Date Acquired: 02/13/2023 Weeks Of Treatment:  1 Clustered Wound: No Pending Amputation On Presentation Photos Wound Measurements Length: (cm) 2.5 Width: (cm) 2 Depth: (cm) 0.2 Area: (cm) 3.927 Volume: (cm) 0.785 % Reduction in Area: 50% % Reduction in Volume: 50% Epithelialization: None Tunneling: No Undermining: No Wound Description Classification: Full Thickness Without Exposed Support Structures Exudate Amount: Medium Tammy HickBAKER, Tammy F ([id number]) Exudate Type: Serosanguineous Exudate Color: red, brown Foul Odor After Cleansing: No Slough/Fibrino Yes [id number] Page 7 of 7 Wound Bed Granulation Amount: Small (1-33%) Exposed Structure Necrotic Amount: Large (67-100%) Fascia Exposed: No Necrotic Quality: Eschar, Adherent Slough Fat Layer (Subcutaneous Tissue) Exposed: Yes Tendon Exposed: No Muscle Exposed: No Joint Exposed: No Bone Exposed: No Treatment Notes Wound #1 (Hand - 4th Digit) Wound Laterality: Left Cleanser Soap and Water Discharge Instruction: Gently cleanse wound with antibacterial soap, rinse and pat dry prior to dressing wounds Wound Cleanser Discharge Instruction: Wash your hands with soap and water. Remove old dressing, discard into plastic bag and place into trash. Cleanse the wound with Wound Cleanser prior to applying a clean dressing using gauze sponges, not tissues or cotton balls. Do not scrub or use excessive force. Pat dry using gauze sponges, not tissue or cotton balls. Peri-Wound Care Topical Primary Dressing Xeroform 5x9-HBD (in/in) Discharge Instruction: Apply Xeroform 5x9-HBD (in/in) as directed Secondary Dressing Gauze Discharge Instruction: As directed: dry, moistened with saline or moistened with Dakins Solution Secured With Medipore T - 41M Medipore H Soft Cloth Surgical T ape ape, 2x2 (in/yd) Compression Wrap Compression Stockings Add-Ons Electronic Signature(s) Signed: 02/26/2023 4:28:11 PM By: Yevonne PaxEpps, Carrie RN Entered By: Yevonne PaxEpps, [name] on  02/26/2023 09:04:12 -------------------------------------------------------------------------------- Vitals Details Patient Name: Date of Service: Tammy QuillBA KER, Tammy HongJUDY F. 02/26/2023 8:45 A M Medical Record Number: [id number] Patient Account Number: [id number] Date of Birth/Sex: Treating RN: 1946/07/27 66(77 y.o. Freddy FinnerF) [name], [name] Primary Care Guadalupe Kerekes: Marisue IvanLinthavong, Kanhka Other Clinician: Referring Valleri Hendricksen: Treating Seretha Estabrooks/Extender: Rebecka ApleyStone, Hoyt Linthavong, Kanhka Weeks in Treatment: 1 Vital Signs Time Taken: 08:56 Temperature (F): 98.3 Height (in): 63 Pulse (bpm): 80 Weight (lbs): 125 Respiratory Rate (breaths/min): 18 Body Mass Index (BMI): 22.1 Blood Pressure (mmHg): 125/78 Reference Range: 80 - 120 mg / dl Electronic Signature(s) Signed: 02/26/2023 4:28:11 PM By: Yevonne PaxEpps, Carrie RN Entered By: Yevonne PaxEpps, [name] on 02/26/2023 78:[id number]:5608:56:24

## 2023-02-27 ENCOUNTER — Ambulatory Visit: Payer: Medicare HMO | Admitting: Physician Assistant

## 2023-03-05 ENCOUNTER — Ambulatory Visit: Payer: Medicare HMO | Admitting: Physician Assistant

## 2023-03-05 ENCOUNTER — Encounter: Payer: Medicare HMO | Admitting: Physician Assistant

## 2023-03-05 DIAGNOSIS — L98492 Non-pressure chronic ulcer of skin of other sites with fat layer exposed: Secondary | ICD-10-CM | POA: Diagnosis not present

## 2023-03-05 DIAGNOSIS — I7300 Raynaud's syndrome without gangrene: Secondary | ICD-10-CM | POA: Diagnosis not present

## 2023-03-05 DIAGNOSIS — L98498 Non-pressure chronic ulcer of skin of other sites with other specified severity: Secondary | ICD-10-CM | POA: Diagnosis not present

## 2023-03-05 DIAGNOSIS — M3489 Other systemic sclerosis: Secondary | ICD-10-CM | POA: Diagnosis not present

## 2023-03-05 DIAGNOSIS — I10 Essential (primary) hypertension: Secondary | ICD-10-CM | POA: Diagnosis not present

## 2023-03-05 NOTE — Progress Notes (Addendum)
CANDELARIA, PIES ([id number]) [id number] Page 1 of 6 Visit Report for 03/05/2023 Chief Complaint Document Details Patient Name: Date of Service: [name], [name] 03/05/2023 12:30 PM Medical Record Number: [id number] Patient Account Number: [id number] Date of Birth/Sex: Treating RN: 05-12-1946 (77 y.o. Freddy Finner Primary Care Provider: Marisue Ivan Other Clinician: Referring Provider: Treating Provider/Extender: Rebecka Apley in Treatment: 2 Information Obtained from: Patient Chief Complaint Left 4th finger ulcer Electronic Signature(s) Signed: 03/05/2023 1:04:46 PM By: Allen Derry PA-C Entered By: Allen Derry on 03/05/2023 13:04:46 -------------------------------------------------------------------------------- Debridement Details Patient Name: Date of Service: Tammy Rushing F. 03/05/2023 12:30 PM Medical Record Number: [id number] Patient Account Number: [id number] Date of Birth/Sex: Treating RN: 1945-12-08 (77 y.o. Freddy Finner Primary Care Provider: Marisue Ivan Other Clinician: Referring Provider: Treating Provider/Extender: Maxwell Caul Weeks in Treatment: 2 Debridement Performed for Assessment: Wound #1 Left Hand - 4th Digit Performed By: Physician Allen Derry, PA-C Debridement Type: Debridement Level of Consciousness (Pre-procedure): Awake and Alert Pre-procedure Verification/Time Out Yes - 13:10 Taken: Start Time: 13:10 T Area Debrided (L x W): otal 2.5 (cm) x 2 (cm) = 5 (cm) Tissue and other material debrided: Viable, Non-Viable, Slough, Subcutaneous, Biofilm, Slough, Other: calcium deposit Level: Skin/Subcutaneous Tissue Debridement Description: Excisional Instrument: Curette Bleeding: Minimum Hemostasis Achieved: Pressure End Time: 13:13 Procedural Pain: 0 Post Procedural Pain: 0 Response to Treatment: Procedure was tolerated well Level of Consciousness (Post- Awake and  Alert procedure): Post Debridement Measurements of Total Wound Length: (cm) 2.5 Width: (cm) 2 Depth: (cm) 0.2 Volume: (cm) 0.785 Character of Wound/Ulcer Post Debridement: Improved Post Procedure Diagnosis Same as Pre-procedure Electronic Signature(s) Signed: 03/05/2023 4:49:33 PM By: Yevonne Pax RN Signed: 03/06/2023 1:44:50 PM By: Allen Derry PA-C Entered By: Yevonne Pax on 03/05/2023 13:12:59 [name] ([id number]) [id number] Page 2 of 6 -------------------------------------------------------------------------------- HPI Details Patient Name: Date of Service: [name], [name] 03/05/2023 12:30 PM Medical Record Number: [id number] Patient Account Number: [id number] Date of Birth/Sex: Treating RN: August 27, 1946 (77 y.o. Freddy Finner Primary Care Provider: Marisue Ivan Other Clinician: Referring Provider: Treating Provider/Extender: Rebecka Apley in Treatment: 2 History of Present Illness HPI Description: 02-19-2023 upon evaluation today patient presents today for initial evaluation here in our clinic concerning issues she has been having with wounds on her hands that come intermittently. With that being said currently she has a wound which actually began she says around February 13, 2023. Initially showed up as a white bump that then became what she feels like was pus filled. 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 Nonetheless her primary care provider did put her on the standard treatment that he starts whenever this happens that is prednisone in a tapering Dosepak which she is now on the 2 pills a day for 3 days part of the regimen and then subsequently he also put her on doxycycline for 7 days total. With that being said there does appear to be some signs of potential release for infection and I do  believe that this has been somewhat beneficial for her which is good news. I do not see any signs of active infection locally nor systemically at this time. The patient does have a history of scleroderma, Raynaud's syndrome, and hypertension. 02-27-2023 upon evaluation today patient appears to be doing well currently in regard to her  finger ulcer. This actually looks to be much less inflamed compared to where we have been. Fortunately I do not see any signs of active infection locally nor systemically the wound looks better and smaller it also looks much less irritated compared to where it was I see no signs of active infection at this point all of which is excellent news. 03-05-2023 upon evaluation today patient appears to be doing well currently in regard to her wound. This is actually showing signs of improvement which is great news and very pleased in that regard. Fortunately there does not appear to be any signs of infection locally nor systemically which is great news. Electronic Signature(s) Signed: 03/05/2023 1:14:41 PM By: Allen Derry PA-C Entered By: Allen Derry on 03/05/2023 13:14:41 -------------------------------------------------------------------------------- Physical Exam Details Patient Name: Date of Service: [name], [name] 03/05/2023 12:30 PM Medical Record Number: [id number] Patient Account Number: [id number] Date of Birth/Sex: Treating RN: 1946/04/26 (77 y.o. Freddy Finner Primary Care Provider: Marisue Ivan Other Clinician: Referring Provider: Treating Provider/Extender: Maxwell Caul Weeks in Treatment: 2 Constitutional Well-nourished and well-hydrated in no acute distress. Respiratory normal breathing without difficulty. Psychiatric this patient is able to make decisions and demonstrates good insight into disease process. Alert and Oriented x 3. pleasant and cooperative. Notes Patient's wound bed did require sharp debridement clearway some of  the necrotic debris and slough she tolerated that today without complication postdebridement this is significantly improved. Electronic Signature(s) Signed: 03/05/2023 1:15:00 PM By: Allen Derry PA-C Entered By: Allen Derry on 03/05/2023 13:14:59 -------------------------------------------------------------------------------- Physician Orders Details Patient Name: Date of Service: [name], Tammy Hong F. 03/05/2023 12:30 PM Medical Record Number: [id number] Patient Account Number: [id number] Date of Birth/Sex: Treating RN: [birth year] (77 y.o. Freddy Finner Primary Care Provider: Marisue Ivan Other Clinician: Referring Provider: Treating Provider/Extender: Rebecka Apley in Treatment: 2 MIRAGE, PFEFFERKORN F ([id number]) [id number] Page 3 of 6 Verbal / Phone Orders: No Diagnosis Coding ICD-10 Coding Code Description M34.89 Other systemic sclerosis L98.498 Non-pressure chronic ulcer of skin of other sites with other specified severity I73.00 Raynaud's syndrome without gangrene I10 Essential (primary) hypertension Follow-up Appointments Return Appointment in 1 week. Bathing/ Shower/ Hygiene May shower; gently cleanse wound with antibacterial soap, rinse and pat dry prior to dressing wounds Anesthetic (Use 'Patient Medications' Section for Anesthetic Order Entry) Lidocaine applied to wound bed Wound Treatment Wound #1 - Hand - 4th Digit Wound Laterality: Left Cleanser: Soap and Water 1 x Per Day/30 Days Discharge Instructions: Gently cleanse wound with antibacterial soap, rinse and pat dry prior to dressing wounds Cleanser: Wound Cleanser 1 x Per Day/30 Days Discharge Instructions: Wash your hands with soap and water. Remove old dressing, discard into plastic bag and place into trash. Cleanse the wound with Wound Cleanser prior to applying a clean dressing using gauze sponges, not tissues or cotton balls. Do not scrub or use excessive force.  Pat dry using gauze sponges, not tissue or cotton balls. Prim Dressing: Xeroform 5x9-HBD (in/in) 1 x Per Day/30 Days ary Discharge Instructions: Apply Xeroform 5x9-HBD (in/in) as directed Secondary Dressing: Gauze 1 x Per Day/30 Days Discharge Instructions: As directed: dry, moistened with saline or moistened with Dakins Solution Secured With: Medipore T - 2M Medipore H Soft Cloth Surgical T ape ape, 2x2 (in/yd) 1 x Per Day/30 Days Electronic Signature(s) Signed: 03/05/2023 4:49:33 PM By: Yevonne Pax RN Signed: 03/06/2023 1:44:50 PM By: Allen Derry PA-C Entered By: Yevonne Pax on 03/05/2023 13:11:21 -------------------------------------------------------------------------------- Problem List Details Patient Name:  Date of Service: [name], [name] 03/05/2023 12:30 PM Medical Record Number: [id number] Patient Account Number: [id number] Date of Birth/Sex: Treating RN: February 27, 1946 (77 y.o. Freddy Finner Primary Care Provider: Marisue Ivan Other Clinician: Referring Provider: Treating Provider/Extender: Maxwell Caul Weeks in Treatment: 2 Active Problems ICD-10 Encounter Code Description Active Date MDM Diagnosis M34.89 Other systemic sclerosis 02/19/2023 No Yes L98.498 Non-pressure chronic ulcer of skin of other sites with other specified severity 02/19/2023 No Yes I73.00 Raynaud's syndrome without gangrene 02/19/2023 No Yes DANDRIA, GRIEGO ([id number]) [id number] Page 4 of 6 I10 Essential (primary) hypertension 02/19/2023 No Yes Inactive Problems Resolved Problems Electronic Signature(s) Signed: 03/05/2023 1:04:41 PM By: Allen Derry PA-C Entered By: Allen Derry on 03/05/2023 13:04:41 -------------------------------------------------------------------------------- Progress Note Details Patient Name: Date of Service: Tammy Rushing F. 03/05/2023 12:30 PM Medical Record Number: [id number] Patient Account Number: [id number] Date of Birth/Sex:  Treating RN: 1946-04-22 (77 y.o. Freddy Finner Primary Care Provider: Marisue Ivan Other Clinician: Referring Provider: Treating Provider/Extender: Maxwell Caul Weeks in Treatment: 2 Subjective Chief Complaint Information obtained from Patient Left 4th finger ulcer History of Present Illness (HPI) 02-19-2023 upon evaluation today patient presents today for initial evaluation here in our clinic concerning issues she has been having with wounds on her hands that come intermittently. With that being said currently she has a wound which actually began she says around February 13, 2023. Initially showed up as a white bump that then became what she feels like was pus filled. 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 Nonetheless her primary care provider did put her on the standard treatment that he starts whenever this happens that is prednisone in a tapering Dosepak which she is now on the 2 pills a day for 3 days part of the regimen and then subsequently he also put her on doxycycline for 7 days total. With that being said there does appear to be some signs of potential release for infection and I do believe that this has been somewhat beneficial for her which is good news. I do not see any signs of active infection locally nor systemically at this time. The patient does have a history of scleroderma, Raynaud's syndrome, and hypertension. 02-27-2023 upon evaluation today patient appears to be doing well currently in regard to her finger ulcer. This actually looks to be much less inflamed compared to where we have been. Fortunately I do not see any signs of active infection locally nor systemically the wound looks better and smaller it also looks much less irritated compared to where it was I see no signs of active infection at this point all of which is  excellent news. 03-05-2023 upon evaluation today patient appears to be doing well currently in regard to her wound. This is actually showing signs of improvement which is great news and very pleased in that regard. Fortunately there does not appear to be any signs of infection locally nor systemically which is great news. Objective Constitutional Well-nourished and well-hydrated in no acute distress. Vitals Time Taken: 12:42 PM, Height: 63 in, Weight: 125 lbs, BMI: 22.1, Temperature: 97.8 F, Pulse: 69 bpm, Respiratory Rate: 16 breaths/min, Blood Pressure: 117/54 mmHg. Respiratory normal breathing without difficulty. Psychiatric this patient is able to make decisions and demonstrates good insight into disease process. Alert and Oriented x 3. pleasant and cooperative. General  Notes: Patient's wound bed did require sharp debridement clearway some of the necrotic debris and slough she tolerated that today without complication postdebridement this is significantly improved. Integumentary (Hair, Skin) Wound #1 status is Open. Original cause of wound was Gradually Appeared. The date acquired was: 02/13/2023. The wound has been in treatment 2 weeks. The wound is located on the Left Hand - 4th Digit. The wound measures 2.5cm length x 2cm width x 0.2cm depth; 3.927cm^2 area and 0.785cm^3 volume. There is Fat Layer (Subcutaneous Tissue) exposed. There is no tunneling or undermining noted. There is a medium amount of serosanguineous drainage noted. There is small (1-33%) granulation within the wound bed. There is a large (67-100%) amount of necrotic tissue within the wound bed including Adherent Slough. [name], [name] ([id number]) [id number] Page 5 of 6 Assessment Active Problems ICD-10 Other systemic sclerosis Non-pressure chronic ulcer of skin of other sites with other specified severity Raynaud's syndrome without gangrene Essential (primary) hypertension Procedures Wound  #1 Pre-procedure diagnosis of Wound #1 is an Atypical located on the Left Hand - 4th Digit . There was a Excisional Skin/Subcutaneous Tissue Debridement with a total area of 5 sq cm performed by Allen Derry, PA-C. With the following instrument(s): Curette to remove Viable and Non-Viable tissue/material. Material removed includes Subcutaneous Tissue, Slough, Biofilm, and Other: calcium deposit. No specimens were taken. A time out was conducted at 13:10, prior to the start of the procedure. A Minimum amount of bleeding was controlled with Pressure. The procedure was tolerated well with a pain level of 0 throughout and a pain level of 0 following the procedure. Post Debridement Measurements: 2.5cm length x 2cm width x 0.2cm depth; 0.785cm^3 volume. Character of Wound/Ulcer Post Debridement is improved. Post procedure Diagnosis Wound #1: Same as Pre-Procedure Plan Follow-up Appointments: Return Appointment in 1 week. Bathing/ Shower/ Hygiene: May shower; gently cleanse wound with antibacterial soap, rinse and pat dry prior to dressing wounds Anesthetic (Use 'Patient Medications' Section for Anesthetic Order Entry): Lidocaine applied to wound bed WOUND #1: - Hand - 4th Digit Wound Laterality: Left Cleanser: Soap and Water 1 x Per Day/30 Days Discharge Instructions: Gently cleanse wound with antibacterial soap, rinse and pat dry prior to dressing wounds Cleanser: Wound Cleanser 1 x Per Day/30 Days Discharge Instructions: Wash your hands with soap and water. Remove old dressing, discard into plastic bag and place into trash. Cleanse the wound with Wound Cleanser prior to applying a clean dressing using gauze sponges, not tissues or cotton balls. Do not scrub or use excessive force. Pat dry using gauze sponges, not tissue or cotton balls. Prim Dressing: Xeroform 5x9-HBD (in/in) 1 x Per Day/30 Days ary Discharge Instructions: Apply Xeroform 5x9-HBD (in/in) as directed Secondary Dressing: Gauze 1 x  Per Day/30 Days Discharge Instructions: As directed: dry, moistened with saline or moistened with Dakins Solution Secured With: Medipore T - 35M Medipore H Soft Cloth Surgical T ape ape, 2x2 (in/yd) 1 x Per Day/30 Days 1. I would recommend that we have the patient continue to monitor for any evidence of infection or worsening. Obviously if anything changes she knows that she can contact the office and let me know. Otherwise we can see where things stand at follow-up in 1 week's time. 2. Going to continue with the Xeroform gauze followed by the gauze over top secured with tape. We will see patient back for reevaluation in 1 week here in the clinic. If anything worsens or changes patient will contact our office for additional recommendations. Electronic  Signature(s) Signed: 03/05/2023 1:15:18 PM By: Allen Derry PA-C Entered By: Allen Derry on 03/05/2023 13:15:18 -------------------------------------------------------------------------------- SuperBill Details Patient Name: Date of Service: [name], Tammy Hong F. 03/05/2023 Medical Record Number: [id number] Patient Account Number: [id number] Date of Birth/Sex: Treating RN: 1945/11/29 (77 y.o. Freddy Finner Primary Care Provider: Marisue Ivan Other Clinician: Referring Provider: Treating Provider/Extender: Rebecka Apley in Treatment: 2 Diagnosis [address] 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

## 2023-03-06 NOTE — Progress Notes (Signed)
[name] ([id number]) [id number] Page 1 of 7 Visit Report for 03/05/2023 Arrival Information Details Patient Name: Date of Service: [name], [name] 03/05/2023 12:30 PM Medical Record Number: [id number] Patient Account Number: [id number] Date of Birth/Sex: Treating RN: [birth year] (77 y.o. Freddy Finner Primary Care Khadejah Son: Marisue Ivan Other Clinician: Referring Stavros Cail: Treating Temara Lanum/Extender: Rebecka Apley in Treatment: 2 Visit Information History Since Last Visit Added or deleted any medications: No Patient Arrived: Ambulatory Any new allergies or adverse reactions: No Arrival Time: 12:41 Had a fall or experienced change in No Accompanied By: self activities of daily living that may affect Transfer Assistance: None risk of falls: Patient Identification Verified: Yes Signs or symptoms of abuse/neglect since last visito No Secondary Verification Process Completed: Yes Hospitalized since last visit: No Patient Requires Transmission-Based Precautions: No Implantable device outside of the clinic excluding No Patient Has Alerts: Yes cellular tissue based products placed in the center Patient Alerts: PACEMAKER since last visit: Has Dressing in Place as Prescribed: Yes Pain Present Now: No Electronic Signature(s) Signed: 03/05/2023 4:49:33 PM By: Yevonne Pax RN Entered By: Yevonne Pax on 03/05/2023 12:42:28 -------------------------------------------------------------------------------- Clinic Level of Care Assessment Details Patient Name: Date of Service: [name] 03/05/2023 12:30 PM Medical Record Number: [id number] Patient Account Number: [id number] Date of Birth/Sex: Treating RN: March 07, 1946 (77 y.o. Freddy Finner Primary Care Nyan Dufresne: Marisue Ivan Other Clinician: Referring Aydden Cumpian: Treating Sanoe Hazan/Extender: Rebecka Apley in Treatment: 2 Clinic Level of Care  Assessment Items TOOL 1 Quantity Score []  - 0 Use when EandM and Procedure is performed on INITIAL visit ASSESSMENTS - Nursing Assessment / Reassessment []  - 0 General Physical Exam (combine w/ comprehensive assessment (listed just below) when performed on new pt. evals) []  - 0 Comprehensive Assessment (HX, ROS, Risk Assessments, Wounds Hx, etc.) ASSESSMENTS - Wound and Skin Assessment / Reassessment []  - 0 Dermatologic / Skin Assessment (not related to wound area) ASSESSMENTS - Ostomy and/or Continence Assessment and Care []  - 0 Incontinence Assessment and Management []  - 0 Ostomy Care Assessment and Management (repouching, etc.) PROCESS - Coordination of Care []  - 0 Simple Patient / Family Education for ongoing care []  - 0 Complex (extensive) Patient / Family Education for ongoing care []  - 0 Staff obtains Chiropractor, Records, T Results / Process Orders est []  - 0 Staff telephones HHA, Nursing Homes / Clarify orders / etc []  - 0 Routine Transfer to another Facility (non-emergent condition) []  - 0 Routine Hospital Admission (non-emergent condition) []  - 0 New Admissions / Insurance Authorizations / Ordering NPWT Apligraf, etcCHARMINE, [name] ([id number]) [id number] Page 2 of 7 []  - 0 Emergency Hospital Admission (emergent condition) PROCESS - Special Needs []  - 0 Pediatric / Minor Patient Management []  - 0 Isolation Patient Management []  - 0 Hearing / Language / Visual special needs []  - 0 Assessment of Community assistance (transportation, D/C planning, etc.) []  - 0 Additional assistance / Altered mentation []  - 0 Support Surface(s) Assessment (bed, cushion, seat, etc.) INTERVENTIONS - Miscellaneous []  - 0 External ear exam []  - 0 Patient Transfer (multiple staff / Nurse, adult / Similar devices) []  - 0 Simple Staple / Suture removal (25 or less) []  - 0 Complex Staple / Suture removal (26 or more) []  - 0 Hypo/Hyperglycemic  Management (do not check if billed separately) []  - 0 Ankle / Brachial Index (ABI) - do not check if billed separately Has the patient been seen at the hospital within  the last three years: Yes Total Score: 0 Level Of Care: ____ Electronic Signature(s) Signed: 03/05/2023 4:49:33 PM By: Yevonne Pax RN Entered By: Yevonne Pax on 03/05/2023 15:10:16 -------------------------------------------------------------------------------- Encounter Discharge Information Details Patient Name: Date of Service: [name], [name]. 03/05/2023 12:30 PM Medical Record Number: [id number] Patient Account Number: [id number] Date of Birth/Sex: Treating RN: [birth year] (77 y.o. Freddy Finner Primary Care Kyl Givler: Marisue Ivan Other Clinician: Referring Sharion Grieves: Treating Akeisha Lagerquist/Extender: Rebecka Apley in Treatment: 2 Encounter Discharge Information Items Post Procedure Vitals Discharge Condition: Stable Temperature (Simmons): 97.8 Ambulatory Status: Ambulatory Pulse (bpm): 69 Discharge Destination: Home Respiratory Rate (breaths/min): 16 Transportation: Private Auto Blood Pressure (mmHg): 117/54 Accompanied By: self Schedule Follow-up Appointment: Yes Clinical Summary of Care: Electronic Signature(s) Signed: 03/05/2023 3:11:07 PM By: Yevonne Pax RN Entered By: Yevonne Pax on 03/05/2023 15:11:07 -------------------------------------------------------------------------------- Lower Extremity Assessment Details Patient Name: Date of Service: [name] 03/05/2023 12:30 PM Medical Record Number: [id number] Patient Account Number: [id number] Date of Birth/Sex: Treating RN: [birth year] (77 y.o. Freddy Finner Primary Care Tymarion Everard: Marisue Ivan Other Clinician: Referring Katriana Dortch: Treating Skip Litke/Extender: Maxwell Caul Weeks in Treatment: 2 Electronic Signature(s) Signed: 03/05/2023 4:49:33 PM By: Yevonne Pax RN Entered By: Yevonne Pax on  03/05/2023 12:46:23 [name] ([id number]) [id number] Page 3 of 7 -------------------------------------------------------------------------------- Multi Wound Chart Details Patient Name: Date of Service: [name], [name] 03/05/2023 12:30 PM Medical Record Number: [id number] Patient Account Number: [id number] Date of Birth/Sex: Treating RN: Feb 23, 1946 (77 y.o. Freddy Finner Primary Care Isami Mehra: Marisue Ivan Other Clinician: Referring Bret Vanessen: Treating Devario Bucklew/Extender: Rebecka Apley in Treatment: 2 Vital Signs Height(in): 63 Pulse(bpm): 69 Weight(lbs): 125 Blood Pressure(mmHg): 117/54 Body Mass Index(BMI): 22.1 Temperature(Simmons): 97.8 Respiratory Rate(breaths/min): 16 [1:Photos:] [N/A:N/A] Left Hand - 4th Digit N/A N/A Wound Location: Gradually Appeared N/A N/A Wounding Event: Atypical N/A N/A Primary Etiology: Hypertension, Raynauds, N/A N/A Comorbid History: Scleroderma 02/13/2023 N/A N/A Date Acquired: 2 N/A N/A Weeks of Treatment: Open N/A N/A Wound Status: No N/A N/A Wound Recurrence: Yes N/A N/A Pending A mputation on Presentation: 2.5x2x0.2 N/A N/A Measurements L x W x D (cm) 3.927 N/A N/A A (cm) : rea 0.785 N/A N/A Volume (cm) : 50.00% N/A N/A % Reduction in A rea: 50.00% N/A N/A % Reduction in Volume: Full Thickness Without Exposed N/A N/A Classification: Support Structures Medium N/A N/A Exudate Amount: Serosanguineous N/A N/A Exudate Type: red, brown N/A N/A Exudate Color: Small (1-33%) N/A N/A Granulation Amount: Large (67-100%) N/A N/A Necrotic Amount: Fat Layer (Subcutaneous Tissue): Yes N/A N/A Exposed Structures: Fascia: No Tendon: No Muscle: No Joint: No Bone: No None N/A N/A Epithelialization: Treatment Notes Electronic Signature(s) Signed: 03/05/2023 4:49:33 PM By: Yevonne Pax RN Entered By: Yevonne Pax on 03/05/2023  12:46:37 -------------------------------------------------------------------------------- Multi-Disciplinary Care Plan Details Patient Name: Date of Service: Tammy Rushing Simmons. 03/05/2023 12:30 PM Medical Record Number: [id number] Patient Account Number: [id number] Date of Birth/Sex: Treating RN: 1946-04-03 (77 y.o. Freddy Finner Primary Care Ailsa Mireles: Marisue Ivan Other Clinician: GLADY, [name] ([id number]) [id number] Page 4 of 7 Referring Louisa Favaro: Treating Moishe Schellenberg/Extender: Rebecka Apley in Treatment: 2 Active Inactive Necrotic Tissue Nursing Diagnoses: Knowledge deficit related to management of necrotic/devitalized tissue Goals: Necrotic/devitalized tissue will be minimized in the wound bed Date Initiated: 02/19/2023 Target Resolution Date: 03/21/2023 Goal Status: Active Patient/caregiver will verbalize understanding of reason and process for debridement of necrotic tissue Date Initiated: 02/19/2023 Target Resolution Date: 03/21/2023 Goal Status: Active Interventions:  Assess patient pain level pre-, during and post procedure and prior to discharge Treatment Activities: Apply topical anesthetic as ordered : 02/19/2023 Notes: Wound/Skin Impairment Nursing Diagnoses: Knowledge deficit related to ulceration/compromised skin integrity Goals: Patient/caregiver will verbalize understanding of skin care regimen Date Initiated: 02/19/2023 Target Resolution Date: 03/21/2023 Goal Status: Active Ulcer/skin breakdown will have a volume reduction of 30% by week 4 Date Initiated: 02/19/2023 Target Resolution Date: 03/21/2023 Goal Status: Active Ulcer/skin breakdown will have a volume reduction of 50% by week 8 Date Initiated: 02/19/2023 Target Resolution Date: 04/21/2023 Goal Status: Active Ulcer/skin breakdown will have a volume reduction of 80% by week 12 Date Initiated: 02/19/2023 Target Resolution Date: 05/21/2023 Goal Status: Active Ulcer/skin  breakdown will heal within 14 weeks Date Initiated: 02/19/2023 Target Resolution Date: 06/21/2023 Goal Status: Active Interventions: Assess patient/caregiver ability to obtain necessary supplies Assess patient/caregiver ability to perform ulcer/skin care regimen upon admission and as needed Assess ulceration(s) every visit Notes: Electronic Signature(s) Signed: 03/05/2023 4:49:33 PM By: Yevonne Pax RN Entered By: Yevonne Pax on 03/05/2023 12:46:51 -------------------------------------------------------------------------------- Pain Assessment Details Patient Name: Date of Service: [name], [name] 03/05/2023 12:30 PM Medical Record Number: [id number] Patient Account Number: [id number] Date of Birth/Sex: Treating RN: February 08, 1946 (77 y.o. Freddy Finner Primary Care Markiesha Delia: Marisue Ivan Other Clinician: Referring Kayley Zeiders: Treating Ahsan Esterline/Extender: Rebecka Apley in Treatment: [id number] HANIYA, FERN Simmons ([id number]) [id number] Page 5 of 7 Location of Pain Severity and Description of Pain Patient Has Paino No Site Locations Pain Management and Medication Current Pain Management: Electronic Signature(s) Signed: 03/05/2023 4:49:33 PM By: Yevonne Pax RN Entered By: Yevonne Pax on 03/05/2023 12:42:52 -------------------------------------------------------------------------------- Patient/Caregiver Education Details Patient Name: Date of Service: Sanjuana Kava 4/18/2024andnbsp12:30 PM Medical Record Number: [id number] Patient Account Number: [id number] Date of Birth/Gender: Treating RN: [birth year] (77 y.o. Freddy Finner Primary Care Physician: Marisue Ivan Other Clinician: Referring Physician: Treating Physician/Extender: Rebecka Apley in Treatment: 2 Education Assessment Education Provided To: Patient Education Topics Provided Wound Debridement: Handouts: Wound Debridement Methods:  Explain/Verbal Responses: State content correctly Electronic Signature(s) Signed: 03/05/2023 4:49:33 PM By: Yevonne Pax RN Entered By: Yevonne Pax on 03/05/2023 12:47:06 -------------------------------------------------------------------------------- Wound Assessment Details Patient Name: Date of Service: [name], [name] 03/05/2023 12:30 PM Medical Record Number: [id number] Patient Account Number: [id number] Date of Birth/Sex: Treating RN: [birth year] (77 y.o. Freddy Finner Primary Care Gregg Winchell: Marisue Ivan Other Clinician: Referring Cobe Viney: Treating Deaja Rizo/Extender: Maxwell Caul Weeks in Treatment: 2 Wound Status Wound Number: 1 Primary Etiology: Atypical [name], [name] ([id number]) [id number] Page 6 of 7 Wound Location: Left Hand - 4th Digit Wound Status: Open Wounding Event: Gradually Appeared Comorbid History: Hypertension, Raynauds, Scleroderma Date Acquired: 02/13/2023 Weeks Of Treatment: 2 Clustered Wound: No Pending Amputation On Presentation Photos Wound Measurements Length: (cm) 2.5 Width: (cm) 2 Depth: (cm) 0.2 Area: (cm) 3.927 Volume: (cm) 0.785 % Reduction in Area: 50% % Reduction in Volume: 50% Epithelialization: None Tunneling: No Undermining: No Wound Description Classification: Full Thickness Without Exposed Support Structures Exudate Amount: Medium Exudate Type: Serosanguineous Exudate Color: red, brown Foul Odor After Cleansing: No Slough/Fibrino Yes Wound Bed Granulation Amount: Small (1-33%) Exposed Structure Necrotic Amount: Large (67-100%) Fascia Exposed: No Necrotic Quality: Adherent Slough Fat Layer (Subcutaneous Tissue) Exposed: Yes Tendon Exposed: No Muscle Exposed: No Joint Exposed: No Bone Exposed: No Treatment Notes Wound #1 (Hand - 4th Digit) Wound Laterality: Left Cleanser Soap and Water Discharge Instruction: Gently cleanse wound with antibacterial soap, rinse and  pat  dry prior to dressing wounds Wound Cleanser Discharge Instruction: Wash your hands with soap and water. Remove old dressing, discard into plastic bag and place into trash. Cleanse the wound with Wound Cleanser prior to applying a clean dressing using gauze sponges, not tissues or cotton balls. Do not scrub or use excessive force. Pat dry using gauze sponges, not tissue or cotton balls. Peri-Wound Care Topical Primary Dressing Xeroform 5x9-HBD (in/in) Discharge Instruction: Apply Xeroform 5x9-HBD (in/in) as directed Secondary Dressing Gauze Discharge Instruction: As directed: dry, moistened with saline or moistened with Dakins Solution Secured With Medipore T - 53M Medipore H Soft Cloth Surgical T ape ape, 2x2 (in/yd) Compression Wrap Compression Stockings Add-Ons NAHDIA, [name] ([id number]) [id number] Page 7 of 7 Electronic Signature(s) Signed: 03/05/2023 4:49:33 PM By: Yevonne Pax RN Entered By: Yevonne Pax on 03/05/2023 12:46:00 -------------------------------------------------------------------------------- Vitals Details Patient Name: Date of Service: [name], [name]. 03/05/2023 12:30 PM Medical Record Number: [id number] Patient Account Number: [id number] Date of Birth/Sex: Treating RN: 1946-09-13 (77 y.o. Freddy Finner Primary Care Roan Sawchuk: Marisue Ivan Other Clinician: Referring Mao Lockner: Treating Trampas Stettner/Extender: Rebecka Apley in Treatment: 2 Vital Signs Time Taken: 12:42 Temperature (Simmons): 97.8 Height (in): 63 Pulse (bpm): 69 Weight (lbs): 125 Respiratory Rate (breaths/min): 16 Body Mass Index (BMI): 22.1 Blood Pressure (mmHg): 117/54 Reference Range: 80 - 120 mg / dl Electronic Signature(s) Signed: 03/05/2023 4:49:33 PM By: Yevonne Pax RN Entered By: Yevonne Pax on 03/05/2023 12:42:44

## 2023-03-10 ENCOUNTER — Ambulatory Visit: Payer: Medicare HMO | Admitting: Dermatology

## 2023-03-10 ENCOUNTER — Encounter: Payer: Self-pay | Admitting: Dermatology

## 2023-03-10 DIAGNOSIS — L820 Inflamed seborrheic keratosis: Secondary | ICD-10-CM | POA: Diagnosis not present

## 2023-03-10 DIAGNOSIS — Z872 Personal history of diseases of the skin and subcutaneous tissue: Secondary | ICD-10-CM

## 2023-03-10 DIAGNOSIS — C4491 Basal cell carcinoma of skin, unspecified: Secondary | ICD-10-CM

## 2023-03-10 DIAGNOSIS — C44319 Basal cell carcinoma of skin of other parts of face: Secondary | ICD-10-CM

## 2023-03-10 DIAGNOSIS — L578 Other skin changes due to chronic exposure to nonionizing radiation: Secondary | ICD-10-CM | POA: Diagnosis not present

## 2023-03-10 DIAGNOSIS — D492 Neoplasm of unspecified behavior of bone, soft tissue, and skin: Secondary | ICD-10-CM

## 2023-03-10 DIAGNOSIS — Z7189 Other specified counseling: Secondary | ICD-10-CM | POA: Diagnosis not present

## 2023-03-10 DIAGNOSIS — Z85828 Personal history of other malignant neoplasm of skin: Secondary | ICD-10-CM | POA: Diagnosis not present

## 2023-03-10 HISTORY — DX: Basal cell carcinoma of skin, unspecified: C44.91

## 2023-03-10 NOTE — Patient Instructions (Addendum)
If ok with PCP, recommend Niacinamide or Nicotinamide  twice per day to lower risk of non-melanoma skin cancer by approximately 25%. This is usually available at [employer].    Cryotherapy Aftercare  Wash gently with soap and water everyday.   Apply Vaseline and Band-Aid daily until healed.   Wound Care Instructions  Cleanse wound gently with soap and water once a day then pat dry with clean gauze. Apply a thin coat of Petrolatum (petroleum jelly, "Vaseline") over the wound (unless you have an allergy to this). We recommend that you use a new, sterile tube of Vaseline. Do not pick or remove scabs. Do not remove the yellow or white "healing tissue" from the base of the wound.  Cover the wound with fresh, clean, nonstick gauze and secure with paper tape. You may use Band-Aids in place of gauze and tape if the wound is small enough, but would recommend trimming much of the tape off as there is often too much. Sometimes Band-Aids can irritate the skin.  You should call the office for your biopsy report after 1 week if you have not already been contacted.  If you experience any problems, such as abnormal amounts of bleeding, swelling, significant bruising, significant pain, or evidence of infection, please call the office immediately.  FOR ADULT SURGERY PATIENTS: If you need something for pain relief you may take 1 extra strength Tylenol (acetaminophen) AND 2 Ibuprofen (  each) together every 4 hours as needed for pain. (do not take these if you are allergic to them or if you have a reason you should not take them.) Typically, you may only need pain medication for 1 to 3 days.

## 2023-03-10 NOTE — Progress Notes (Unsigned)
Follow-Up Visit   Subjective  [name] is a 77 y.o. female who presents for the following: AK follow up. Patient used 5FU to treat face. She does have a few hard spots at right thigh.   The following portions of the chart were reviewed this encounter and updated as appropriate: medications, allergies, medical history  Review of Systems:  No other skin or systemic complaints except as noted in HPI or Assessment and Plan.  Objective  Well appearing patient in no apparent distress; mood and affect are within normal limits.   A focused examination was performed of the following areas: Face, right thigh  Relevant exam findings are noted in the Assessment and Plan.  right medial cheek 0.3 cm pink and tan papule r/o BCC R medial cheek     Right Thigh x 2 (2) Erythematous stuck-on, waxy papule or plaque    Assessment & Plan   HISTORY OF PRECANCEROUS ACTINIC KERATOSIS - site(s) of PreCancerous Actinic Keratosis clear today. - these may recur and new lesions may form requiring treatment to prevent transformation into skin cancer - observe for new or changing spots and contact [facility] for appointment if occur - photoprotection with sun protective clothing; sunglasses and broad spectrum sunscreen with SPF of at least 30 + and frequent self skin exams recommended - yearly exams by a dermatologist recommended for persons with history of PreCancerous Actinic Keratoses   Neoplasm of skin right medial cheek  Skin / nail biopsy Type of biopsy: tangential   Informed consent: discussed and consent obtained   Timeout: patient name, date of birth, surgical site, and procedure verified   Procedure prep:  Patient was prepped and draped in usual sterile fashion Prep type:  Isopropyl alcohol Anesthesia: the lesion was anesthetized in a standard fashion   Anesthetic:  1% lidocaine w/ epinephrine 1-100,000 buffered w/ 8.4% NaHCO3 Instrument used: flexible razor blade    Hemostasis achieved with: aluminum chloride   Outcome: patient tolerated procedure well   Post-procedure details: wound care instructions given   Additional details:  Petrolatum and a pressure bandage applied  Specimen 1 - Surgical pathology Differential Diagnosis: r/o BCC  Check Margins: No 0.3 cm pink and tan papule  Inflamed seborrheic keratosis (2) Right Thigh x 2  > AK  Symptomatic, irritating, patient would like treated.  Prior to procedure, discussed risks of blister formation, small wound, skin dyspigmentation, or rare scar following cryotherapy. Recommend Vaseline ointment to treated areas while healing.   Destruction of lesion - Right Thigh x 2  Destruction method: cryotherapy   Informed consent: discussed and consent obtained   Lesion destroyed using liquid nitrogen: Yes   Cryotherapy cycles:  2 Outcome: patient tolerated procedure well with no complications   Post-procedure details: wound care instructions given    WART Exam: verrucous papule(s) at right thigh  Counseling Discussed viral / HPV (Human Papilloma Virus) etiology and risk of spread /infectivity to other areas of body as well as to other people.  Multiple treatments and methods may be required to clear warts and it is possible treatment may not be successful.  Treatment risks include discoloration; scarring and there is still potential for wart recurrence.  Treatment Plan: Destruction Procedure Note Destruction method: cryotherapy   Informed consent: discussed and consent obtained   Lesion destroyed using liquid nitrogen: Yes   Outcome: patient tolerated procedure well with no complications   Post-procedure details: wound care instructions given   Locations: right thigh # of Lesions  Treated: 1  Prior to procedure, discussed risks of blister formation, small wound, skin dyspigmentation, or rare scar following cryotherapy. Recommend Vaseline ointment to treated areas while healing.   HISTORY OF  BASAL CELL CARCINOMA OF THE SKIN - No evidence of recurrence today at right pretibial - Recommend regular full body skin exams - Recommend daily broad spectrum sunscreen SPF 30+ to sun-exposed areas, reapply every 2 hours as needed.  - Call if any new or changing lesions are noted between office visits   Return for 3-4 month FBSE.  Anise Salvo, RMA, am acting as scribe for Darden Dates, MD .   Documentation: I have reviewed the above documentation for accuracy and completeness, and I agree with the above.  Darden Dates, MD

## 2023-03-12 ENCOUNTER — Ambulatory Visit: Payer: Medicare HMO | Admitting: Dermatology

## 2023-03-12 ENCOUNTER — Encounter: Payer: Medicare HMO | Admitting: Physician Assistant

## 2023-03-12 DIAGNOSIS — L98492 Non-pressure chronic ulcer of skin of other sites with fat layer exposed: Secondary | ICD-10-CM | POA: Diagnosis not present

## 2023-03-12 DIAGNOSIS — L98498 Non-pressure chronic ulcer of skin of other sites with other specified severity: Secondary | ICD-10-CM | POA: Diagnosis not present

## 2023-03-12 DIAGNOSIS — I7300 Raynaud's syndrome without gangrene: Secondary | ICD-10-CM | POA: Diagnosis not present

## 2023-03-12 DIAGNOSIS — I10 Essential (primary) hypertension: Secondary | ICD-10-CM | POA: Diagnosis not present

## 2023-03-12 DIAGNOSIS — M3489 Other systemic sclerosis: Secondary | ICD-10-CM | POA: Diagnosis not present

## 2023-03-13 NOTE — Progress Notes (Addendum)
JUDYE, LORINO ([id number]) [id number] Page 1 of 5 Visit Report for 03/12/2023 Chief Complaint Document Details Patient Name: Date of Service: [name], [name] 03/12/2023 1:30 PM Medical Record Number: [id number] Patient Account Number: [id number] Date of Birth/Sex: Treating RN: Jun 15, 1946 (77 y.o. [name] Primary Care Provider: Marisue Simmons Other Clinician: Referring Provider: Treating Provider/Extender: Rebecka Apley in Treatment: 3 Information Obtained from: Patient Chief Complaint Left 4th finger ulcer Electronic Signature(s) Signed: 03/12/2023 1:24:32 PM By: Allen Derry PA-C Entered By: Allen Derry on 03/12/2023 13:24:32 -------------------------------------------------------------------------------- HPI Details Patient Name: Date of Service: [name], Tammy Hong F. 03/12/2023 1:30 PM Medical Record Number: [id number] Patient Account Number: [id number] Date of Birth/Sex: Treating RN: 18-Oct-1946 (77 y.o. [name] Primary Care Provider: Marisue Simmons Other Clinician: Referring Provider: Treating Provider/Extender: Rebecka Apley in Treatment: 3 History of Present Illness HPI Description: 02-19-2023 upon evaluation today patient presents today for initial evaluation here in our clinic concerning issues she has been having with wounds on her hands that come intermittently. With that being said currently she has a wound which actually began she says around February 13, 2023. Initially showed up as a white bump that then became what she feels like was pus filled. 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 Nonetheless her primary care provider did put her on the standard treatment that he starts whenever this happens that is prednisone in a tapering Dosepak which she is now on the 2  pills a day for 3 days part of the regimen and then subsequently he also put her on doxycycline for 7 days total. With that being said there does appear to be some signs of potential release for infection and I do believe that this has been somewhat beneficial for her which is good news. I do not see any signs of active infection locally nor systemically at this time. The patient does have a history of scleroderma, Raynaud's syndrome, and hypertension. 02-27-2023 upon evaluation today patient appears to be doing well currently in regard to her finger ulcer. This actually looks to be much less inflamed compared to where we have been. Fortunately I do not see any signs of active infection locally nor systemically the wound looks better and smaller it also looks much less irritated compared to where it was I see no signs of active infection at this point all of which is excellent news. 03-05-2023 upon evaluation today patient appears to be doing well currently in regard to her wound. This is actually showing signs of improvement which is great news and very pleased in that regard. Fortunately there does not appear to be any signs of infection locally nor systemically which is great news. 03-12-2023 upon evaluation today patient appears to be doing well currently in regard to her wound. She has been tolerating the dressing changes without complication. Fortunately there does not appear to be any signs of active infection locally nor systemically which is great news as well. No fevers, chills, nausea, vomiting, or diarrhea. Electronic Signature(s) Signed: 03/12/2023 2:01:34 PM By: Allen Derry PA-C Entered By: Allen Derry on 03/12/2023 14:01:34 -------------------------------------------------------------------------------- Physical Exam Details Patient Name: Date of Service: [name]. 03/12/2023 1:30 PM Medical Record Number: [id number] Patient Account Number: [id number] Date of Birth/Sex: Treating  RN: 09-Nov-1946 (77 y.o. [name] Primary Care Provider:  Marisue Simmons Other Clinician: Referring Provider: Treating Provider/Extender: Rebecka Apley in Treatment: [address] [name], [name] ([id number]) [id number] Page 2 of 5 Constitutional Well-nourished and well-hydrated in no acute distress. Respiratory normal breathing without difficulty. Psychiatric this patient is able to make decisions and demonstrates good insight into disease process. Alert and Oriented x 3. pleasant and cooperative. Notes Upon inspection patient's wound bed showed signs of good granulation epithelization she has been doing extremely well with the Xeroform gauze which I think is doing a great job here and overall I am extremely pleased with where we stand currently. No fevers, chills, nausea, vomiting, or diarrhea. Electronic Signature(s) Signed: 03/12/2023 2:02:04 PM By: Allen Derry PA-C Entered By: Allen Derry on 03/12/2023 14:02:03 -------------------------------------------------------------------------------- Physician Orders Details Patient Name: Date of Service: [name], Tammy Hong F. 03/12/2023 1:30 PM Medical Record Number: [id number] Patient Account Number: [id number] Date of Birth/Sex: Treating RN: 08-Jan-1946 (77 y.o. [name] Primary Care Provider: Marisue Simmons Other Clinician: Referring Provider: Treating Provider/Extender: Rebecka Apley in Treatment: 3 Verbal / Phone Orders: No Diagnosis Coding ICD-10 Coding Code Description M34.89 Other systemic sclerosis L98.498 Non-pressure chronic ulcer of skin of other sites with other specified severity I73.00 Raynaud's syndrome without gangrene I10 Essential (primary) hypertension Follow-up Appointments Return Appointment in 1 week. Bathing/ Shower/ Hygiene May shower; gently cleanse wound with antibacterial soap, rinse and pat dry prior to dressing wounds Anesthetic  (Use 'Patient Medications' Section for Anesthetic Order Entry) Lidocaine applied to wound bed Wound Treatment Wound #1 - Hand - 4th Digit Wound Laterality: Left Cleanser: Soap and Water 1 x Per Day/30 Days Discharge Instructions: Gently cleanse wound with antibacterial soap, rinse and pat dry prior to dressing wounds Cleanser: Wound Cleanser 1 x Per Day/30 Days Discharge Instructions: Wash your hands with soap and water. Remove old dressing, discard into plastic bag and place into trash. Cleanse the wound with Wound Cleanser prior to applying a clean dressing using gauze sponges, not tissues or cotton balls. Do not scrub or use excessive force. Pat dry using gauze sponges, not tissue or cotton balls. Prim Dressing: Xeroform 5x9-HBD (in/in) 1 x Per Day/30 Days ary Discharge Instructions: Apply Xeroform 5x9-HBD (in/in) as directed Secondary Dressing: Gauze 1 x Per Day/30 Days Secured With: Medipore T - 107M Medipore H Soft Cloth Surgical T ape ape, 2x2 (in/yd) 1 x Per Day/30 Days Electronic Signature(s) Signed: 03/12/2023 3:34:54 PM By: Tammy Pax RN Signed: 03/12/2023 5:47:58 PM By: Allen Derry PA-C Entered By: [name] on 03/12/2023 14:00:21 [name] ([id number]) [id number] Page 3 of 5 -------------------------------------------------------------------------------- Problem List Details Patient Name: Date of Service: [name], [name] 03/12/2023 1:30 PM Medical Record Number: [id number] Patient Account Number: [id number] Date of Birth/Sex: Treating RN: 1945-12-02 (77 y.o. 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 03/12/2023 1:30 PM Medical Record Number: [id number] Patient Account Number: [id number] Date of Birth/Sex: Treating RN: 1945-12-19 (77 y.o. F)  [name] Primary Care Provider: Marisue Simmons Other Clinician: Referring Provider: Treating Provider/Extender: [name] Weeks in Treatment: 3 Subjective Chief Complaint Information obtained from Patient Left 4th finger ulcer History of Present Illness (HPI) 02-19-2023 upon evaluation today patient presents today for initial evaluation here in our clinic concerning issues she has been having with wounds on her hands that come intermittently. With that being said currently she has a wound which actually began she says around February 13, 2023. Initially showed up as a white bump that then became what she feels like was pus filled. 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 Nonetheless her primary care provider did put her on the standard treatment that he starts whenever this happens that is prednisone in a tapering Dosepak which she is now on the 2 pills a day for 3 days part of the regimen and then subsequently he also put her on doxycycline for 7 days total. With that being said there does appear to be some signs of potential release for infection and I do believe that this has been somewhat beneficial for her which is good news. I do not see any signs of active  infection locally nor systemically at this time. The patient does have a history of scleroderma, Raynaud's syndrome, and hypertension. 02-27-2023 upon evaluation today patient appears to be doing well currently in regard to her finger ulcer. This actually looks to be much less inflamed compared to where we have been. Fortunately I do not see any signs of active infection locally nor systemically the wound looks better and smaller it also looks much less irritated compared to where it was I see no signs of active infection at this point all of which is excellent news. 03-05-2023 upon evaluation today patient appears to be doing well currently in regard to her wound. This is actually showing signs of improvement which is great [name], [name] ([id number]) [id number] Page 4 of 5 news and very pleased in that regard. Fortunately there does not appear to be any signs of infection locally nor systemically which is great news. 03-12-2023 upon evaluation today patient appears to be doing well currently in regard to her wound. She has been tolerating the dressing changes without complication. Fortunately there does not appear to be any signs of active infection locally nor systemically which is great news as well. No fevers, chills, nausea, vomiting, or diarrhea. Objective Constitutional Well-nourished and well-hydrated in no acute distress. Vitals Time Taken: 1:31 PM, Height: 63 in, Weight: 125 lbs, BMI: 22.1, Temperature: 98.2 F, Pulse: 72 bpm, Respiratory Rate: 16 breaths/min, Blood Pressure: 144/80 mmHg. Respiratory normal breathing without difficulty. Psychiatric this patient is able to make decisions and demonstrates good insight into disease process. Alert and Oriented x 3. pleasant and cooperative. General Notes: Upon inspection patient's wound bed showed signs of good granulation epithelization she has been doing extremely well with the Xeroform gauze which I think is  doing a great job here and overall I am extremely pleased with where we stand currently. No fevers, chills, nausea, vomiting, or diarrhea. Integumentary (Hair, Skin) Wound #1 status is Open. Original cause of wound was Gradually Appeared. The date acquired was: 02/13/2023. The wound has been in treatment 3 weeks. The wound is located on the Left Hand - 4th Digit. The wound measures 2cm length x 1.7cm width x  0.2cm depth; 2.67cm^2 area and 0.534cm^3 volume. There is Fat Layer (Subcutaneous Tissue) exposed. There is no tunneling or undermining noted. There is a medium amount of serosanguineous drainage noted. There is medium (34-66%) granulation within the wound bed. There is a medium (34-66%) amount of necrotic tissue within the wound bed including Adherent Slough. Assessment Active Problems ICD-10 Other systemic sclerosis Non-pressure chronic ulcer of skin of other sites with other specified severity Raynaud's syndrome without gangrene Essential (primary) hypertension Plan Follow-up Appointments: Return Appointment in 1 week. Bathing/ Shower/ Hygiene: May shower; gently cleanse wound with antibacterial soap, rinse and pat dry prior to dressing wounds Anesthetic (Use 'Patient Medications' Section for Anesthetic Order Entry): Lidocaine applied to wound bed WOUND #1: - Hand - 4th Digit Wound Laterality: Left Cleanser: Soap and Water 1 x Per Day/30 Days Discharge Instructions: Gently cleanse wound with antibacterial soap, rinse and pat dry prior to dressing wounds Cleanser: Wound Cleanser 1 x Per Day/30 Days Discharge Instructions: Wash your hands with soap and water. Remove old dressing, discard into plastic bag and place into trash. Cleanse the wound with Wound Cleanser prior to applying a clean dressing using gauze sponges, not tissues or cotton balls. Do not scrub or use excessive force. Pat dry using gauze sponges, not tissue or cotton balls. Prim Dressing: Xeroform 5x9-HBD (in/in) 1 x Per  Day/30 Days ary Discharge Instructions: Apply Xeroform 5x9-HBD (in/in) as directed Secondary Dressing: Gauze 1 x Per Day/30 Days Secured With: Medipore T - 20M Medipore H Soft Cloth Surgical T ape ape, 2x2 (in/yd) 1 x Per Day/30 Days 1. I would recommend that since she is doing so well with the current measures we continue with the wound care measures as before and she is in agreement the plan this includes the use of the Xeroform gauze which I think is doing awesome. 2. I am good recommend as well that she should continue to change this dressing daily on the hand I think this is just the best practice in general. We will see patient back for reevaluation in 1 week here in the clinic. If anything worsens or changes patient will contact our office for additional recommendations. [name], [name] ([id number]) [id number] Page 5 of 5 Electronic Signature(s) Signed: 03/12/2023 2:02:19 PM By: Allen Derry PA-C Entered By: Allen Derry on 03/12/2023 14:02:19 -------------------------------------------------------------------------------- SuperBill Details Patient Name: Date of Service: Tammy Rushing F. 03/12/2023 Medical Record Number: [id number] Patient Account Number: [id number] Date of Birth/Sex: Treating RN: 05-Apr-1946 (77 y.o. [name] Primary Care Provider: Marisue Simmons Other Clinician: Referring Provider: Treating Provider/Extender: [name] Weeks in Treatment: 3 Diagnosis Coding ICD-10 Codes Code Description M34.89 Other systemic sclerosis L98.498 Non-pressure chronic ulcer of skin of other sites with other specified severity I73.00 Raynaud's syndrome without gangrene I10 Essential (primary) hypertension Facility Procedures : CPT4 Code: [id number] Description: 30865 - WOUND CARE VISIT-LEV 2 EST PT Modifier: Quantity: 1 Physician Procedures : CPT4 Code Description Modifier [id number] - WC PHYS LEVEL 3 - EST PT ICD-10  Diagnosis Description M34.89 Other systemic sclerosis L98.498 Non-pressure chronic ulcer of skin of other sites with other specified severity I73.00 Raynaud's syndrome  without gangrene I10 Essential (primary) hypertension Quantity: 1 Electronic Signature(s) Signed: 03/12/2023 2:06:31 PM By: Allen Derry PA-C Entered By: Allen Derry on 03/12/2023 14:06:31

## 2023-03-13 NOTE — Progress Notes (Addendum)
KENZINGTON, MIELKE ([id number]) [id number] Page 1 of 7 Visit Report for 03/12/2023 Arrival Information Details Patient Name: Date of Service: [name], [name] 03/12/2023 1:30 PM Medical Record Number: [id number] Patient Account Number: [id number] Date of Birth/Sex: Treating RN: 1946-07-20 (77 y.o. Freddy Finner Primary Care Saray Capasso: Marisue Ivan Other Clinician: Referring Karsyn Jamie: Treating Mandeep Ferch/Extender: Rebecka Apley in Treatment: 3 Visit Information History Since Last Visit Added or deleted any medications: No Patient Arrived: Ambulatory Any new allergies or adverse reactions: No Arrival Time: 13:24 Had a fall or experienced change in No Accompanied By: self activities of daily living that may affect Transfer Assistance: None risk of falls: Patient Identification Verified: Yes Signs or symptoms of abuse/neglect since last visito No Secondary Verification Process Completed: Yes Hospitalized since last visit: No Patient Requires Transmission-Based Precautions: No Implantable device outside of the clinic excluding No Patient Has Alerts: Yes cellular tissue based products placed in the center Patient Alerts: PACEMAKER since last visit: Has Dressing in Place as Prescribed: Yes Pain Present Now: No Electronic Signature(s) Signed: 03/12/2023 3:34:54 PM By: Yevonne Pax RN Entered By: Yevonne Pax on 03/12/2023 13:31:39 -------------------------------------------------------------------------------- Clinic Level of Care Assessment Details Patient Name: Date of Service: [name], [name] 03/12/2023 1:30 PM Medical Record Number: [id number] Patient Account Number: [id number] Date of Birth/Sex: Treating RN: [birth year] (77 y.o. Freddy Finner Primary Care Andreika Vandagriff: Marisue Ivan Other Clinician: Referring Keirah Konitzer: Treating Tasharra Nodine/Extender: Rebecka Apley in Treatment: 3 Clinic Level of Care Assessment  Items TOOL 4 Quantity Score X- 1 0 Use when only an EandM is performed on FOLLOW-UP visit ASSESSMENTS - Nursing Assessment / Reassessment X- 1 10 Reassessment of Co-morbidities (includes updates in patient status) X- 1 5 Reassessment of Adherence to Treatment Plan ASSESSMENTS - Wound and Skin A ssessment / Reassessment X - Simple Wound Assessment / Reassessment - one wound 1 5 []  - 0 Complex Wound Assessment / Reassessment - multiple wounds []  - 0 Dermatologic / Skin Assessment (not related to wound area) ASSESSMENTS - Focused Assessment []  - 0 Circumferential Edema Measurements - multi extremities []  - 0 Nutritional Assessment / Counseling / Intervention []  - 0 Lower Extremity Assessment (monofilament, tuning fork, pulses) []  - 0 Peripheral Arterial Disease Assessment (using hand held doppler) ASSESSMENTS - Ostomy and/or Continence Assessment and Care []  - 0 Incontinence Assessment and Management []  - 0 Ostomy Care Assessment and Management (repouching, etc.) PROCESS - Coordination of Care X - Simple Patient / Family Education for ongoing care 1 [address] [place] F ([id number]) [id number] Page 2 of 7 []  - 0 Complex (extensive) Patient / Family Education for ongoing care []  - 0 Staff obtains Chiropractor, Records, T Results / Process Orders est []  - 0 Staff telephones HHA, Nursing Homes / Clarify orders / etc []  - 0 Routine Transfer to another Facility (non-emergent condition) []  - 0 Routine Hospital Admission (non-emergent condition) []  - 0 New Admissions / Manufacturing engineer / Ordering NPWT Apligraf, etc. , []  - 0 Emergency Hospital Admission (emergent condition) X- 1 10 Simple Discharge Coordination []  - 0 Complex (extensive) Discharge Coordination PROCESS - Special Needs []  - 0 Pediatric / Minor Patient Management []  - 0 Isolation Patient Management []  - 0 Hearing / Language / Visual special needs []  - 0 Assessment of Community  assistance (transportation, D/C planning, etc.) []  - 0 Additional assistance / Altered mentation []  - 0 Support Surface(s) Assessment (bed, cushion, seat, etc.) INTERVENTIONS - Wound Cleansing / Measurement X - Simple  Wound Cleansing - one wound 1 5 []  - 0 Complex Wound Cleansing - multiple wounds X- 1 5 Wound Imaging (photographs - any number of wounds) []  - 0 Wound Tracing (instead of photographs) X- 1 5 Simple Wound Measurement - one wound []  - 0 Complex Wound Measurement - multiple wounds INTERVENTIONS - Wound Dressings X - Small Wound Dressing one or multiple wounds 1 10 []  - 0 Medium Wound Dressing one or multiple wounds []  - 0 Large Wound Dressing one or multiple wounds []  - 0 Application of Medications - topical []  - 0 Application of Medications - injection INTERVENTIONS - Miscellaneous []  - 0 External ear exam []  - 0 Specimen Collection (cultures, biopsies, blood, body fluids, etc.) []  - 0 Specimen(s) / Culture(s) sent or taken to Lab for analysis []  - 0 Patient Transfer (multiple staff / Nurse, adult / Similar devices) []  - 0 Simple Staple / Suture removal (25 or less) []  - 0 Complex Staple / Suture removal (26 or more) []  - 0 Hypo / Hyperglycemic Management (close monitor of Blood Glucose) []  - 0 Ankle / Brachial Index (ABI) - do not check if billed separately X- 1 5 Vital Signs Has the patient been seen at the hospital within the last three years: Yes Total Score: 75 Level Of Care: New/Established - Level 2 Electronic Signature(s) Signed: 03/12/2023 3:34:54 PM By: Yevonne Pax RN Entered By: Yevonne Pax on 03/12/2023 13:59:43 Marijean Niemann F ([id number]) [id number] Page 3 of 7 -------------------------------------------------------------------------------- Encounter Discharge Information Details Patient Name: Date of Service: [name], [name] 03/12/2023 1:30 PM Medical Record Number: [id number] Patient Account Number:  [id number] Date of Birth/Sex: Treating RN: September 17, 1946 (77 y.o. Freddy Finner Primary Care Ger Nicks: Marisue Ivan Other Clinician: Referring Shaquil Aldana: Treating Roe Koffman/Extender: Rebecka Apley in Treatment: 3 Encounter Discharge Information Items Discharge Condition: Stable Ambulatory Status: Ambulatory Discharge Destination: Home Transportation: Private Auto Accompanied By: self Schedule Follow-up Appointment: Yes Clinical Summary of Care: Electronic Signature(s) Signed: 03/12/2023 3:34:54 PM By: Yevonne Pax RN Entered By: Yevonne Pax on 03/12/2023 14:00:34 -------------------------------------------------------------------------------- Lower Extremity Assessment Details Patient Name: Date of Service: [name], [name] 03/12/2023 1:30 PM Medical Record Number: [id number] Patient Account Number: [id number] Date of Birth/Sex: Treating RN: [birth year] (77 y.o. Freddy Finner Primary Care Marchel Foote: Marisue Ivan Other Clinician: Referring Lacie Landry: Treating Aleane Wesenberg/Extender: Maxwell Caul Weeks in Treatment: 3 Electronic Signature(s) Signed: 03/12/2023 3:34:54 PM By: Yevonne Pax RN Entered By: Yevonne Pax on 03/12/2023 13:36:41 -------------------------------------------------------------------------------- Multi Wound Chart Details Patient Name: Date of Service: [name], Tammy Hong F. 03/12/2023 1:30 PM Medical Record Number: [id number] Patient Account Number: [id number] Date of Birth/Sex: Treating RN: 05/12/1946 (77 y.o. Freddy Finner Primary Care Sheniya Garciaperez: Marisue Ivan Other Clinician: Referring Tashae Inda: Treating Octave Montrose/Extender: Maxwell Caul Weeks in Treatment: 3 Vital Signs Height(in): 63 Pulse(bpm): 72 Weight(lbs): 125 Blood Pressure(mmHg): 144/80 Body Mass Index(BMI): 22.1 Temperature(F): 98.2 Respiratory Rate(breaths/min): 16 [1:Photos:] [N/A:N/A] ZERA, MARKWARDT ([id number]) [1:Left Hand -  4th Digit Wound Location: Gradually Appeared Wounding Event: Atypical Primary Etiology: Hypertension, Raynauds, Comorbid History: Scleroderma 02/13/2023 Date Acquired: 3 Weeks of Treatment: Open Wound Status: No Wound Recurrence: Yes  Pending A mputation on Presentation: 2x1.7x0.2 Measurements L x W x D (cm) 2.67 A (cm) : rea 0.534 Volume (cm) : 66.00% % Reduction in A rea: 66.00% % Reduction in Volume: Full Thickness Without Exposed Classification: Support Structures Medium Exudate  Amount: Serosanguineous Exudate Type: red, brown Exudate Color: Medium (34-66%) Granulation Amount: Medium (34-66%) Necrotic  Amount: Fat Layer (Subcutaneous Tissue): Yes N/A Exposed Structures: Fascia: No Tendon: No Muscle: No Joint: No Bone: No None  Epithelialization:] [N/A:N/A N/A N/A N/A N/A N/A N/A N/A N/A N/A N/A N/A N/A N/A N/A N/A N/A N/A N/A N/A N/A] Treatment Notes Electronic Signature(s) Signed: 03/12/2023 3:34:54 PM By: Yevonne Pax RN Entered By: Yevonne Pax on 03/12/2023 13:36:46 -------------------------------------------------------------------------------- Multi-Disciplinary Care Plan Details Patient Name: Date of Service: [name] 03/12/2023 1:30 PM Medical Record Number: [id number] Patient Account Number: [id number] Date of Birth/Sex: Treating RN: 1946-10-01 (77 y.o. Freddy Finner Primary Care Denim Kalmbach: Marisue Ivan Other Clinician: Referring Hoy Fallert: Treating Glorimar Stroope/Extender: Rebecka Apley in Treatment: 3 Active Inactive Necrotic Tissue Nursing Diagnoses: Knowledge deficit related to management of necrotic/devitalized tissue Goals: Necrotic/devitalized tissue will be minimized in the wound bed Date Initiated: 02/19/2023 Target Resolution Date: 03/21/2023 Goal Status: Active Patient/caregiver will verbalize understanding of reason and process for debridement of necrotic tissue Date Initiated: 02/19/2023 Target Resolution Date: 03/21/2023 Goal Status:  Active Interventions: Assess patient pain level pre-, during and post procedure and prior to discharge Treatment Activities: Apply topical anesthetic as ordered : 02/19/2023 Notes: Wound/Skin Impairment Nursing Diagnoses: Knowledge deficit related to ulceration/compromised skin integrity CHANDI, NICKLIN ([id number]) [id number] Page 5 of 7 Goals: Patient/caregiver will verbalize understanding of skin care regimen Date Initiated: 02/19/2023 Target Resolution Date: 03/21/2023 Goal Status: Active Ulcer/skin breakdown will have a volume reduction of 30% by week 4 Date Initiated: 02/19/2023 Target Resolution Date: 03/21/2023 Goal Status: Active Ulcer/skin breakdown will have a volume reduction of 50% by week 8 Date Initiated: 02/19/2023 Target Resolution Date: 04/21/2023 Goal Status: Active Ulcer/skin breakdown will have a volume reduction of 80% by week 12 Date Initiated: 02/19/2023 Target Resolution Date: 05/21/2023 Goal Status: Active Ulcer/skin breakdown will heal within 14 weeks Date Initiated: 02/19/2023 Target Resolution Date: 06/21/2023 Goal Status: Active Interventions: Assess patient/caregiver ability to obtain necessary supplies Assess patient/caregiver ability to perform ulcer/skin care regimen upon admission and as needed Assess ulceration(s) every visit Notes: Electronic Signature(s) Signed: 03/12/2023 3:34:54 PM By: Yevonne Pax RN Entered By: Yevonne Pax on 03/12/2023 13:37:00 -------------------------------------------------------------------------------- Pain Assessment Details Patient Name: Date of Service: ANSHU, WEHNER 03/12/2023 1:30 PM Medical Record Number: [id number] Patient Account Number: [id number] Date of Birth/Sex: Treating RN: 10-03-1946 (77 y.o. Freddy Finner Primary Care Misbah Hornaday: Marisue Ivan Other Clinician: Referring Dray Dente: Treating Jodette Wik/Extender: Maxwell Caul Weeks in Treatment: 3 Active  Problems Location of Pain Severity and Description of Pain Patient Has Paino No Site Locations Pain Management and Medication Current Pain Management: Electronic Signature(s) Signed: 03/12/2023 3:34:54 PM By: Yevonne Pax RN Entered By: Yevonne Pax on 03/12/2023 13:32:03 Despina Hick ([id number]) [id number] Page 6 of 7 -------------------------------------------------------------------------------- Patient/Caregiver Education Details Patient Name: Date of Service: JOZIE, WULF 4/25/2024andnbsp1:30 PM Medical Record Number: [id number] Patient Account Number: [id number] Date of Birth/Gender: Treating RN: 05/14/1946 (77 y.o. Freddy Finner Primary Care Physician: Marisue Ivan Other Clinician: Referring Physician: Treating Physician/Extender: Rebecka Apley in Treatment: 3 Education Assessment Education Provided To: Patient Education Topics Provided Wound Debridement: Handouts: Wound Debridement Methods: Explain/Verbal Responses: State content correctly Electronic Signature(s) Signed: 03/12/2023 3:34:54 PM By: Yevonne Pax RN Entered By: Yevonne Pax on 03/12/2023 13:37:17 -------------------------------------------------------------------------------- Wound Assessment Details Patient Name: Date of Service: [name] 03/12/2023 1:30 PM Medical Record Number: [id number] Patient Account Number: [id number] Date of Birth/Sex: Treating RN: 15-Jun-1946 (77 y.o. Freddy Finner Primary Care Brice Kossman: Marisue Ivan Other Clinician:  Referring Faydra Korman: Treating Haven Pylant/Extender: Maxwell Caul Weeks in Treatment: 3 Wound Status Wound Number: 1 Primary Etiology: Atypical Wound Location: Left Hand - 4th Digit Wound Status: Open Wounding Event: Gradually Appeared Comorbid History: Hypertension, Raynauds, Scleroderma Date Acquired: 02/13/2023 Weeks Of Treatment: 3 Clustered Wound: No Pending  Amputation On Presentation Photos Wound Measurements Length: (cm) 2 Width: (cm) 1.7 Depth: (cm) 0.2 Area: (cm) 2.67 Volume: (cm) 0.534 % Reduction in Area: 66% % Reduction in Volume: 66% Epithelialization: None Tunneling: No Undermining: No Wound Description Classification: Full Thickness Without Exposed Support Structures Exudate Amount: Medium TANAKA, GILLEN ([id number]) Exudate Type: Serosanguineous Exudate Color: red, brown Foul Odor After Cleansing: No Slough/Fibrino Yes [id number] Page 7 of 7 Wound Bed Granulation Amount: Medium (34-66%) Exposed Structure Necrotic Amount: Medium (34-66%) Fascia Exposed: No Necrotic Quality: Adherent Slough Fat Layer (Subcutaneous Tissue) Exposed: Yes Tendon Exposed: No Muscle Exposed: No Joint Exposed: No Bone Exposed: No Treatment Notes Wound #1 (Hand - 4th Digit) Wound Laterality: Left Cleanser Soap and Water Discharge Instruction: Gently cleanse wound with antibacterial soap, rinse and pat dry prior to dressing wounds Wound Cleanser Discharge Instruction: Wash your hands with soap and water. Remove old dressing, discard into plastic bag and place into trash. Cleanse the wound with Wound Cleanser prior to applying a clean dressing using gauze sponges, not tissues or cotton balls. Do not scrub or use excessive force. Pat dry using gauze sponges, not tissue or cotton balls. Peri-Wound Care Topical Primary Dressing Xeroform 5x9-HBD (in/in) Discharge Instruction: Apply Xeroform 5x9-HBD (in/in) as directed Secondary Dressing Gauze Discharge Instruction: As directed: dry, moistened with saline or moistened with Dakins Solution Secured With Medipore T - 78M Medipore H Soft Cloth Surgical T ape ape, 2x2 (in/yd) Compression Wrap Compression Stockings Add-Ons Electronic Signature(s) Signed: 03/12/2023 3:34:54 PM By: Yevonne Pax RN Entered By: Yevonne Pax on 03/12/2023  13:36:31 -------------------------------------------------------------------------------- Vitals Details Patient Name: Date of Service: [name], Tammy Hong F. 03/12/2023 1:30 PM Medical Record Number: [id number] Patient Account Number: [id number] Date of Birth/Sex: Treating RN: May 18, 1946 (77 y.o. Freddy Finner Primary Care Hoa Briggs: Marisue Ivan Other Clinician: Referring Hadley Soileau: Treating Nataline Basara/Extender: Rebecka Apley in Treatment: 3 Vital Signs Time Taken: 13:31 Temperature (F): 98.2 Height (in): 63 Pulse (bpm): 72 Weight (lbs): 125 Respiratory Rate (breaths/min): 16 Body Mass Index (BMI): 22.1 Blood Pressure (mmHg): 144/80 Reference Range: 80 - 120 mg / dl Electronic Signature(s) Signed: 03/12/2023 3:34:54 PM By: Yevonne Pax RN Entered By: Yevonne Pax on 03/12/2023 13:31:56

## 2023-03-17 ENCOUNTER — Telehealth: Payer: Self-pay

## 2023-03-17 DIAGNOSIS — C44319 Basal cell carcinoma of skin of other parts of face: Secondary | ICD-10-CM

## 2023-03-17 NOTE — Telephone Encounter (Signed)
-----   Message from [place], MD sent at 03/17/2023  1:55 PM EDT ----- Skin , right medial cheek BASAL CELL CARCINOMA, NODULAR PATTERN --> recommend Mohs surgery as best option  Mohs involves cutting out right around the spot and then checking under the microscope to be sure the whole skin cancer is out. The cure rate is about 98-99%. It is done at another office outside of [place] ([place], [place], or [place]). Once the Mohs surgeon confirms the skin cancer is out, they will discuss the options to repair or heal the area. You must take it easy for about two weeks after surgery (no lifting over 10-15 lbs, avoid activity to get your heart rate and blood pressure up).   ED&C has about an 85% cure rate and leaves a round wound the size of the skin cancer which is healed with ointment and a bandage over a few weeks time. It leaves a round white scar. No additional pathology is done. If the skin cancer were to come back, we would need to do a surgery to remove it.   MAs please call with results and refer. Please let me know if they have any questions. Thank you!

## 2023-03-17 NOTE — Telephone Encounter (Signed)
Patient advised pathology showed BCC, will refer for Mohs at [facility]. Butch Penny., RMA

## 2023-03-19 ENCOUNTER — Encounter: Payer: Medicare HMO | Attending: Physician Assistant | Admitting: Physician Assistant

## 2023-03-19 DIAGNOSIS — L98498 Non-pressure chronic ulcer of skin of other sites with other specified severity: Secondary | ICD-10-CM | POA: Diagnosis not present

## 2023-03-19 DIAGNOSIS — I7300 Raynaud's syndrome without gangrene: Secondary | ICD-10-CM | POA: Diagnosis not present

## 2023-03-19 DIAGNOSIS — I10 Essential (primary) hypertension: Secondary | ICD-10-CM | POA: Insufficient documentation

## 2023-03-19 DIAGNOSIS — M349 Systemic sclerosis, unspecified: Secondary | ICD-10-CM | POA: Diagnosis not present

## 2023-03-20 NOTE — Progress Notes (Signed)
[name], [name] ([id number]) [id number] Page 1 of 7 Visit Report for 03/19/2023 Arrival Information Details Patient Name: Date of Service: [name], [name] 03/19/2023 12:30 PM Medical Record Number: [id number] Patient Account Number: [id number] Date of Birth/Sex: Treating RN: 20-Oct-1946 (77 y.o. Freddy Finner Primary Care Wang [place]: Marisue Ivan Other Clinician: Referring Aryn Safran: Treating Asyah Candler/Extender: Rebecka Apley in Treatment: 4 Visit Information History Since Last Visit Added or deleted any medications: No Patient Arrived: Ambulatory Any new allergies or adverse reactions: No Arrival Time: 12:37 Had a fall or experienced change in No Accompanied By: self activities of daily living that may affect Transfer Assistance: None risk of falls: Patient Identification Verified: Yes Signs or symptoms of abuse/neglect since last visito No Secondary Verification Process Completed: Yes Hospitalized since last visit: No Patient Requires Transmission-Based Precautions: No Implantable device outside of the clinic excluding No Patient Has Alerts: Yes cellular tissue based products placed in the center Patient Alerts: PACEMAKER since last visit: Has Dressing in Place as Prescribed: Yes Pain Present Now: No Electronic Signature(s) Signed: 03/20/2023 11:36:48 AM By: Yevonne Pax RN Entered By: Yevonne Pax on 03/19/2023 12:41:07 -------------------------------------------------------------------------------- Clinic Level of Care Assessment Details Patient Name: Date of Service: [name], [name] 03/19/2023 12:30 PM Medical Record Number: [id number] Patient Account Number: [id number] Date of Birth/Sex: Treating RN: March 08, 1946 (77 y.o. Freddy Finner Primary Care Deneice Wack: Marisue Ivan Other Clinician: Referring Larico Dimock: Treating Azavion Bouillon/Extender: Rebecka Apley in Treatment: 4 Clinic Level of Care Assessment  Items TOOL 4 Quantity Score X- 1 0 Use when only an EandM is performed on FOLLOW-UP visit ASSESSMENTS - Nursing Assessment / Reassessment X- 1 10 Reassessment of Co-morbidities (includes updates in patient status) X- 1 5 Reassessment of Adherence to Treatment Plan ASSESSMENTS - Wound and Skin A ssessment / Reassessment X - Simple Wound Assessment / Reassessment - one wound 1 5 []  - 0 Complex Wound Assessment / Reassessment - multiple wounds []  - 0 Dermatologic / Skin Assessment (not related to wound area) ASSESSMENTS - Focused Assessment []  - 0 Circumferential Edema Measurements - multi extremities []  - 0 Nutritional Assessment / Counseling / Intervention []  - 0 Lower Extremity Assessment (monofilament, tuning fork, pulses) []  - 0 Peripheral Arterial Disease Assessment (using hand held doppler) ASSESSMENTS - Ostomy and/or Continence Assessment and Care []  - 0 Incontinence Assessment and Management []  - 0 Ostomy Care Assessment and Management (repouching, etc.) PROCESS - Coordination of Care X - Simple Patient / Family Education for ongoing care 1 [address] [place] F ([id number]) [id number] Page 2 of 7 []  - 0 Complex (extensive) Patient / Family Education for ongoing care []  - 0 Staff obtains Chiropractor, Records, T Results / Process Orders est []  - 0 Staff telephones HHA, Nursing Homes / Clarify orders / etc []  - 0 Routine Transfer to another Facility (non-emergent condition) []  - 0 Routine Hospital Admission (non-emergent condition) []  - 0 New Admissions / Manufacturing engineer / Ordering NPWT Apligraf, etc. , []  - 0 Emergency Hospital Admission (emergent condition) X- 1 10 Simple Discharge Coordination []  - 0 Complex (extensive) Discharge Coordination PROCESS - Special Needs []  - 0 Pediatric / Minor Patient Management []  - 0 Isolation Patient Management []  - 0 Hearing / Language / Visual special needs []  - 0 Assessment of Community  assistance (transportation, D/C planning, etc.) []  - 0 Additional assistance / Altered mentation []  - 0 Support Surface(s) Assessment (bed, cushion, seat, etc.) INTERVENTIONS - Wound Cleansing / Measurement X - Simple  Wound Cleansing - one wound 1 5 []  - 0 Complex Wound Cleansing - multiple wounds X- 1 5 Wound Imaging (photographs - any number of wounds) []  - 0 Wound Tracing (instead of photographs) X- 1 5 Simple Wound Measurement - one wound []  - 0 Complex Wound Measurement - multiple wounds INTERVENTIONS - Wound Dressings X - Small Wound Dressing one or multiple wounds 1 10 []  - 0 Medium Wound Dressing one or multiple wounds []  - 0 Large Wound Dressing one or multiple wounds []  - 0 Application of Medications - topical []  - 0 Application of Medications - injection INTERVENTIONS - Miscellaneous []  - 0 External ear exam []  - 0 Specimen Collection (cultures, biopsies, blood, body fluids, etc.) []  - 0 Specimen(s) / Culture(s) sent or taken to Lab for analysis []  - 0 Patient Transfer (multiple staff / Nurse, adult / Similar devices) []  - 0 Simple Staple / Suture removal (25 or less) []  - 0 Complex Staple / Suture removal (26 or more) []  - 0 Hypo / Hyperglycemic Management (close monitor of Blood Glucose) []  - 0 Ankle / Brachial Index (ABI) - do not check if billed separately X- 1 5 Vital Signs Has the patient been seen at the hospital within the last three years: Yes Total Score: 75 Level Of Care: New/Established - Level 2 Electronic Signature(s) Unsigned Entered ByYevonne Pax on 03/23/2023 10:29:39 Despina Hick ([id number]) [id number] Page 3 of 7 -------------------------------------------------------------------------------- Encounter Discharge Information Details Patient Name: Date of Service: [name], [name] 03/19/2023 12:30 PM Medical Record Number: [id number] Patient Account Number: [id number] Date of Birth/Sex: Treating  RN: July 26, 1946 (77 y.o. Freddy Finner Primary Care Mohd. Derflinger: Marisue Ivan Other Clinician: Referring Norena Bratton: Treating Thatcher Doberstein/Extender: Rebecka Apley in Treatment: 4 Encounter Discharge Information Items Discharge Condition: Stable Ambulatory Status: Ambulatory Discharge Destination: Home Transportation: Private Auto Accompanied By: self Schedule Follow-up Appointment: Yes Clinical Summary of Care: Electronic Signature(s) Signed: 03/23/2023 10:30:23 AM By: Yevonne Pax RN Entered By: Yevonne Pax on 03/23/2023 10:30:23 -------------------------------------------------------------------------------- Lower Extremity Assessment Details Patient Name: Date of Service: [name] 03/19/2023 12:30 PM Medical Record Number: [id number] Patient Account Number: [id number] Date of Birth/Sex: Treating RN: February 04, 1946 (77 y.o. Freddy Finner Primary Care Fenton Candee: Marisue Ivan Other Clinician: Referring Annete Ayuso: Treating Veida Spira/Extender: Maxwell Caul Weeks in Treatment: 4 Electronic Signature(s) Signed: 03/20/2023 11:36:48 AM By: Yevonne Pax RN Entered By: Yevonne Pax on 03/19/2023 12:48:59 -------------------------------------------------------------------------------- Multi Wound Chart Details Patient Name: Date of Service: [name], Tammy Hong F. 03/19/2023 12:30 PM Medical Record Number: [id number] Patient Account Number: [id number] Date of Birth/Sex: Treating RN: [birth year] (77 y.o. Freddy Finner Primary Care Lariya Kinzie: Marisue Ivan Other Clinician: Referring Oval Cavazos: Treating Milana Salay/Extender: Rebecka Apley in Treatment: 4 Vital Signs Height(in): 63 Pulse(bpm): 66 Weight(lbs): 125 Blood Pressure(mmHg): 124/72 Body Mass Index(BMI): 22.1 Temperature(F): 98 Respiratory Rate(breaths/min): 18 [1:Photos:] [N/A:N/A] Left Hand - 4th Digit N/A N/A Wound Location: Gradually Appeared N/A  N/A Wounding Event: Atypical N/A N/A Primary Etiology: Hypertension, Raynauds, N/A N/A Comorbid History: Scleroderma 02/13/2023 N/A N/A Date Acquired: 4 N/A N/A Weeks of Treatment: Open N/A N/A Wound Status: No N/A N/A Wound Recurrence: Yes N/A N/A Pending A mputation on Presentation: 1x1.5x0.2 N/A N/A Measurements L x W x D (cm) 1.178 N/A N/A A (cm) : rea 0.236 N/A N/A Volume (cm) : 85.00% N/A N/A % Reduction in A rea: 85.00% N/A N/A % Reduction in Volume: Full Thickness Without Exposed N/A N/A Classification: Support Structures  Medium N/A N/A Exudate Amount: Serosanguineous N/A N/A Exudate Type: red, brown N/A N/A Exudate Color: Medium (34-66%) N/A N/A Granulation Amount: Medium (34-66%) N/A N/A Necrotic Amount: Fat Layer (Subcutaneous Tissue): Yes N/A N/A Exposed Structures: Fascia: No Tendon: No Muscle: No Joint: No Bone: No None N/A N/A Epithelialization: Treatment Notes Electronic Signature(s) Signed: 03/20/2023 11:36:48 AM By: Yevonne Pax RN Entered By: Yevonne Pax on 03/19/2023 12:49:03 -------------------------------------------------------------------------------- Multi-Disciplinary Care Plan Details Patient Name: Date of Service: Tammy Rushing F. 03/19/2023 12:30 PM Medical Record Number: [id number] Patient Account Number: [id number] Date of Birth/Sex: Treating RN: Mar 27, 1946 (77 y.o. Freddy Finner Primary Care Eryx Zane: Marisue Ivan Other Clinician: Referring Adrien Shankar: Treating Liany Mumpower/Extender: Rebecka Apley in Treatment: 4 Active Inactive Necrotic Tissue Nursing Diagnoses: Knowledge deficit related to management of necrotic/devitalized tissue Goals: Necrotic/devitalized tissue will be minimized in the wound bed Date Initiated: 02/19/2023 Target Resolution Date: 03/21/2023 Goal Status: Active Patient/caregiver will verbalize understanding of reason and process for debridement of necrotic tissue Date  Initiated: 02/19/2023 Target Resolution Date: 03/21/2023 Goal Status: Active Interventions: Assess patient pain level pre-, during and post procedure and prior to discharge Treatment Activities: Apply topical anesthetic as ordered : 02/19/2023 Notes: Wound/Skin Impairment Nursing Diagnoses: MADDILYNN, BONZO ([id number]) [id number] Page 5 of 7 Knowledge deficit related to ulceration/compromised skin integrity Goals: Patient/caregiver will verbalize understanding of skin care regimen Date Initiated: 02/19/2023 Target Resolution Date: 03/21/2023 Goal Status: Active Ulcer/skin breakdown will have a volume reduction of 30% by week 4 Date Initiated: 02/19/2023 Target Resolution Date: 03/21/2023 Goal Status: Active Ulcer/skin breakdown will have a volume reduction of 50% by week 8 Date Initiated: 02/19/2023 Target Resolution Date: 04/21/2023 Goal Status: Active Ulcer/skin breakdown will have a volume reduction of 80% by week 12 Date Initiated: 02/19/2023 Target Resolution Date: 05/21/2023 Goal Status: Active Ulcer/skin breakdown will heal within 14 weeks Date Initiated: 02/19/2023 Target Resolution Date: 06/21/2023 Goal Status: Active Interventions: Assess patient/caregiver ability to obtain necessary supplies Assess patient/caregiver ability to perform ulcer/skin care regimen upon admission and as needed Assess ulceration(s) every visit Notes: Electronic Signature(s) Signed: 03/20/2023 11:36:48 AM By: Yevonne Pax RN Entered By: Yevonne Pax on 03/19/2023 12:49:18 -------------------------------------------------------------------------------- Pain Assessment Details Patient Name: Date of Service: [name] 03/19/2023 12:30 PM Medical Record Number: [id number] Patient Account Number: [id number] Date of Birth/Sex: Treating RN: 08/15/1946 (77 y.o. Freddy Finner Primary Care Nalaysia Manganiello: Marisue Ivan Other Clinician: Referring Derico Mitton: Treating Dayle Mcnerney/Extender: Maxwell Caul Weeks in Treatment: 4 Active Problems Location of Pain Severity and Description of Pain Patient Has Paino No Site Locations Pain Management and Medication Current Pain Management: Electronic Signature(s) Signed: 03/20/2023 11:36:48 AM By: Yevonne Pax RN Entered By: Yevonne Pax on 03/19/2023 12:41:31 Despina Hick ([id number]) [id number] Page 6 of 7 -------------------------------------------------------------------------------- Patient/Caregiver Education Details Patient Name: Date of Service: [name], [name] 5/2/2024andnbsp12:30 PM Medical Record Number: [id number] Patient Account Number: [id number] Date of Birth/Gender: Treating RN: 1946-08-04 (77 y.o. Freddy Finner Primary Care Physician: Marisue Ivan Other Clinician: Referring Physician: Treating Physician/Extender: Rebecka Apley in Treatment: 4 Education Assessment Education Provided To: Patient Education Topics Provided Wound Debridement: Handouts: Wound Debridement Methods: Explain/Verbal Responses: State content correctly Electronic Signature(s) Signed: 03/20/2023 11:36:48 AM By: Yevonne Pax RN Entered By: Yevonne Pax on 03/19/2023 12:49:35 -------------------------------------------------------------------------------- Wound Assessment Details Patient Name: Date of Service: [name] 03/19/2023 12:30 PM Medical Record Number: [id number] Patient Account Number: [id number] Date of Birth/Sex: Treating RN: 01-28-1946 (77 y.o. F) Epps,  Carrie Primary Care Myson Levi: Marisue Ivan Other Clinician: Referring Jassmine Vandruff: Treating Traci Gafford/Extender: Maxwell Caul Weeks in Treatment: 4 Wound Status Wound Number: 1 Primary Etiology: Atypical Wound Location: Left Hand - 4th Digit Wound Status: Open Wounding Event: Gradually Appeared Comorbid History: Hypertension, Raynauds, Scleroderma Date Acquired: 02/13/2023 Weeks  Of Treatment: 4 Clustered Wound: No Pending Amputation On Presentation Photos Wound Measurements Length: (cm) 1 Width: (cm) 1.5 Depth: (cm) 0.2 Area: (cm) 1.178 Volume: (cm) 0.236 % Reduction in Area: 85% % Reduction in Volume: 85% Epithelialization: None Tunneling: No Undermining: No Wound Description Classification: Full Thickness Without Exposed Suppor PANG, GODINHO F ([id number]) Exudate Amount: Medium Exudate Type: Serosanguineous Exudate Color: red, brown t Structures Foul Odor After Cleansing: No [id number] Page 7 of 7 Slough/Fibrino Yes Wound Bed Granulation Amount: Medium (34-66%) Exposed Structure Necrotic Amount: Medium (34-66%) Fascia Exposed: No Necrotic Quality: Adherent Slough Fat Layer (Subcutaneous Tissue) Exposed: Yes Tendon Exposed: No Muscle Exposed: No Joint Exposed: No Bone Exposed: No Electronic Signature(s) Signed: 03/20/2023 11:36:48 AM By: Yevonne Pax RN Entered By: Yevonne Pax on 03/19/2023 12:48:49 -------------------------------------------------------------------------------- Vitals Details Patient Name: Date of Service: [name], Tammy Hong F. 03/19/2023 12:30 PM Medical Record Number: [id number] Patient Account Number: [id number] Date of Birth/Sex: Treating RN: 25-Jun-1946 (77 y.o. Freddy Finner Primary Care Dilia Alemany: Marisue Ivan Other Clinician: Referring Ardelle Haliburton: Treating Montoya Brandel/Extender: Rebecka Apley in Treatment: 4 Vital Signs Time Taken: 12:41 Temperature (F): 98 Height (in): 63 Pulse (bpm): 66 Weight (lbs): 125 Respiratory Rate (breaths/min): 18 Body Mass Index (BMI): 22.1 Blood Pressure (mmHg): 124/72 Reference Range: 80 - 120 mg / dl Electronic Signature(s) Signed: 03/20/2023 11:36:48 AM By: Yevonne Pax RN Entered By: Yevonne Pax on 03/19/2023 12:41:24

## 2023-03-20 NOTE — Progress Notes (Signed)
NEVA, PRUE ([id number]) [id number] Page 1 of 5 Visit Report for 03/19/2023 Chief Complaint Document Details Patient Name: Date of Service: [name], [name] 03/19/2023 12:30 PM Medical Record Number: [id number] Patient Account Number: [id number] Date of Birth/Sex: Treating RN: [birth year] (77 y.o. Freddy Finner Primary Care Provider: Marisue Ivan Other Clinician: Referring Provider: Treating Provider/Extender: Rebecka Apley in Treatment: 4 Information Obtained from: Patient Chief Complaint Left 4th finger ulcer Electronic Signature(s) Signed: 03/19/2023 12:35:38 PM By: Allen Derry PA-C Entered By: Allen Derry on 03/19/2023 12:35:38 -------------------------------------------------------------------------------- HPI Details Patient Name: Date of Service: [name], Tammy Hong F. 03/19/2023 12:30 PM Medical Record Number: [id number] Patient Account Number: [id number] Date of Birth/Sex: Treating RN: [birth year] (77 y.o. Freddy Finner Primary Care Provider: Marisue Ivan Other Clinician: Referring Provider: Treating Provider/Extender: Rebecka Apley in Treatment: 4 History of Present Illness HPI Description: 02-19-2023 upon evaluation today patient presents today for initial evaluation here in our clinic concerning issues she has been having with wounds on her hands that come intermittently. With that being said currently she has a wound which actually began she says around February 13, 2023. Initially showed up as a white bump that then became what she feels like was pus filled. 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 Nonetheless her primary care provider did put her on the standard treatment that he starts whenever this happens that is prednisone in a tapering Dosepak which she is now on the 2  pills a day for 3 days part of the regimen and then subsequently he also put her on doxycycline for 7 days total. With that being said there does appear to be some signs of potential release for infection and I do believe that this has been somewhat beneficial for her which is good news. I do not see any signs of active infection locally nor systemically at this time. The patient does have a history of scleroderma, Raynaud's syndrome, and hypertension. 02-27-2023 upon evaluation today patient appears to be doing well currently in regard to her finger ulcer. This actually looks to be much less inflamed compared to where we have been. Fortunately I do not see any signs of active infection locally nor systemically the wound looks better and smaller it also looks much less irritated compared to where it was I see no signs of active infection at this point all of which is excellent news. 03-05-2023 upon evaluation today patient appears to be doing well currently in regard to her wound. This is actually showing signs of improvement which is great news and very pleased in that regard. Fortunately there does not appear to be any signs of infection locally nor systemically which is great news. 03-12-2023 upon evaluation today patient appears to be doing well currently in regard to her wound. She has been tolerating the dressing changes without complication. Fortunately there does not appear to be any signs of active infection locally nor systemically which is great news as well. No fevers, chills, nausea, vomiting, or diarrhea. 03-19-2023 upon evaluation today patient appears to be doing well currently in regard to her wound which actually showed signs of excellent improvement. Fortunately I do not see any evidence of infection this seems to be healing quite nicely and very pleased. Electronic Signature(s) Signed: 03/19/2023 12:53:20 PM By: Allen Derry PA-C Entered By: Allen Derry  on 03/19/2023  12:53:20 -------------------------------------------------------------------------------- Physical Exam Details Patient Name: Date of Service: [name] 03/19/2023 12:30 PM Medical Record Number: [id number] Patient Account Number: [id number] Date of Birth/Sex: Treating RN: 1945/11/26 (77 y.o. Freddy Finner Primary Care Provider: Marisue Ivan Other Clinician: [name], [name] ([id number]) [id number] Page 2 of 5 Referring Provider: Treating Provider/Extender: Rebecka Apley in Treatment: 4 Constitutional Well-nourished and well-hydrated in no acute distress. Respiratory normal breathing without difficulty. Psychiatric this patient is able to make decisions and demonstrates good insight into disease process. Alert and Oriented x 3. pleasant and cooperative. Notes Upon inspection patient's wound bed actually showed signs of good granulation epithelization at this point. Fortunately I do not see any evidence of active infection locally nor systemically which is great and overall I am extremely pleased with where we stand. Electronic Signature(s) Signed: 03/19/2023 12:53:33 PM By: Allen Derry PA-C Entered By: Allen Derry on 03/19/2023 12:53:33 -------------------------------------------------------------------------------- Physician Orders Details Patient Name: Date of Service: Tammy Rushing F. 03/19/2023 12:30 PM Medical Record Number: [id number] Patient Account Number: [id number] Date of Birth/Sex: Treating RN: 05/31/1946 (77 y.o. Freddy Finner Primary Care Provider: Marisue Ivan Other Clinician: Referring Provider: Treating Provider/Extender: Rebecka Apley in Treatment: 4 Verbal / Phone Orders: No Diagnosis Coding ICD-10 Coding Code Description M34.89 Other systemic sclerosis L98.498 Non-pressure chronic ulcer of skin of other sites with other specified severity I73.00 Raynaud's syndrome without  gangrene I10 Essential (primary) hypertension Follow-up Appointments Return Appointment in 1 week. Bathing/ Shower/ Hygiene May shower; gently cleanse wound with antibacterial soap, rinse and pat dry prior to dressing wounds Anesthetic (Use 'Patient Medications' Section for Anesthetic Order Entry) Lidocaine applied to wound bed Wound Treatment Wound #1 - Hand - 4th Digit Wound Laterality: Left Cleanser: Soap and Water 1 x Per Day/30 Days Discharge Instructions: Gently cleanse wound with antibacterial soap, rinse and pat dry prior to dressing wounds Cleanser: Wound Cleanser 1 x Per Day/30 Days Discharge Instructions: Wash your hands with soap and water. Remove old dressing, discard into plastic bag and place into trash. Cleanse the wound with Wound Cleanser prior to applying a clean dressing using gauze sponges, not tissues or cotton balls. Do not scrub or use excessive force. Pat dry using gauze sponges, not tissue or cotton balls. Prim Dressing: Xeroform 5x9-HBD (in/in) 1 x Per Day/30 Days ary Discharge Instructions: Apply Xeroform 5x9-HBD (in/in) as directed Secondary Dressing: Gauze 1 x Per Day/30 Days Secured With: Medipore T - 66M Medipore H Soft Cloth Surgical T ape ape, 2x2 (in/yd) 1 x Per Day/30 Days Electronic Signature(s) Signed: 03/23/2023 10:24:50 AM By: Yevonne Pax RN Signed: 03/23/2023 6:09:56 PM By: Leretha Pol, Tammy Hong F ([id number]) [id number] Page 3 of 5 Signed: 03/23/2023 6:09:56 PM By: Allen Derry PA-C Entered By: Yevonne Pax on 03/23/2023 10:24:49 -------------------------------------------------------------------------------- Problem List Details Patient Name: Date of Service: [name], Tammy Hong F. 03/19/2023 12:30 PM Medical Record Number: [id number] Patient Account Number: [id number] Date of Birth/Sex: Treating RN: Dec 14, 1945 (77 y.o. Freddy Finner Primary Care Provider: Marisue Ivan Other Clinician: Referring  Provider: Treating Provider/Extender: Maxwell Caul Weeks in Treatment: 4 Active Problems ICD-10 Encounter Code Description Active Date MDM Diagnosis M34.89 Other systemic sclerosis 02/19/2023 No Yes L98.498 Non-pressure chronic ulcer of skin of other sites with other specified severity 02/19/2023 No Yes I73.00 Raynaud's syndrome without gangrene 02/19/2023 No Yes I10 Essential (primary) hypertension 02/19/2023 No Yes Inactive Problems Resolved Problems Electronic Signature(s) Signed: 03/19/2023 12:35:33 PM By:  Allen Derry PA-C Entered By: Allen Derry on 03/19/2023 12:35:33 -------------------------------------------------------------------------------- Progress Note Details Patient Name: Date of Service: [name], [name] 03/19/2023 12:30 PM Medical Record Number: [id number] Patient Account Number: [id number] Date of Birth/Sex: Treating RN: October 19, 1946 (77 y.o. Freddy Finner Primary Care Provider: Marisue Ivan Other Clinician: Referring Provider: Treating Provider/Extender: Rebecka Apley in Treatment: 4 Subjective Chief Complaint Information obtained from Patient Left 4th finger ulcer History of Present Illness (HPI) 02-19-2023 upon evaluation today patient presents today for initial evaluation here in our clinic concerning issues she has been having with wounds on her hands that come intermittently. With that being said currently she has a wound which actually began she says around February 13, 2023. Initially showed up as a white bump that then became what she feels like was pus filled. 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 Nonetheless her primary care provider did put her on the standard treatment that he starts whenever this happens that is prednisone in a tapering Dosepak which she is now on the 2 pills a day for 3  days part of the regimen and then subsequently he also put her on doxycycline for 7 days total. With that being said there does appear to be some signs of potential release for infection and I do believe that this has been somewhat beneficial for her which is good news. I do not see any signs of active infection locally nor systemically at this time. The patient does have a history of scleroderma, Raynaud's syndrome, and hypertension. 02-27-2023 upon evaluation today patient appears to be doing well currently in regard to her finger ulcer. This actually looks to be much less inflamed compared to where we have been. Fortunately I do not see any signs of active infection locally nor systemically the wound looks better and smaller it also looks much [name], [name] ([id number]) [id number] Page 4 of 5 less irritated compared to where it was I see no signs of active infection at this point all of which is excellent news. 03-05-2023 upon evaluation today patient appears to be doing well currently in regard to her wound. This is actually showing signs of improvement which is great news and very pleased in that regard. Fortunately there does not appear to be any signs of infection locally nor systemically which is great news. 03-12-2023 upon evaluation today patient appears to be doing well currently in regard to her wound. She has been tolerating the dressing changes without complication. Fortunately there does not appear to be any signs of active infection locally nor systemically which is great news as well. No fevers, chills, nausea, vomiting, or diarrhea. 03-19-2023 upon evaluation today patient appears to be doing well currently in regard to her wound which actually showed signs of excellent improvement. Fortunately I do not see any evidence of infection this seems to be healing quite nicely and very pleased. Objective Constitutional Well-nourished and well-hydrated in no acute  distress. Vitals Time Taken: 12:41 PM, Height: 63 in, Weight: 125 lbs, BMI: 22.1, Temperature: 98 F, Pulse: 66 bpm, Respiratory Rate: 18 breaths/min, Blood Pressure: 124/72 mmHg. Respiratory normal breathing without difficulty. Psychiatric this patient is able to make decisions and demonstrates good insight into disease process. Alert and Oriented x 3. pleasant and cooperative. General Notes: Upon inspection patient's wound bed actually showed signs of good granulation  epithelization at this point. Fortunately I do not see any evidence of active infection locally nor systemically which is great and overall I am extremely pleased with where we stand. Integumentary (Hair, Skin) Wound #1 status is Open. Original cause of wound was Gradually Appeared. The date acquired was: 02/13/2023. The wound has been in treatment 4 weeks. The wound is located on the Left Hand - 4th Digit. The wound measures 1cm length x 1.5cm width x 0.2cm depth; 1.178cm^2 area and 0.236cm^3 volume. There is Fat Layer (Subcutaneous Tissue) exposed. There is no tunneling or undermining noted. There is a medium amount of serosanguineous drainage noted. There is medium (34-66%) granulation within the wound bed. There is a medium (34-66%) amount of necrotic tissue within the wound bed including Adherent Slough. Assessment Active Problems ICD-10 Other systemic sclerosis Non-pressure chronic ulcer of skin of other sites with other specified severity Raynaud's syndrome without gangrene Essential (primary) hypertension Plan 1. I am good recommend patient should continue to monitor for any signs of infection or worsening. Based on what I am seeing I do believe that we are on the right track and I do believe that she is tolerating the Xeroform gauze without any complication this is measuring significantly smaller we will continue as such. 2. I would recommend she continue to changes daily I think that still going to be the appropriate  way to go. We will see patient back for reevaluation in 1 week here in the clinic. If anything worsens or changes patient will contact our office for additional recommendations. Electronic Signature(s) Signed: 03/19/2023 12:54:03 PM By: Allen Derry PA-C Entered By: Allen Derry on 03/19/2023 12:54:03 -------------------------------------------------------------------------------- SuperBill Details Patient Name: Date of Service: [name], Tammy Hong F. 03/19/2023 Medical Record Number: [id number] Patient Account Number: [id number] [name], [name] ([id number]) [id number] Page 5 of 5 Date of Birth/Sex: Treating RN: 12/13/1945 (77 y.o. Freddy Finner Primary Care Provider: Marisue Ivan Other Clinician: Referring Provider: Treating Provider/Extender: Maxwell Caul Weeks in Treatment: 4 Diagnosis Coding ICD-10 Codes Code Description M34.89 Other systemic sclerosis L98.498 Non-pressure chronic ulcer of skin of other sites with other specified severity I73.00 Raynaud's syndrome without gangrene I10 Essential (primary) hypertension Facility Procedures : CPT4 Code: [id number] Description: [id number] - WOUND CARE VISIT-LEV 2 EST PT Modifier: Quantity: 1 Physician Procedures : CPT4 Code Description Modifier [id number] - WC PHYS LEVEL 3 - EST PT ICD-10 Diagnosis Description M34.89 Other systemic sclerosis L98.498 Non-pressure chronic ulcer of skin of other sites with other specified severity I73.00 Raynaud's syndrome  without gangrene I10 Essential (primary) hypertension Quantity: 1 Electronic Signature(s) Signed: 03/23/2023 10:29:49 AM By: Yevonne Pax RN Signed: 03/23/2023 6:09:56 PM By: Allen Derry PA-C Previous Signature: 03/19/2023 12:54:15 PM Version By: Allen Derry PA-C Entered By: Yevonne Pax on 03/23/2023 10:29:49

## 2023-03-26 ENCOUNTER — Encounter: Payer: Medicare HMO | Admitting: Physician Assistant

## 2023-03-26 DIAGNOSIS — L98498 Non-pressure chronic ulcer of skin of other sites with other specified severity: Secondary | ICD-10-CM | POA: Diagnosis not present

## 2023-03-26 DIAGNOSIS — I7300 Raynaud's syndrome without gangrene: Secondary | ICD-10-CM | POA: Diagnosis not present

## 2023-03-26 DIAGNOSIS — I10 Essential (primary) hypertension: Secondary | ICD-10-CM | POA: Diagnosis not present

## 2023-03-26 DIAGNOSIS — M349 Systemic sclerosis, unspecified: Secondary | ICD-10-CM | POA: Diagnosis not present

## 2023-03-26 NOTE — Progress Notes (Addendum)
LEMIA, LANFORD ([id number]) [id number] Page 1 of 7 Visit Report for 03/26/2023 Arrival Information Details Patient Name: Date of Service: [name], [name] 03/26/2023 12:30 PM Medical Record Number: [id number] Patient Account Number: [id number] Date of Birth/Sex: Treating RN: 05-20-1946 (77 y.o. Freddy Finner Primary Care Derric Dealmeida: Marisue Ivan Other Clinician: Referring Gildo Crisco: Treating Averlee Swartz/Extender: Rebecka Apley in Treatment: 5 Visit Information History Since Last Visit Added or deleted any medications: No Patient Arrived: Ambulatory Any new allergies or adverse reactions: No Arrival Time: 12:36 Had a fall or experienced change in No Accompanied By: self activities of daily living that may affect Transfer Assistance: None risk of falls: Patient Identification Verified: Yes Signs or symptoms of abuse/neglect since last visito No Secondary Verification Process Completed: Yes Hospitalized since last visit: No Patient Requires Transmission-Based Precautions: No Implantable device outside of the clinic excluding No Patient Has Alerts: Yes cellular tissue based products placed in the center Patient Alerts: PACEMAKER since last visit: Has Dressing in Place as Prescribed: Yes Pain Present Now: No Electronic Signature(s) Signed: 03/27/2023 9:26:52 AM By: Yevonne Pax RN Entered By: Yevonne Pax on 03/26/2023 12:38:46 -------------------------------------------------------------------------------- Clinic Level of Care Assessment Details Patient Name: Date of Service: [name], [name] 03/26/2023 12:30 PM Medical Record Number: [id number] Patient Account Number: [id number] Date of Birth/Sex: Treating RN: [birth year] (77 y.o. Freddy Finner Primary Care Kimbella Heisler: Marisue Ivan Other Clinician: Referring Hektor Huston: Treating Xitlaly Ault/Extender: Rebecka Apley in Treatment: 5 Clinic Level of Care Assessment  Items TOOL 4 Quantity Score X- 1 0 Use when only an EandM is performed on FOLLOW-UP visit ASSESSMENTS - Nursing Assessment / Reassessment X- 1 10 Reassessment of Co-morbidities (includes updates in patient status) X- 1 5 Reassessment of Adherence to Treatment Plan ASSESSMENTS - Wound and Skin A ssessment / Reassessment X - Simple Wound Assessment / Reassessment - one wound 1 5 []  - 0 Complex Wound Assessment / Reassessment - multiple wounds []  - 0 Dermatologic / Skin Assessment (not related to wound area) ASSESSMENTS - Focused Assessment []  - 0 Circumferential Edema Measurements - multi extremities []  - 0 Nutritional Assessment / Counseling / Intervention []  - 0 Lower Extremity Assessment (monofilament, tuning fork, pulses) []  - 0 Peripheral Arterial Disease Assessment (using hand held doppler) ASSESSMENTS - Ostomy and/or Continence Assessment and Care []  - 0 Incontinence Assessment and Management []  - 0 Ostomy Care Assessment and Management (repouching, etc.) PROCESS - Coordination of Care X - Simple Patient / Family Education for ongoing care 1 [address] [place] Simmons ([id number]) [id number] Page 2 of 7 []  - 0 Complex (extensive) Patient / Family Education for ongoing care []  - 0 Staff obtains Chiropractor, Records, T Results / Process Orders est []  - 0 Staff telephones HHA, Nursing Homes / Clarify orders / etc []  - 0 Routine Transfer to another Facility (non-emergent condition) []  - 0 Routine Hospital Admission (non-emergent condition) []  - 0 New Admissions / Manufacturing engineer / Ordering NPWT Apligraf, etc. , []  - 0 Emergency Hospital Admission (emergent condition) X- 1 10 Simple Discharge Coordination []  - 0 Complex (extensive) Discharge Coordination PROCESS - Special Needs []  - 0 Pediatric / Minor Patient Management []  - 0 Isolation Patient Management []  - 0 Hearing / Language / Visual special needs []  - 0 Assessment of Community  assistance (transportation, D/C planning, etc.) []  - 0 Additional assistance / Altered mentation []  - 0 Support Surface(s) Assessment (bed, cushion, seat, etc.) INTERVENTIONS - Wound Cleansing / Measurement X - Simple  Wound Cleansing - one wound 1 5 []  - 0 Complex Wound Cleansing - multiple wounds X- 1 5 Wound Imaging (photographs - any number of wounds) []  - 0 Wound Tracing (instead of photographs) X- 1 5 Simple Wound Measurement - one wound []  - 0 Complex Wound Measurement - multiple wounds INTERVENTIONS - Wound Dressings X - Small Wound Dressing one or multiple wounds 1 10 []  - 0 Medium Wound Dressing one or multiple wounds []  - 0 Large Wound Dressing one or multiple wounds []  - 0 Application of Medications - topical []  - 0 Application of Medications - injection INTERVENTIONS - Miscellaneous []  - 0 External ear exam []  - 0 Specimen Collection (cultures, biopsies, blood, body fluids, etc.) []  - 0 Specimen(s) / Culture(s) sent or taken to Lab for analysis []  - 0 Patient Transfer (multiple staff / Nurse, adult / Similar devices) []  - 0 Simple Staple / Suture removal (25 or less) []  - 0 Complex Staple / Suture removal (26 or more) []  - 0 Hypo / Hyperglycemic Management (close monitor of Blood Glucose) []  - 0 Ankle / Brachial Index (ABI) - do not check if billed separately X- 1 5 Vital Signs Has the patient been seen at the hospital within the last three years: Yes Total Score: 75 Level Of Care: New/Established - Level 2 Electronic Signature(s) Signed: 03/27/2023 9:26:52 AM By: Yevonne Pax RN Entered By: Yevonne Pax on 03/26/2023 12:47:39 [name] ([id number]) [id number] Page 3 of 7 -------------------------------------------------------------------------------- Encounter Discharge Information Details Patient Name: Date of Service: [name], [name] 03/26/2023 12:30 PM Medical Record Number: [id number] Patient Account Number:  [id number] Date of Birth/Sex: Treating RN: 1946/08/18 (77 y.o. Freddy Finner Primary Care Monte Zinni: Marisue Ivan Other Clinician: Referring Aliyanah Rozas: Treating Josealfredo Adkins/Extender: Rebecka Apley in Treatment: 5 Encounter Discharge Information Items Discharge Condition: Stable Ambulatory Status: Ambulatory Discharge Destination: Home Transportation: Private Auto Accompanied By: self Schedule Follow-up Appointment: Yes Clinical Summary of Care: Electronic Signature(s) Signed: 03/27/2023 9:26:52 AM By: Yevonne Pax RN Entered By: Yevonne Pax on 03/26/2023 12:48:13 -------------------------------------------------------------------------------- Lower Extremity Assessment Details Patient Name: Date of Service: [name], [name] 03/26/2023 12:30 PM Medical Record Number: [id number] Patient Account Number: [id number] Date of Birth/Sex: Treating RN: 08-13-1946 (77 y.o. Freddy Finner Primary Care Kristin Barcus: Marisue Ivan Other Clinician: Referring Rocky Gladden: Treating Harel Repetto/Extender: Maxwell Caul Weeks in Treatment: 5 Electronic Signature(s) Signed: 03/27/2023 9:26:52 AM By: Yevonne Pax RN Entered By: Yevonne Pax on 03/26/2023 12:42:31 -------------------------------------------------------------------------------- Multi Wound Chart Details Patient Name: Date of Service: Tammy Rushing Simmons. 03/26/2023 12:30 PM Medical Record Number: [id number] Patient Account Number: [id number] Date of Birth/Sex: Treating RN: 02/21/1946 (77 y.o. Freddy Finner Primary Care Da Michelle: Marisue Ivan Other Clinician: Referring Britanny Marksberry: Treating Arrianna Catala/Extender: Maxwell Caul Weeks in Treatment: 5 Vital Signs Height(in): 63 Pulse(bpm): 71 Weight(lbs): 125 Blood Pressure(mmHg): 118/73 Body Mass Index(BMI): 22.1 Temperature(Simmons): 98.4 Respiratory Rate(breaths/min): 18 [1:Photos:] [N/A:N/A] [name], [name] ([id number]) [1:Left Hand -  4th Digit Wound Location: Gradually Appeared Wounding Event: Scleroderma Primary Etiology: Hypertension, Raynauds, Comorbid History: Scleroderma 02/13/2023 Date Acquired: 5 Weeks of Treatment: Open Wound Status: No Wound Recurrence: Yes  Pending A mputation on Presentation: 1x1.1x0.1 Measurements L x W x D (cm) 0.864 A (cm) : rea 0.086 Volume (cm) : 89.00% % Reduction in A rea: 94.50% % Reduction in Volume: Full Thickness Without Exposed Classification: Support Structures Medium  Exudate Amount: Serosanguineous Exudate Type: red, brown Exudate Color: Large (67-100%) Granulation Amount: None Present (0%)  Necrotic Amount: Fat Layer (Subcutaneous Tissue): Yes N/A Exposed Structures: Fascia: No Tendon: No Muscle: No Joint: No Bone:  No None Epithelialization:] [N/A:N/A N/A N/A N/A N/A N/A N/A N/A N/A N/A N/A N/A N/A N/A N/A N/A N/A N/A N/A N/A N/A] Treatment Notes Electronic Signature(s) Signed: 03/27/2023 9:26:52 AM By: Yevonne Pax RN Entered By: Yevonne Pax on 03/26/2023 12:43:05 -------------------------------------------------------------------------------- Multi-Disciplinary Care Plan Details Patient Name: Date of Service: Tammy Rushing Simmons. 03/26/2023 12:30 PM Medical Record Number: [id number] Patient Account Number: [id number] Date of Birth/Sex: Treating RN: Mar 18, 1946 (77 y.o. Freddy Finner Primary Care Jemma Rasp: Marisue Ivan Other Clinician: Referring Brean Carberry: Treating Katerina Zurn/Extender: Maxwell Caul Weeks in Treatment: 5 Active Inactive Wound/Skin Impairment Nursing Diagnoses: Knowledge deficit related to ulceration/compromised skin integrity Goals: Patient/caregiver will verbalize understanding of skin care regimen Date Initiated: 02/19/2023 Target Resolution Date: 04/21/2023 Goal Status: Active Ulcer/skin breakdown will have a volume reduction of 30% by week 4 Date Initiated: 02/19/2023 Date Inactivated: 03/26/2023 Target Resolution Date: 03/21/2023 Goal Status:  Met Ulcer/skin breakdown will have a volume reduction of 50% by week 8 Date Initiated: 02/19/2023 Target Resolution Date: 04/21/2023 Goal Status: Active Ulcer/skin breakdown will have a volume reduction of 80% by week 12 Date Initiated: 02/19/2023 Target Resolution Date: 05/21/2023 Goal Status: Active Ulcer/skin breakdown will heal within 14 weeks Date Initiated: 02/19/2023 Target Resolution Date: 06/21/2023 Goal Status: Active Interventions: Assess patient/caregiver ability to obtain necessary supplies Assess patient/caregiver ability to perform ulcer/skin care regimen upon admission and as needed [name], [name] ([id number]) [id number] Page 5 of 7 Assess ulceration(s) every visit Notes: Electronic Signature(s) Signed: 03/27/2023 9:26:52 AM By: Yevonne Pax RN Entered By: Yevonne Pax on 03/26/2023 12:43:33 -------------------------------------------------------------------------------- Pain Assessment Details Patient Name: Date of Service: [name], [name] 03/26/2023 12:30 PM Medical Record Number: [id number] Patient Account Number: [id number] Date of Birth/Sex: Treating RN: [birth year] (77 y.o. Freddy Finner Primary Care Linnette Panella: Marisue Ivan Other Clinician: Referring Dell Hurtubise: Treating Vida Nicol/Extender: Maxwell Caul Weeks in Treatment: 5 Active Problems Location of Pain Severity and Description of Pain Patient Has Paino No Site Locations Pain Management and Medication Current Pain Management: Electronic Signature(s) Signed: 03/27/2023 9:26:52 AM By: Yevonne Pax RN Entered By: Yevonne Pax on 03/26/2023 12:39:11 -------------------------------------------------------------------------------- Patient/Caregiver Education Details Patient Name: Date of Service: [name] 5/9/2024andnbsp12:30 PM Medical Record Number: [id number] Patient Account Number: [id number] Date of Birth/Gender: Treating RN: 08-21-1946 (77 y.o. Freddy Finner Primary Care Physician: Marisue Ivan Other Clinician: Referring Physician: Treating Physician/Extender: Rebecka Apley in Treatment: 5 Education Assessment Education Provided To: Patient Education Topics Provided Wound/Skin Impairment: Handouts: Caring for Your Ulcer Methods: Explain/Verbal SHAAKIRA, MOHRMAN ([id number]) [id number] Page 6 of 7 Responses: State content correctly Electronic Signature(s) Signed: 03/27/2023 9:26:52 AM By: Yevonne Pax RN Entered By: Yevonne Pax on 03/26/2023 12:43:48 -------------------------------------------------------------------------------- Wound Assessment Details Patient Name: Date of Service: [name] 03/26/2023 12:30 PM Medical Record Number: [id number] Patient Account Number: [id number] Date of Birth/Sex: Treating RN: 10/26/1946 (77 y.o. Freddy Finner Primary Care Camden Knotek: Marisue Ivan Other Clinician: Referring Jamoni Broadfoot: Treating Akeisha Lagerquist/Extender: Maxwell Caul Weeks in Treatment: 5 Wound Status Wound Number: 1 Primary Etiology: Scleroderma Wound Location: Left Hand - 4th Digit Wound Status: Open Wounding Event: Gradually Appeared Comorbid History: Hypertension, Raynauds, Scleroderma Date Acquired: 02/13/2023 Weeks Of Treatment: 5 Clustered Wound: No Pending Amputation On Presentation Photos Wound Measurements Length: (cm) 1 Width: (cm) 1.1 Depth: (cm) 0.1 Area: (cm) 0.864 Volume: (cm) 0.086 % Reduction  in Area: 89% % Reduction in Volume: 94.5% Epithelialization: None Tunneling: No Undermining: No Wound Description Classification: Full Thickness Without Exposed Supp Exudate Amount: Medium Exudate Type: Serosanguineous Exudate Color: red, brown ort Structures Foul Odor After Cleansing: No Slough/Fibrino Yes Wound Bed Granulation Amount: Large (67-100%) Exposed Structure Necrotic Amount: None Present (0%) Fascia Exposed: No Fat  Layer (Subcutaneous Tissue) Exposed: Yes Tendon Exposed: No Muscle Exposed: No Joint Exposed: No Bone Exposed: No Treatment Notes Wound #1 (Hand - 4th Digit) Wound Laterality: Left Cleanser Soap and Water Discharge Instruction: Gently cleanse wound with antibacterial soap, rinse and pat dry prior to dressing wounds Wound Cleanser [name], [name] ([id number]) [id number] Page 7 of 7 Discharge Instruction: Wash your hands with soap and water. Remove old dressing, discard into plastic bag and place into trash. Cleanse the wound with Wound Cleanser prior to applying a clean dressing using gauze sponges, not tissues or cotton balls. Do not scrub or use excessive force. Pat dry using gauze sponges, not tissue or cotton balls. Peri-Wound Care Topical Primary Dressing Xeroform 5x9-HBD (in/in) Discharge Instruction: Apply Xeroform 5x9-HBD (in/in) as directed Secondary Dressing Gauze Secured With Medipore T - 41M Medipore H Soft Cloth Surgical T ape ape, 2x2 (in/yd) Compression Wrap Compression Stockings Add-Ons Electronic Signature(s) Signed: 03/27/2023 9:26:52 AM By: Yevonne Pax RN Entered By: Yevonne Pax on 03/26/2023 12:42:53 -------------------------------------------------------------------------------- Vitals Details Patient Name: Date of Service: [name], Tammy Hong Simmons. 03/26/2023 12:30 PM Medical Record Number: [id number] Patient Account Number: [id number] Date of Birth/Sex: Treating RN: [birth year] (77 y.o. Freddy Finner Primary Care Rondell Frick: Marisue Ivan Other Clinician: Referring Audi Conover: Treating Brayen Bunn/Extender: Rebecka Apley in Treatment: 5 Vital Signs Time Taken: 12:38 Temperature (Simmons): 98.4 Height (in): 63 Pulse (bpm): 71 Weight (lbs): 125 Respiratory Rate (breaths/min): 18 Body Mass Index (BMI): 22.1 Blood Pressure (mmHg): 118/73 Reference Range: 80 - 120 mg / dl Electronic Signature(s) Signed: 03/27/2023  9:26:52 AM By: Yevonne Pax RN Entered By: Yevonne Pax on 03/26/2023 12:39:05

## 2023-03-26 NOTE — Progress Notes (Addendum)
Tammy, Simmons ([id number]) [id number] Page 1 of 5 Visit Report for 03/26/2023 Chief Complaint Document Details Patient Name: Date of Service: Tammy, Simmons 03/26/2023 12:30 PM Medical Record Number: [id number] Patient Account Number: [id number] Date of Birth/Sex: Treating RN: [birth year] (77 y.o. Freddy Finner Primary Care Provider: Marisue Ivan Other Clinician: Referring Provider: Treating Provider/Extender: Rebecka Apley in Treatment: 5 Information Obtained from: Patient Chief Complaint Left 4th finger ulcer Electronic Signature(s) Signed: 03/26/2023 12:33:03 PM By: Allen Derry PA-C Entered By: Allen Derry on 03/26/2023 12:33:02 -------------------------------------------------------------------------------- HPI Details Patient Name: Date of Service: [name], Tammy Hong F. 03/26/2023 12:30 PM Medical Record Number: [id number] Patient Account Number: [id number] Date of Birth/Sex: Treating RN: 03-Mar-1946 (77 y.o. Freddy Finner Primary Care Provider: Marisue Ivan Other Clinician: Referring Provider: Treating Provider/Extender: Rebecka Apley in Treatment: 5 History of Present Illness HPI Description: 02-19-2023 upon evaluation today patient presents today for initial evaluation here in our clinic concerning issues she has been having with wounds on her hands that come intermittently. With that being said currently she has a wound which actually began she says around February 13, 2023. Initially showed up as a white bump that then became what she feels like was pus filled. 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 Nonetheless her primary care provider did put her on the standard treatment that he starts whenever this happens that is prednisone in a tapering Dosepak which she is now on the 2  pills a day for 3 days part of the regimen and then subsequently he also put her on doxycycline for 7 days total. With that being said there does appear to be some signs of potential release for infection and I do believe that this has been somewhat beneficial for her which is good news. I do not see any signs of active infection locally nor systemically at this time. The patient does have a history of scleroderma, Raynaud's syndrome, and hypertension. 02-27-2023 upon evaluation today patient appears to be doing well currently in regard to her finger ulcer. This actually looks to be much less inflamed compared to where we have been. Fortunately I do not see any signs of active infection locally nor systemically the wound looks better and smaller it also looks much less irritated compared to where it was I see no signs of active infection at this point all of which is excellent news. 03-05-2023 upon evaluation today patient appears to be doing well currently in regard to her wound. This is actually showing signs of improvement which is great news and very pleased in that regard. Fortunately there does not appear to be any signs of infection locally nor systemically which is great news. 03-26-2023 upon evaluation today patient appears to be doing well currently in regard to her wound. She has been tolerating the dressing changes without complication. Fortunately there does not appear to be any signs of active infection at this time. 03-12-2023 upon evaluation today patient appears to be doing well currently in regard to her wound. She has been tolerating the dressing changes without complication. Fortunately there does not appear to be any signs of active infection locally nor systemically which is great news as well. No fevers, chills, nausea, vomiting, or diarrhea. 03-19-2023 upon evaluation today patient appears to be doing well currently in regard to her wound  which actually showed signs of excellent  improvement. Fortunately I do not see any evidence of infection this seems to be healing quite nicely and very pleased. Electronic Signature(s) Signed: 03/26/2023 12:50:20 PM By: Allen Derry PA-C Entered By: Allen Derry on 03/26/2023 12:50:20 -------------------------------------------------------------------------------- Physical Exam Details Patient Name: Date of Service: [name], Tammy. 03/26/2023 12:30 PM LACHERYL, Simmons ([id number]) [id number] Page 2 of 5 Medical Record Number: [id number] Patient Account Number: [id number] Date of Birth/Sex: Treating RN: 1946/04/19 (77 y.o. Freddy Finner Primary Care Provider: Marisue Ivan Other Clinician: Referring Provider: Treating Provider/Extender: Maxwell Caul Weeks in Treatment: 5 Constitutional Well-nourished and well-hydrated in no acute distress. Respiratory normal breathing without difficulty. Psychiatric this patient is able to make decisions and demonstrates good insight into disease process. Alert and Oriented x 3. pleasant and cooperative. Notes Upon inspection patient's wound bed actually showed signs of good granulation epithelization at this point. Fortunately there does not appear to be any signs of active infection locally nor systemically which is great news. I am very pleased with where we stand. Electronic Signature(s) Signed: 03/26/2023 12:50:35 PM By: Allen Derry PA-C Entered By: Allen Derry on 03/26/2023 12:50:35 -------------------------------------------------------------------------------- Physician Orders Details Patient Name: Date of Service: Tammy Rushing F. 03/26/2023 12:30 PM Medical Record Number: [id number] Patient Account Number: [id number] Date of Birth/Sex: Treating RN: [birth year] (77 y.o. Freddy Finner Primary Care Provider: Marisue Ivan Other Clinician: Referring Provider: Treating Provider/Extender: Rebecka Apley in Treatment:  5 Verbal / Phone Orders: No Diagnosis Coding ICD-10 Coding Code Description M34.89 Other systemic sclerosis L98.498 Non-pressure chronic ulcer of skin of other sites with other specified severity I73.00 Raynaud's syndrome without gangrene I10 Essential (primary) hypertension Follow-up Appointments Return Appointment in 1 week. Bathing/ Shower/ Hygiene May shower; gently cleanse wound with antibacterial soap, rinse and pat dry prior to dressing wounds Anesthetic (Use 'Patient Medications' Section for Anesthetic Order Entry) Lidocaine applied to wound bed Wound Treatment Wound #1 - Hand - 4th Digit Wound Laterality: Left Cleanser: Soap and Water 1 x Per Day/30 Days Discharge Instructions: Gently cleanse wound with antibacterial soap, rinse and pat dry prior to dressing wounds Cleanser: Wound Cleanser 1 x Per Day/30 Days Discharge Instructions: Wash your hands with soap and water. Remove old dressing, discard into plastic bag and place into trash. Cleanse the wound with Wound Cleanser prior to applying a clean dressing using gauze sponges, not tissues or cotton balls. Do not scrub or use excessive force. Pat dry using gauze sponges, not tissue or cotton balls. Prim Dressing: Xeroform 5x9-HBD (in/in) 1 x Per Day/30 Days ary Discharge Instructions: Apply Xeroform 5x9-HBD (in/in) as directed Secondary Dressing: Gauze 1 x Per Day/30 Days Secured With: Medipore T - 10M Medipore H Soft Cloth Surgical T ape ape, 2x2 (in/yd) 1 x Per Day/30 Days TIKEISHA, BRANDSMA ([id number]) [id number] Page 3 of 5 Electronic Signature(s) Signed: 03/26/2023 4:04:13 PM By: Allen Derry PA-C Signed: 03/27/2023 9:26:52 AM By: Yevonne Pax RN Entered By: Yevonne Pax on 03/26/2023 12:46:59 -------------------------------------------------------------------------------- Problem List Details Patient Name: Date of Service: [name], Tammy Hong F. 03/26/2023 12:30 PM Medical Record Number:  [id number] Patient Account Number: [id number] Date of Birth/Sex: Treating RN: 30-Nov-1945 (77 y.o. Freddy Finner Primary Care Provider: Marisue Ivan Other Clinician: Referring Provider: Treating Provider/Extender: Maxwell Caul Weeks in Treatment: 5 Active Problems ICD-10 Encounter Code Description Active Date MDM Diagnosis M34.89 Other systemic sclerosis 02/19/2023 No Yes L98.498 Non-pressure chronic ulcer of skin of other sites  with other specified severity 02/19/2023 No Yes I73.00 Raynaud's syndrome without gangrene 02/19/2023 No Yes I10 Essential (primary) hypertension 02/19/2023 No Yes Inactive Problems Resolved Problems Electronic Signature(s) Signed: 03/26/2023 12:33:00 PM By: Allen Derry PA-C Entered By: Allen Derry on 03/26/2023 12:33:00 -------------------------------------------------------------------------------- Progress Note Details Patient Name: Date of Service: Tammy Rushing F. 03/26/2023 12:30 PM Medical Record Number: [id number] Patient Account Number: [id number] Date of Birth/Sex: Treating RN: [birth year] (77 y.o. Freddy Finner Primary Care Provider: Marisue Ivan Other Clinician: Referring Provider: Treating Provider/Extender: Rebecka Apley in Treatment: 5 Subjective Chief Complaint Information obtained from Patient Left 4th finger ulcer History of Present Illness (HPI) 02-19-2023 upon evaluation today patient presents today for initial evaluation here in our clinic concerning issues she has been having with wounds on her hands that come intermittently. With that being said currently she has a wound which actually began she says around February 13, 2023. Initially showed up as a white bump that then became what she feels like was pus filled. 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 Nonetheless her  primary care provider did put her on the standard treatment that he starts whenever this happens that is prednisone in a tapering Dosepak which she is now on the 2 pills a day for 3 days part of the regimen and then subsequently he also put her on doxycycline for 7 days total. With that being said there does appear to be some signs of potential release for infection and I do believe that this has been somewhat beneficial for her which is good news. I do not see any signs of active infection locally nor systemically at this time. JAKELYNE, GATELY ([id number]) [id number] Page 4 of 5 The patient does have a history of scleroderma, Raynaud's syndrome, and hypertension. 02-27-2023 upon evaluation today patient appears to be doing well currently in regard to her finger ulcer. This actually looks to be much less inflamed compared to where we have been. Fortunately I do not see any signs of active infection locally nor systemically the wound looks better and smaller it also looks much less irritated compared to where it was I see no signs of active infection at this point all of which is excellent news. 03-05-2023 upon evaluation today patient appears to be doing well currently in regard to her wound. This is actually showing signs of improvement which is great news and very pleased in that regard. Fortunately there does not appear to be any signs of infection locally nor systemically which is great news. 03-26-2023 upon evaluation today patient appears to be doing well currently in regard to her wound. She has been tolerating the dressing changes without complication. Fortunately there does not appear to be any signs of active infection at this time. 03-12-2023 upon evaluation today patient appears to be doing well currently in regard to her wound. She has been tolerating the dressing changes without complication. Fortunately there does not appear to be any signs of active infection  locally nor systemically which is great news as well. No fevers, chills, nausea, vomiting, or diarrhea. 03-19-2023 upon evaluation today patient appears to be doing well currently in regard to her wound which actually showed signs of excellent improvement. Fortunately I do not see any evidence of infection this seems to be healing quite nicely and very pleased. Objective Constitutional Well-nourished and  well-hydrated in no acute distress. Vitals Time Taken: 12:38 PM, Height: 63 in, Weight: 125 lbs, BMI: 22.1, Temperature: 98.4 F, Pulse: 71 bpm, Respiratory Rate: 18 breaths/min, Blood Pressure: 118/73 mmHg. Respiratory normal breathing without difficulty. Psychiatric this patient is able to make decisions and demonstrates good insight into disease process. Alert and Oriented x 3. pleasant and cooperative. General Notes: Upon inspection patient's wound bed actually showed signs of good granulation epithelization at this point. Fortunately there does not appear to be any signs of active infection locally nor systemically which is great news. I am very pleased with where we stand. Integumentary (Hair, Skin) Wound #1 status is Open. Original cause of wound was Gradually Appeared. The date acquired was: 02/13/2023. The wound has been in treatment 5 weeks. The wound is located on the Left Hand - 4th Digit. The wound measures 1cm length x 1.1cm width x 0.1cm depth; 0.864cm^2 area and 0.086cm^3 volume. There is Fat Layer (Subcutaneous Tissue) exposed. There is no tunneling or undermining noted. There is a medium amount of serosanguineous drainage noted. There is large (67-100%) granulation within the wound bed. There is no necrotic tissue within the wound bed. Assessment Active Problems ICD-10 Other systemic sclerosis Non-pressure chronic ulcer of skin of other sites with other specified severity Raynaud's syndrome without gangrene Essential (primary) hypertension Plan Follow-up  Appointments: Return Appointment in 1 week. Bathing/ Shower/ Hygiene: May shower; gently cleanse wound with antibacterial soap, rinse and pat dry prior to dressing wounds Anesthetic (Use 'Patient Medications' Section for Anesthetic Order Entry): Lidocaine applied to wound bed WOUND #1: - Hand - 4th Digit Wound Laterality: Left Cleanser: Soap and Water 1 x Per Day/30 Days Discharge Instructions: Gently cleanse wound with antibacterial soap, rinse and pat dry prior to dressing wounds Cleanser: Wound Cleanser 1 x Per Day/30 Days Discharge Instructions: Wash your hands with soap and water. Remove old dressing, discard into plastic bag and place into trash. Cleanse the wound with Wound Cleanser prior to applying a clean dressing using gauze sponges, not tissues or cotton balls. Do not scrub or use excessive force. Pat dry using gauze sponges, not tissue or cotton balls. Prim Dressing: Xeroform 5x9-HBD (in/in) 1 x Per Day/30 Days ary Discharge Instructions: Apply Xeroform 5x9-HBD (in/in) as directed Secondary Dressing: Gauze 1 x Per Day/30 Days DETRICK, DINUCCI ([id number]) [id number] Page 5 of 5 Secured With: Medipore T - 62M Medipore H Soft Cloth Surgical T ape ape, 2x2 (in/yd) 1 x Per Day/30 Days 1. I am recommend that we have the patient continue to monitor for any signs of infection or worsening. Based on what I am seeing I do believe that removing in the right direction. 2. I am also can recommend the patient should continue with the Xeroform gauze dressing which I think is doing a really good job. 3. I am also can recommend that the patient should continue to keep this clean and the dressings reapplied as needed she is doing an awesome job taking care of this. We will see patient back for reevaluation in 1 week here in the clinic. If anything worsens or changes patient will contact our office for additional recommendations. Electronic Signature(s) Signed: 03/26/2023  12:51:02 PM By: Allen Derry PA-C Entered By: Allen Derry on 03/26/2023 12:51:02 -------------------------------------------------------------------------------- SuperBill Details Patient Name: Date of Service: [name], Tammy Hong F. 03/26/2023 Medical Record Number: [id number] Patient Account Number: [id number] Date of Birth/Sex: Treating RN: [birth year] (77 y.o. Freddy Finner Primary Care Provider: Marisue Ivan Other Clinician: Referring Provider:  Treating Provider/Extender: Maxwell Caul Weeks in Treatment: 5 Diagnosis Coding ICD-10 Codes Code Description M34.89 Other systemic sclerosis L98.498 Non-pressure chronic ulcer of skin of other sites with other specified severity I73.00 Raynaud's syndrome without gangrene I10 Essential (primary) hypertension Facility Procedures : CPT4 Code: [id number] Description: [id number] - WOUND CARE VISIT-LEV 2 EST PT Modifier: Quantity: 1 Physician Procedures : CPT4 Code Description Modifier [id number] - WC PHYS LEVEL 3 - EST PT ICD-10 Diagnosis Description M34.89 Other systemic sclerosis L98.498 Non-pressure chronic ulcer of skin of other sites with other specified severity I73.00 Raynaud's syndrome  without gangrene I10 Essential (primary) hypertension Quantity: 1 Electronic Signature(s) Signed: 03/26/2023 12:52:09 PM By: Allen Derry PA-C Entered By: Allen Derry on 03/26/2023 12:52:09

## 2023-03-31 DIAGNOSIS — B351 Tinea unguium: Secondary | ICD-10-CM | POA: Diagnosis not present

## 2023-03-31 DIAGNOSIS — M79674 Pain in right toe(s): Secondary | ICD-10-CM | POA: Diagnosis not present

## 2023-03-31 DIAGNOSIS — M79675 Pain in left toe(s): Secondary | ICD-10-CM | POA: Diagnosis not present

## 2023-04-02 ENCOUNTER — Encounter: Payer: Medicare HMO | Admitting: Physician Assistant

## 2023-04-02 DIAGNOSIS — I10 Essential (primary) hypertension: Secondary | ICD-10-CM | POA: Diagnosis not present

## 2023-04-02 DIAGNOSIS — I7300 Raynaud's syndrome without gangrene: Secondary | ICD-10-CM | POA: Diagnosis not present

## 2023-04-02 DIAGNOSIS — L98498 Non-pressure chronic ulcer of skin of other sites with other specified severity: Secondary | ICD-10-CM | POA: Diagnosis not present

## 2023-04-02 DIAGNOSIS — M349 Systemic sclerosis, unspecified: Secondary | ICD-10-CM | POA: Diagnosis not present

## 2023-04-08 DIAGNOSIS — M349 Systemic sclerosis, unspecified: Secondary | ICD-10-CM | POA: Diagnosis not present

## 2023-04-09 ENCOUNTER — Encounter: Payer: Medicare HMO | Admitting: Physician Assistant

## 2023-04-09 DIAGNOSIS — M349 Systemic sclerosis, unspecified: Secondary | ICD-10-CM | POA: Diagnosis not present

## 2023-04-09 DIAGNOSIS — M3489 Other systemic sclerosis: Secondary | ICD-10-CM | POA: Diagnosis not present

## 2023-04-09 DIAGNOSIS — L98498 Non-pressure chronic ulcer of skin of other sites with other specified severity: Secondary | ICD-10-CM | POA: Diagnosis not present

## 2023-04-09 DIAGNOSIS — I10 Essential (primary) hypertension: Secondary | ICD-10-CM | POA: Diagnosis not present

## 2023-04-09 DIAGNOSIS — I7300 Raynaud's syndrome without gangrene: Secondary | ICD-10-CM | POA: Diagnosis not present

## 2023-04-16 ENCOUNTER — Encounter: Payer: Medicare HMO | Admitting: Physician Assistant

## 2023-04-16 DIAGNOSIS — I10 Essential (primary) hypertension: Secondary | ICD-10-CM | POA: Diagnosis not present

## 2023-04-16 DIAGNOSIS — I7300 Raynaud's syndrome without gangrene: Secondary | ICD-10-CM | POA: Diagnosis not present

## 2023-04-16 DIAGNOSIS — M349 Systemic sclerosis, unspecified: Secondary | ICD-10-CM | POA: Diagnosis not present

## 2023-04-16 DIAGNOSIS — L98498 Non-pressure chronic ulcer of skin of other sites with other specified severity: Secondary | ICD-10-CM | POA: Diagnosis not present

## 2023-04-16 DIAGNOSIS — L940 Localized scleroderma [morphea]: Secondary | ICD-10-CM | POA: Diagnosis not present

## 2023-04-23 ENCOUNTER — Encounter: Payer: Medicare HMO | Attending: Physician Assistant | Admitting: Physician Assistant

## 2023-04-23 DIAGNOSIS — I10 Essential (primary) hypertension: Secondary | ICD-10-CM | POA: Diagnosis not present

## 2023-04-23 DIAGNOSIS — L98498 Non-pressure chronic ulcer of skin of other sites with other specified severity: Secondary | ICD-10-CM | POA: Diagnosis not present

## 2023-04-23 DIAGNOSIS — M3489 Other systemic sclerosis: Secondary | ICD-10-CM | POA: Diagnosis not present

## 2023-04-23 DIAGNOSIS — I7300 Raynaud's syndrome without gangrene: Secondary | ICD-10-CM | POA: Insufficient documentation

## 2023-04-23 DIAGNOSIS — M349 Systemic sclerosis, unspecified: Secondary | ICD-10-CM | POA: Insufficient documentation

## 2023-04-24 DIAGNOSIS — M349 Systemic sclerosis, unspecified: Secondary | ICD-10-CM | POA: Diagnosis not present

## 2023-04-24 DIAGNOSIS — Z7960 Long term (current) use of unspecified immunomodulators and immunosuppressants: Secondary | ICD-10-CM | POA: Diagnosis not present

## 2023-04-30 ENCOUNTER — Encounter: Payer: Medicare HMO | Admitting: Physician Assistant

## 2023-04-30 DIAGNOSIS — M3489 Other systemic sclerosis: Secondary | ICD-10-CM | POA: Diagnosis not present

## 2023-04-30 DIAGNOSIS — I10 Essential (primary) hypertension: Secondary | ICD-10-CM | POA: Diagnosis not present

## 2023-04-30 DIAGNOSIS — M349 Systemic sclerosis, unspecified: Secondary | ICD-10-CM | POA: Diagnosis not present

## 2023-04-30 DIAGNOSIS — I7300 Raynaud's syndrome without gangrene: Secondary | ICD-10-CM | POA: Diagnosis not present

## 2023-04-30 DIAGNOSIS — L98498 Non-pressure chronic ulcer of skin of other sites with other specified severity: Secondary | ICD-10-CM | POA: Diagnosis not present

## 2023-05-01 ENCOUNTER — Telehealth: Payer: Self-pay | Admitting: Dermatology

## 2023-05-01 NOTE — Progress Notes (Signed)
DDNNA, KOHR ([id number]) [id number] Page 1 of 6 Visit Report for 04/30/2023 Chief Complaint Document Details Patient Name: Date of Service: [name], [name] 04/30/2023 12:30 PM Medical Record Number: [id number] Patient Account Number: [id number] Date of Birth/Sex: Treating RN: Dec 30, 1945 (77 y.o. Freddy Finner Primary Care Provider: Marisue Ivan Other Clinician: Referring Provider: Treating Provider/Extender: Rebecka Apley in Treatment: 10 Information Obtained from: Patient Chief Complaint Left 4th finger ulcer Electronic Signature(s) Signed: 04/30/2023 12:47:30 PM By: Allen Derry PA-C Entered By: Allen Derry on 04/30/2023 12:47:30 -------------------------------------------------------------------------------- HPI Details Patient Name: Date of Service: [name], Tammy Hong F. 04/30/2023 12:30 PM Medical Record Number: [id number] Patient Account Number: [id number] Date of Birth/Sex: Treating RN: [birth year] (77 y.o. Freddy Finner Primary Care Provider: Marisue Ivan Other Clinician: Referring Provider: Treating Provider/Extender: Rebecka Apley in Treatment: 10 History of Present Illness HPI Description: 02-19-2023 upon evaluation today patient presents today for initial evaluation here in our clinic concerning issues she has been having with wounds on her hands that come intermittently. With that being said currently she has a wound which actually began she says around February 13, 2023. Initially showed up as a white bump that then became what she feels like was pus filled. 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 Nonetheless her primary care provider did put her on the standard treatment that he starts whenever this happens that is prednisone in a tapering Dosepak which she is now on  the 2 pills a day for 3 days part of the regimen and then subsequently he also put her on doxycycline for 7 days total. With that being said there does appear to be some signs of potential release for infection and I do believe that this has been somewhat beneficial for her which is good news. I do not see any signs of active infection locally nor systemically at this time. The patient does have a history of scleroderma, Raynaud's syndrome, and hypertension. 02-27-2023 upon evaluation today patient appears to be doing well currently in regard to her finger ulcer. This actually looks to be much less inflamed compared to where we have been. Fortunately I do not see any signs of active infection locally nor systemically the wound looks better and smaller it also looks much less irritated compared to where it was I see no signs of active infection at this point all of which is excellent news. 03-05-2023 upon evaluation today patient appears to be doing well currently in regard to her wound. This is actually showing signs of improvement which is great news and very pleased in that regard. Fortunately there does not appear to be any signs of infection locally nor systemically which is great news. ILCE, HAVINS ([id number]) [id number] Page 2 of 6 03-26-2023 upon evaluation today patient appears to be doing well currently in regard to her wound. She has been tolerating the dressing changes without complication. Fortunately there does not appear to be any signs of active infection at this time. 03-12-2023 upon evaluation today patient appears to be doing well currently in regard to her wound. She has been tolerating the dressing changes without complication. Fortunately there does not appear to be any signs of active infection locally nor systemically which is great news as well. No fevers, chills, nausea, vomiting, or diarrhea. 03-19-2023 upon evaluation today patient appears to  be doing  well currently in regard to her wound which actually showed signs of excellent improvement. Fortunately I do not see any evidence of infection this seems to be healing quite nicely and very pleased. 04-02-2023 upon evaluation today patient appears to be doing well currently in regard to her wound. She has been tolerating dressing changes without complication and in general I am very pleased with where we stand today. I do not see any signs of active infection locally nor systemically at this point. 04-09-2023 upon evaluation today patient's wound does have some dry skin around the edges of the wound and some necrotic tissue in the center part of the wound. I am can actually perform some sharp debridement to clear this away today and she is in agreement with the plan. Fortunately I do not see any signs of active infection locally nor systemically at this time which is great news. No fevers, chills, nausea, vomiting, or diarrhea. 04-16-2023 upon evaluation today patient actually appears to be doing excellent in regard to her wounds. She has been tolerating dressing changes without complication. Fortunately there does not appear to be any signs of active infection locally or systemically which is great news. 04-23-2023 upon evaluation today patient appears to be doing excellent. In fact she seems to almost be completely healed which is great news. With that being said there is 1 little spot where there was a scab that when popped off has a small opening underneath there is no need for sharp debridement but she seems to be doing really well today. 04-30-2023 upon evaluation patient actually appears to be completely healed which is great news. Fortunately I do not see any signs of infection or any open wound and overall she is doing extremely well as far as I am concerned. I would recommend that she is ready for discharge today. Electronic Signature(s) Signed: 04/30/2023 5:20:28 PM By: Allen Derry PA-C Entered  By: Allen Derry on 04/30/2023 17:20:28 -------------------------------------------------------------------------------- Physical Exam Details Patient Name: Date of Service: [name] F. 04/30/2023 12:30 PM Medical Record Number: [id number] Patient Account Number: [id number] Date of Birth/Sex: Treating RN: [birth year] (77 y.o. Freddy Finner Primary Care Provider: Marisue Ivan Other Clinician: Referring Provider: Treating Provider/Extender: Maxwell Caul Weeks in Treatment: 10 Constitutional Well-nourished and well-hydrated in no acute distress. Respiratory normal breathing without difficulty. Psychiatric this patient is able to make decisions and demonstrates good insight into disease process. Alert and Oriented x 3. pleasant and cooperative. Notes Upon inspection patient's wound bed actually showed signs of complete epithelization and seems to be doing quite well and extremely pleased with where we stand at this point. I do not see any signs of active infection at this time. Electronic Signature(s) Signed: 04/30/2023 5:21:00 PM By: Allen Derry PA-C Entered By: Allen Derry on 04/30/2023 17:21:00 Marijean Niemann F ([id number]) [id number] Page 3 of 6 -------------------------------------------------------------------------------- Physician Orders Details Patient Name: Date of Service: JEZELL, SHA 04/30/2023 12:30 PM Medical Record Number: [id number] Patient Account Number: [id number] Date of Birth/Sex: Treating RN: [birth year] (77 y.o. Freddy Finner Primary Care Provider: Marisue Ivan Other Clinician: Referring Provider: Treating Provider/Extender: Rebecka Apley in Treatment: 10 Verbal / Phone Orders: No Diagnosis Coding ICD-10 Coding Code Description M34.89 Other systemic sclerosis L98.498 Non-pressure chronic ulcer of skin of other sites with other specified severity I73.00 Raynaud's syndrome  without gangrene I10 Essential (primary) hypertension Discharge From [facility] Services Discharge from Wound Care Center Treatment Complete Electronic Signature(s) Signed: 04/30/2023  4:13:25 PM By: Yevonne Pax RN Signed: 04/30/2023 5:43:48 PM By: Allen Derry PA-C Entered By: Yevonne Pax on 04/30/2023 12:53:06 -------------------------------------------------------------------------------- Problem List Details Patient Name: Date of Service: [name], Tammy Hong F. 04/30/2023 12:30 PM Medical Record Number: [id number] Patient Account Number: [id number] Date of Birth/Sex: Treating RN: [birth year] (77 y.o. Freddy Finner Primary Care Provider: Marisue Ivan Other Clinician: Referring Provider: Treating Provider/Extender: Maxwell Caul Weeks in Treatment: 10 Active Problems ICD-10 Encounter Code Description Active Date MDM Diagnosis M34.89 Other systemic sclerosis 02/19/2023 No Yes L98.498 Non-pressure chronic ulcer of skin of other sites with other specified severity 02/19/2023 No Yes KERRI-ANN, WILDES ([id number]) [id number] Page 4 of 6 I73.00 Raynaud's syndrome without gangrene 02/19/2023 No Yes I10 Essential (primary) hypertension 02/19/2023 No Yes Inactive Problems Resolved Problems Electronic Signature(s) Signed: 04/30/2023 12:43:35 PM By: Allen Derry PA-C Entered By: Allen Derry on 04/30/2023 12:43:35 -------------------------------------------------------------------------------- Progress Note Details Patient Name: Date of Service: [name] F. 04/30/2023 12:30 PM Medical Record Number: [id number] Patient Account Number: [id number] Date of Birth/Sex: Treating RN: 07/31/1946 (77 y.o. Freddy Finner Primary Care Provider: Marisue Ivan Other Clinician: Referring Provider: Treating Provider/Extender: Maxwell Caul Weeks in Treatment: 10 Subjective Chief Complaint Information obtained from Patient Left 4th finger  ulcer History of Present Illness (HPI) 02-19-2023 upon evaluation today patient presents today for initial evaluation here in our clinic concerning issues she has been having with wounds on her hands that come intermittently. With that being said currently she has a wound which actually began she says around February 13, 2023. Initially showed up as a white bump that then became what she feels like was pus filled. 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 Nonetheless her primary care provider did put her on the standard treatment that he starts whenever this happens that is prednisone in a tapering Dosepak which she is now on the 2 pills a day for 3 days part of the regimen and then subsequently he also put her on doxycycline for 7 days total. With that being said there does appear to be some signs of potential release for infection and I do believe that this has been somewhat beneficial for her which is good news. I do not see any signs of active infection locally nor systemically at this time. The patient does have a history of scleroderma, Raynaud's syndrome, and hypertension. 02-27-2023 upon evaluation today patient appears to be doing well currently in regard to her finger ulcer. This actually looks to be much less inflamed compared to where we have been. Fortunately I do not see any signs of active infection locally nor systemically the wound looks better and smaller it also looks much less irritated compared to where it was I see no signs of active infection at this point all of which is excellent news. 03-05-2023 upon evaluation today patient appears to be doing well currently in regard to her wound. This is actually showing signs of improvement which is great news and very pleased in that regard. Fortunately there does not appear to be any signs of infection locally nor  systemically which is great news. 03-26-2023 upon evaluation today patient appears to be doing well currently in regard to her wound. She has been tolerating the dressing changes without complication. Fortunately there does not appear to be any signs of active infection at  this time. 03-12-2023 upon evaluation today patient appears to be doing well currently in regard to her wound. She has been tolerating the dressing changes without complication. Fortunately there does not appear to be any signs of active infection locally nor systemically which is great news as well. No fevers, chills, nausea, vomiting, or diarrhea. 03-19-2023 upon evaluation today patient appears to be doing well currently in regard to her wound which actually showed signs of excellent improvement. Fortunately I do not see any evidence of infection this seems to be healing quite nicely and very pleased. 04-02-2023 upon evaluation today patient appears to be doing well currently in regard to her wound. She has been tolerating dressing changes without complication and in general I am very pleased with where we stand today. I do not see any signs of active infection locally nor systemically at this point. 04-09-2023 upon evaluation today patient's wound does have some dry skin around the edges of the wound and some necrotic tissue in the center part of the WILLADENE, KIMMER ([id number]) [id number] Page 5 of 6 wound. I am can actually perform some sharp debridement to clear this away today and she is in agreement with the plan. Fortunately I do not see any signs of active infection locally nor systemically at this time which is great news. No fevers, chills, nausea, vomiting, or diarrhea. 04-16-2023 upon evaluation today patient actually appears to be doing excellent in regard to her wounds. She has been tolerating dressing changes without complication. Fortunately there does not appear to be any signs of active  infection locally or systemically which is great news. 04-23-2023 upon evaluation today patient appears to be doing excellent. In fact she seems to almost be completely healed which is great news. With that being said there is 1 little spot where there was a scab that when popped off has a small opening underneath there is no need for sharp debridement but she seems to be doing really well today. 04-30-2023 upon evaluation patient actually appears to be completely healed which is great news. Fortunately I do not see any signs of infection or any open wound and overall she is doing extremely well as far as I am concerned. I would recommend that she is ready for discharge today. Objective Constitutional Well-nourished and well-hydrated in no acute distress. Vitals Time Taken: 12:35 PM, Height: 63 in, Weight: 125 lbs, BMI: 22.1, Temperature: 98 F, Pulse: 81 bpm, Respiratory Rate: 16 breaths/min, Blood Pressure: 127/68 mmHg. Respiratory normal breathing without difficulty. Psychiatric this patient is able to make decisions and demonstrates good insight into disease process. Alert and Oriented x 3. pleasant and cooperative. General Notes: Upon inspection patient's wound bed actually showed signs of complete epithelization and seems to be doing quite well and extremely pleased with where we stand at this point. I do not see any signs of active infection at this time. Integumentary (Hair, Skin) Wound #1 status is Healed - Epithelialized. Original cause of wound was Gradually Appeared. The date acquired was: 02/13/2023. The wound has been in treatment 10 weeks. The wound is located on the Left Hand - 4th Digit. The wound measures 0cm length x 0cm width x 0cm depth; 0cm^2 area and 0cm^3 volume. There is no tunneling or undermining noted. There is a none present amount of drainage noted. There is no granulation within the wound bed. There is no necrotic tissue within the wound bed. Assessment Active  Problems ICD-10 Other systemic sclerosis Non-pressure chronic ulcer of skin of other  sites with other specified severity Raynaud's syndrome without gangrene Essential (primary) hypertension Plan [facility]charge From [facility] Services: Discharge from Wound Care Center Treatment Complete 1. I would recommend that we going discontinue wound care services as the patient appears to be completely healed this is great news. 2. I am also can recommend that she should continue to monitor for any signs of infection or worsening. Obviously if anything changes she knows to contact the office and let me know. We will see the patient back for follow-up visit as needed. Electronic Signature(s) Signed: 04/30/2023 5:21:23 PM By: Allen Derry PA-C Entered By: Allen Derry on 04/30/2023 17:21:23 Despina Hick ([id number]) [id number] Page 6 of 6 -------------------------------------------------------------------------------- SuperBill Details Patient Name: Date of Service: TANNAZ, ECKARD 04/30/2023 Medical Record Number: [id number] Patient Account Number: [id number] Date of Birth/Sex: Treating RN: 1946-01-06 (77 y.o. Freddy Finner Primary Care Provider: Marisue Ivan Other Clinician: Referring Provider: Treating Provider/Extender: Maxwell Caul Weeks in Treatment: 10 Diagnosis Coding ICD-10 Codes Code Description M34.89 Other systemic sclerosis L98.498 Non-pressure chronic ulcer of skin of other sites with other specified severity I73.00 Raynaud's syndrome without gangrene I10 Essential (primary) hypertension Facility Procedures : CPT4 Code: [id number] Description: [id number] - WOUND CARE VISIT-LEV 2 EST PT Modifier: Quantity: 1 Physician Procedures : CPT4 Code Description Modifier [id number] - WC PHYS LEVEL 3 - EST PT ICD-10 Diagnosis Description M34.89 Other systemic sclerosis L98.498 Non-pressure chronic ulcer of skin of other sites with other specified  severity I73.00 Raynaud's syndrome  without gangrene I10 Essential (primary) hypertension Quantity: 1 Electronic Signature(s) Signed: 04/30/2023 5:22:57 PM By: Allen Derry PA-C Previous Signature: 04/30/2023 4:13:25 PM Version By: Yevonne Pax RN Entered By: Allen Derry on 04/30/2023 17:22:57

## 2023-05-01 NOTE — Telephone Encounter (Addendum)
Please send a certified letter. Thank you!     Dear [name],  I hope this letter finds you well.   It has been my sincere pleasure to be a part of your healthcare team. 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   Since I will be unable to follow-up with you in future, I am writing to stress the importance of keeping your Mohs surgery appointment. When treated, skin cancer typically has very good outcomes. However, if skin cancer is left untreated it can pose serious health risks including destroying nearby tissue or even death.    If for any reason you are unable to go to your appointment or if you do not have your appointment scheduled, please reach out to [facility] Mohs surgery at [phone].   You can also reach out to our office at [phone] option 4 or through MyChart if you need assistance.   If you have any concerns about your treatment and wish to discuss other treatment options, please reach out to our office as well. You can reach [place] by phone at [phone] option 4 or through [employer].    As I am transitioning from the practice, I regret that I will not be able to continue with your care personally. However, I am confident you are in capable hands with my colleagues and the team at [facility]    Thank you for entrusting me with your skin health. 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 Please do not hesitate to reach out if you have any questions or concerns.   Wishing you continued health and well-being.   Sincerely,   Tammy Mealy, MD, MPH Dermatologist [facility] [phone] option 4

## 2023-05-02 NOTE — Progress Notes (Signed)
AYLLA, BRAMEL ([id number]) [id number] Page 1 of 8 Visit Report for 04/30/2023 Arrival Information Details Patient Name: Date of Service: MADYSN, FARACE 04/30/2023 12:30 PM Medical Record Number: [id number] Patient Account Number: [id number] Date of Birth/Sex: Treating RN: 12-02-1945 (77 y.o. Freddy Finner Primary Care Oliana Gowens: Marisue Ivan Other Clinician: Referring Orlandus Borowski: Treating Loras Grieshop/Extender: Rebecka Apley in Treatment: 10 Visit Information History Since Last Visit Added or deleted any medications: No Patient Arrived: Ambulatory Any new allergies or adverse reactions: No Arrival Time: 12:39 Had a fall or experienced change in No Accompanied By: self activities of daily living that may affect Transfer Assistance: None risk of falls: Patient Identification Verified: Yes Signs or symptoms of abuse/neglect since last visito No Secondary Verification Process Completed: Yes Hospitalized since last visit: No Patient Requires Transmission-Based Precautions: No Implantable device outside of the clinic excluding No Patient Has Alerts: Yes cellular tissue based products placed in the center Patient Alerts: PACEMAKER since last visit: Has Dressing in Place as Prescribed: Yes Pain Present Now: No Electronic Signature(s) Signed: 04/30/2023 4:13:25 PM By: Yevonne Pax RN Entered By: Yevonne Pax on 04/30/2023 12:39:42 -------------------------------------------------------------------------------- Clinic Level of Care Assessment Details Patient Name: Date of Service: SYLINA, LISLE 04/30/2023 12:30 PM Medical Record Number: [id number] Patient Account Number: [id number] Date of Birth/Sex: Treating RN: 14-Dec-1945 (77 y.o. Freddy Finner Primary Care Marketa Midkiff: Marisue Ivan Other Clinician: Referring Natalea Sutliff: Treating Theon Sobotka/Extender: Rebecka Apley in Treatment: 10 Clinic Level of Care  Assessment Items TOOL 4 Quantity Score X- 1 0 Use when only an EandM is performed on FOLLOW-UP visit ASSESSMENTS - Nursing Assessment / Reassessment X- 1 10 Reassessment of Co-morbidities (includes updates in patient status) X- 1 5 Reassessment of Adherence to Treatment Plan CARINA, MEINHOLD F ([id number]) [id number] Page 2 of 8 ASSESSMENTS - Wound and Skin A ssessment / Reassessment X - Simple Wound Assessment / Reassessment - one wound 1 5 []  - 0 Complex Wound Assessment / Reassessment - multiple wounds []  - 0 Dermatologic / Skin Assessment (not related to wound area) ASSESSMENTS - Focused Assessment []  - 0 Circumferential Edema Measurements - multi extremities []  - 0 Nutritional Assessment / Counseling / Intervention []  - 0 Lower Extremity Assessment (monofilament, tuning fork, pulses) []  - 0 Peripheral Arterial Disease Assessment (using hand held doppler) ASSESSMENTS - Ostomy and/or Continence Assessment and Care []  - 0 Incontinence Assessment and Management []  - 0 Ostomy Care Assessment and Management (repouching, etc.) PROCESS - Coordination of Care X - Simple Patient / Family Education for ongoing care 1 15 []  - 0 Complex (extensive) Patient / Family Education for ongoing care []  - 0 Staff obtains Chiropractor, Records, T Results / Process Orders est []  - 0 Staff telephones HHA, Nursing Homes / Clarify orders / etc []  - 0 Routine Transfer to another Facility (non-emergent condition) []  - 0 Routine Hospital Admission (non-emergent condition) []  - 0 New Admissions / Manufacturing engineer / Ordering NPWT Apligraf, etc. , []  - 0 Emergency Hospital Admission (emergent condition) X- 1 10 Simple Discharge Coordination []  - 0 Complex (extensive) Discharge Coordination PROCESS - Special Needs []  - 0 Pediatric / Minor Patient Management []  - 0 Isolation Patient Management []  - 0 Hearing / Language / Visual special needs []  - 0 Assessment of  Community assistance (transportation, D/C planning, etc.) []  - 0 Additional assistance / Altered mentation []  - 0 Support Surface(s) Assessment (bed, cushion, seat, etc.) INTERVENTIONS - Wound Cleansing / Measurement X - Simple  Wound Cleansing - one wound 1 5 []  - 0 Complex Wound Cleansing - multiple wounds X- 1 5 Wound Imaging (photographs - any number of wounds) []  - 0 Wound Tracing (instead of photographs) X- 1 5 Simple Wound Measurement - one wound []  - 0 Complex Wound Measurement - multiple wounds INTERVENTIONS - Wound Dressings X - Small Wound Dressing one or multiple wounds 1 10 []  - 0 Medium Wound Dressing one or multiple wounds []  - 0 Large Wound Dressing one or multiple wounds []  - 0 Application of Medications - topical []  - 0 Application of Medications - injection INTERVENTIONS - Miscellaneous []  - 0 External ear exam TRENAY, DRAXLER ([id number]) [id number] Page 3 of 8 []  - 0 Specimen Collection (cultures, biopsies, blood, body fluids, etc.) []  - 0 Specimen(s) / Culture(s) sent or taken to Lab for analysis []  - 0 Patient Transfer (multiple staff / Michiel Sites Lift / Similar devices) []  - 0 Simple Staple / Suture removal (25 or less) []  - 0 Complex Staple / Suture removal (26 or more) []  - 0 Hypo / Hyperglycemic Management (close monitor of Blood Glucose) []  - 0 Ankle / Brachial Index (ABI) - do not check if billed separately X- 1 5 Vital Signs Has the patient been seen at the hospital within the last three years: Yes Total Score: 75 Level Of Care: New/Established - Level 2 Electronic Signature(s) Signed: 04/30/2023 4:13:25 PM By: Yevonne Pax RN Entered By: Yevonne Pax on 04/30/2023 12:53:49 -------------------------------------------------------------------------------- Encounter Discharge Information Details Patient Name: Date of Service: [name], [name] 04/30/2023 12:30 PM Medical Record Number: [id number] Patient Account Number:  [id number] Date of Birth/Sex: Treating RN: January 09, 1946 (77 y.o. Freddy Finner Primary Care Burnice Oestreicher: Marisue Ivan Other Clinician: Referring Demarius Archila: Treating Aerial Dilley/Extender: Rebecka Apley in Treatment: 10 Encounter Discharge Information Items Discharge Condition: Stable Ambulatory Status: Ambulatory Discharge Destination: Home Transportation: Private Auto Accompanied By: self Schedule Follow-up Appointment: Yes Clinical Summary of Care: Electronic Signature(s) Signed: 04/30/2023 4:13:25 PM By: Yevonne Pax RN Entered By: Yevonne Pax on 04/30/2023 12:55:48 -------------------------------------------------------------------------------- Lower Extremity Assessment Details Patient Name: Date of Service: FRANCHON, SIRACUSA 04/30/2023 12:30 PM Despina Hick ([id number]) [id number] Page 4 of 8 Medical Record Number: [id number] Patient Account Number: [id number] Date of Birth/Sex: Treating RN: 08-Mar-1946 (77 y.o. Freddy Finner Primary Care Aloysius Heinle: Marisue Ivan Other Clinician: Referring Nithin Demeo: Treating Dewon Mendizabal/Extender: Maxwell Caul Weeks in Treatment: 10 Electronic Signature(s) Signed: 04/30/2023 4:13:25 PM By: Yevonne Pax RN Entered By: Yevonne Pax on 04/30/2023 12:52:41 -------------------------------------------------------------------------------- Multi Wound Chart Details Patient Name: Date of Service: [name], [name] 04/30/2023 12:30 PM Medical Record Number: [id number] Patient Account Number: [id number] Date of Birth/Sex: Treating RN: [birth year] (77 y.o. Freddy Finner Primary Care Charrise Lardner: Marisue Ivan Other Clinician: Referring Chadwick Reiswig: Treating Hillary Schwegler/Extender: Rebecka Apley in Treatment: 10 Vital Signs Height(in): 63 Pulse(bpm): 81 Weight(lbs): 125 Blood Pressure(mmHg): 127/68 Body Mass Index(BMI): 22.1 Temperature(F): 98 Respiratory  Rate(breaths/min): 16 [1:Photos:] [N/A:N/A] Left Hand - 4th Digit N/A N/A Wound Location: Gradually Appeared N/A N/A Wounding Event: Scleroderma N/A N/A Primary Etiology: Hypertension, Raynauds, N/A N/A Comorbid History: Scleroderma 02/13/2023 N/A N/A Date Acquired: 10 N/A N/A Weeks of Treatment: Healed - Epithelialized N/A N/A Wound Status: No N/A N/A Wound Recurrence: Yes N/A N/A Pending A mputation on Presentation: 0x0x0 N/A N/A Measurements L x W x D (cm) 0 N/A N/A A (cm) : rea 0 N/A N/A Volume (cm) : 100.00% N/A N/A % Reduction in  A rea: 100.00% N/A N/A % Reduction in Volume: Full Thickness Without Exposed N/A N/A Classification: Support Structures None Present N/A N/A Exudate Amount: None Present (0%) N/A N/A Granulation Amount: None Present (0%) N/A N/A Necrotic Amount: Fascia: No N/A N/A Exposed Structures: Fat Layer (Subcutaneous Tissue): No Tendon: No Muscle: No Joint: No ELLIANNAH, EKMAN ([id number]) [id number] Page 5 of 8 Bone: No Large (67-100%) N/A N/A Epithelialization: Treatment Notes Electronic Signature(s) Signed: 04/30/2023 4:13:25 PM By: Yevonne Pax RN Entered By: Yevonne Pax on 04/30/2023 12:52:46 -------------------------------------------------------------------------------- Multi-Disciplinary Care Plan Details Patient Name: Date of Service: [name] 04/30/2023 12:30 PM Medical Record Number: [id number] Patient Account Number: [id number] Date of Birth/Sex: Treating RN: May 04, 1946 (77 y.o. Freddy Finner Primary Care Khrystian Schauf: Marisue Ivan Other Clinician: Referring Latangela Mccomas: Treating Yamili Lichtenwalner/Extender: Rebecka Apley in Treatment: 10 Active Inactive Electronic Signature(s) Signed: 04/30/2023 4:13:25 PM By: Yevonne Pax RN Entered By: Yevonne Pax on 04/30/2023 12:54:10 -------------------------------------------------------------------------------- Pain Assessment  Details Patient Name: Date of Service: AYAHNA, KARY 04/30/2023 12:30 PM Medical Record Number: [id number] Patient Account Number: [id number] Date of Birth/Sex: Treating RN: 1946-04-30 (77 y.o. Freddy Finner Primary Care Sireen Halk: Marisue Ivan Other Clinician: Referring Tanay Misuraca: Treating Shaketha Jeon/Extender: Maxwell Caul Weeks in Treatment: 10 Active Problems Location of Pain Severity and Description of Pain Patient Has Paino No Site Locations [place], [place] F ([id number]) [id number] Page 6 of 8 Pain Management and Medication Current Pain Management: Electronic Signature(s) Signed: 04/30/2023 4:13:25 PM By: Yevonne Pax RN Entered By: Yevonne Pax on 04/30/2023 12:40:04 -------------------------------------------------------------------------------- Patient/Caregiver Education Details Patient Name: Date of Service: Sanjuana Kava 6/13/2024andnbsp12:30 PM Medical Record Number: [id number] Patient Account Number: [id number] Date of Birth/Gender: Treating RN: 11/08/1946 (77 y.o. Freddy Finner Primary Care Physician: Marisue Ivan Other Clinician: Referring Physician: Treating Physician/Extender: Rebecka Apley in Treatment: 10 Education Assessment Education Provided To: Patient Education Topics Provided Wound/Skin Impairment: Handouts: Caring for Your Ulcer Methods: Explain/Verbal Responses: State content correctly Electronic Signature(s) Signed: 04/30/2023 4:13:25 PM By: Yevonne Pax RN Entered By: Yevonne Pax on 04/30/2023 12:54:22 Despina Hick ([id number]) [id number] Page 7 of 8 -------------------------------------------------------------------------------- Wound Assessment Details Patient Name: Date of Service: BINNIE, ORTEZ 04/30/2023 12:30 PM Medical Record Number: [id number] Patient Account Number: [id number] Date of Birth/Sex: Treating RN: 21-Oct-1946 (77 y.o. Freddy Finner Primary Care Nikesh Teschner: Marisue Ivan Other Clinician: Referring Ajanee Buren: Treating Aristides Luckey/Extender: Maxwell Caul Weeks in Treatment: 10 Wound Status Wound Number: 1 Primary Etiology: Scleroderma Wound Location: Left Hand - 4th Digit Wound Status: Healed - Epithelialized Wounding Event: Gradually Appeared Comorbid History: Hypertension, Raynauds, Scleroderma Date Acquired: 02/13/2023 Weeks Of Treatment: 10 Clustered Wound: No Pending Amputation On Presentation Photos Wound Measurements Length: (cm) Width: (cm) Depth: (cm) Area: (cm) Volume: (cm) 0 % Reduction in Area: 100% 0 % Reduction in Volume: 100% 0 Epithelialization: Large (67-100%) 0 Tunneling: No 0 Undermining: No Wound Description Classification: Full Thickness Without Exposed Suppor Exudate Amount: None Present t Structures Foul Odor After Cleansing: No Slough/Fibrino No Wound Bed Granulation Amount: None Present (0%) Exposed Structure Necrotic Amount: None Present (0%) Fascia Exposed: No Fat Layer (Subcutaneous Tissue) Exposed: No Tendon Exposed: No Muscle Exposed: No Joint Exposed: No Bone Exposed: No Treatment Notes Wound #1 (Hand - 4th Digit) Wound Laterality: Left Cleanser Peri-Wound Care Topical EMIRETH, SAFRIT ([id number]) [id number] Page 8 of 8 Primary Dressing Secondary Dressing Secured With Compression Wrap Compression Stockings Add-Ons Electronic Signature(s) Signed: 04/30/2023 4:13:25 PM By:  Yevonne Pax RN Entered By: Yevonne Pax on 04/30/2023 12:52:31 -------------------------------------------------------------------------------- Vitals Details Patient Name: Date of Service: ROSALYNDA, TOMEK 04/30/2023 12:30 PM Medical Record Number: [id number] Patient Account Number: [id number] Date of Birth/Sex: Treating RN: [birth year] (77 y.o. Freddy Finner Primary Care Kelsie Zaborowski: Marisue Ivan Other Clinician: Referring  Brantleigh Mifflin: Treating Zubayr Bednarczyk/Extender: Rebecka Apley in Treatment: 10 Vital Signs Time Taken: 12:35 Temperature (F): 98 Height (in): 63 Pulse (bpm): 81 Weight (lbs): 125 Respiratory Rate (breaths/min): 16 Body Mass Index (BMI): 22.1 Blood Pressure (mmHg): 127/68 Reference Range: 80 - 120 mg / dl Electronic Signature(s) Signed: 04/30/2023 4:13:25 PM By: Yevonne Pax RN Entered By: Yevonne Pax on 04/30/2023 12:39:59

## 2023-05-04 ENCOUNTER — Encounter: Payer: Self-pay | Admitting: Dermatology

## 2023-05-04 DIAGNOSIS — C44319 Basal cell carcinoma of skin of other parts of face: Secondary | ICD-10-CM | POA: Diagnosis not present

## 2023-05-05 ENCOUNTER — Other Ambulatory Visit: Payer: Self-pay | Admitting: Dermatology

## 2023-05-05 DIAGNOSIS — I7300 Raynaud's syndrome without gangrene: Secondary | ICD-10-CM

## 2023-05-07 ENCOUNTER — Encounter: Payer: Medicare HMO | Admitting: Physician Assistant

## 2023-05-13 NOTE — Progress Notes (Signed)
GINNY, LOOMER ([id number]) [id number] Page 1 of 7 Visit Report for 04/23/2023 Chief Complaint Document Details Patient Name: Date of Service: [name], [name] 04/23/2023 12:30 PM Medical Record Number: [id number] Patient Account Number: [id number] Date of Birth/Sex: Treating RN: Apr 05, 1946 (77 y.o. Freddy Finner Primary Care Provider: Marisue Ivan Other Clinician: Referring Provider: Treating Provider/Extender: Rebecka Apley in Treatment: 9 Information Obtained from: Patient Chief Complaint Left 4th finger ulcer Electronic Signature(s) Signed: 04/23/2023 12:44:25 PM By: Allen Derry PA-C Entered By: Allen Derry on 04/23/2023 12:44:25 -------------------------------------------------------------------------------- HPI Details Patient Name: Date of Service: [name], [name] 04/23/2023 12:30 PM Medical Record Number: [id number] Patient Account Number: [id number] Date of Birth/Sex: Treating RN: 1946/09/13 (77 y.o. Freddy Finner Primary Care Provider: Marisue Ivan Other Clinician: Referring Provider: Treating Provider/Extender: Rebecka Apley in Treatment: 9 History of Present Illness HPI Description: 02-19-2023 upon evaluation today patient presents today for initial evaluation here in our clinic concerning issues she has been having with wounds on her hands that come intermittently. With that being said currently she has a wound which actually began she says around February 13, 2023. Initially showed up as a white bump that then became what she feels like was pus filled. 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 Nonetheless her primary care provider did put her on the standard treatment that he starts whenever this happens that is prednisone in a tapering Dosepak which she is now on the 2  pills a day for 3 days part of the regimen and then subsequently he also put her on doxycycline for 7 days total. With that being said there does appear to be some signs of potential release for infection and I do believe that this has been somewhat beneficial for her which is good news. I do not see any signs of active infection locally nor systemically at this time. The patient does have a history of scleroderma, Raynaud's syndrome, and hypertension. 02-27-2023 upon evaluation today patient appears to be doing well currently in regard to her finger ulcer. This actually looks to be much less inflamed compared to where we have been. Fortunately I do not see any signs of active infection locally nor systemically the wound looks better and smaller it also looks much less irritated compared to where it was I see no signs of active infection at this point all of which is excellent news. 03-05-2023 upon evaluation today patient appears to be doing well currently in regard to her wound. This is actually showing signs of improvement which is great news and very pleased in that regard. Fortunately there does not appear to be any signs of infection locally nor systemically which is great news. REONNA, FINLAYSON ([id number]) [id number] Page 2 of 7 03-26-2023 upon evaluation today patient appears to be doing well currently in regard to her wound. She has been tolerating the dressing changes without complication. Fortunately there does not appear to be any signs of active infection at this time. 03-12-2023 upon evaluation today patient appears to be doing well currently in regard to her wound. She has been tolerating the dressing changes without complication. Fortunately there does not appear to be any signs of active infection locally nor systemically which is great news as well. No fevers, chills, nausea, vomiting, or diarrhea. 03-19-2023 upon evaluation today patient appears to  be doing well  currently in regard to her wound which actually showed signs of excellent improvement. Fortunately I do not see any evidence of infection this seems to be healing quite nicely and very pleased. 04-02-2023 upon evaluation today patient appears to be doing well currently in regard to her wound. She has been tolerating dressing changes without complication and in general I am very pleased with where we stand today. I do not see any signs of active infection locally nor systemically at this point. 04-09-2023 upon evaluation today patient's wound does have some dry skin around the edges of the wound and some necrotic tissue in the center part of the wound. I am can actually perform some sharp debridement to clear this away today and she is in agreement with the plan. Fortunately I do not see any signs of active infection locally nor systemically at this time which is great news. No fevers, chills, nausea, vomiting, or diarrhea. 04-16-2023 upon evaluation today patient actually appears to be doing excellent in regard to her wounds. She has been tolerating dressing changes without complication. Fortunately there does not appear to be any signs of active infection locally or systemically which is great news. 04-23-2023 upon evaluation today patient appears to be doing excellent. In fact she seems to almost be completely healed which is great news. With that being said there is 1 little spot where there was a scab that when popped off has a small opening underneath there is no need for sharp debridement but she seems to be doing really well today. Electronic Signature(s) Signed: 04/23/2023 1:06:44 PM By: Allen Derry PA-C Entered By: Allen Derry on 04/23/2023 13:06:44 -------------------------------------------------------------------------------- Physical Exam Details Patient Name: Date of Service: Sanjuana Kava 04/23/2023 12:30 PM Medical Record Number: [id number] Patient Account Number: [id number] Date of  Birth/Sex: Treating RN: June 11, 1946 (77 y.o. Freddy Finner Primary Care Provider: Marisue Ivan Other Clinician: Referring Provider: Treating Provider/Extender: Maxwell Caul Weeks in Treatment: 9 Constitutional Well-nourished and well-hydrated in no acute distress. Respiratory normal breathing without difficulty. Psychiatric this patient is able to make decisions and demonstrates good insight into disease process. Alert and Oriented x 3. pleasant and cooperative. Notes Upon inspection patient's wound actually shows signs of excellent improvement she is very close to complete resolution there is just a very tiny area remaining at this point. No sharp debridement necessary this is literally about a pinpoint. Electronic Signature(s) Signed: 04/23/2023 1:06:54 PM By: Allen Derry PA-C Entered By: Allen Derry on 04/23/2023 13:06:54 Marijean Niemann F ([id number]) [id number] Page 3 of 7 -------------------------------------------------------------------------------- Physician Orders Details Patient Name: Date of Service: ALDEA, AVIS 04/23/2023 12:30 PM Medical Record Number: [id number] Patient Account Number: [id number] Date of Birth/Sex: Treating RN: Oct 22, 1946 (77 y.o. Freddy Finner Primary Care Provider: Marisue Ivan Other Clinician: Referring Provider: Treating Provider/Extender: Rebecka Apley in Treatment: 9 Verbal / Phone Orders: No Diagnosis Coding ICD-10 Coding Code Description M34.89 Other systemic sclerosis L98.498 Non-pressure chronic ulcer of skin of other sites with other specified severity I73.00 Raynaud's syndrome without gangrene I10 Essential (primary) hypertension Follow-up Appointments Return Appointment in 1 week. Bathing/ Shower/ Hygiene May shower; gently cleanse wound with antibacterial soap, rinse and pat dry prior to dressing wounds Anesthetic (Use 'Patient Medications' Section for  Anesthetic Order Entry) Lidocaine applied to wound bed Wound Treatment Wound #1 - Hand - 4th Digit Wound Laterality: Left Cleanser: Soap and Water 1 x Per Day/30 Days Discharge Instructions: Gently cleanse wound with  antibacterial soap, rinse and pat dry prior to dressing wounds Cleanser: Wound Cleanser 1 x Per Day/30 Days Discharge Instructions: Wash your hands with soap and water. Remove old dressing, discard into plastic bag and place into trash. Cleanse the wound with Wound Cleanser prior to applying a clean dressing using gauze sponges, not tissues or cotton balls. Do not scrub or use excessive force. Pat dry using gauze sponges, not tissue or cotton balls. Prim Dressing: Xeroform 5x9-HBD (in/in) 1 x Per Day/30 Days ary Discharge Instructions: Apply Xeroform 5x9-HBD (in/in) as directed Secondary Dressing: Coverlet Latex-Free Fabric Adhesive Dressings 1 x Per Day/30 Days Discharge Instructions: 1.5 x 2 Electronic Signature(s) Signed: 04/23/2023 4:39:56 PM By: Allen Derry PA-C Signed: 05/13/2023 11:52:51 AM By: Yevonne Pax RN Entered By: Yevonne Pax on 04/23/2023 13:05:38 Despina Hick ([id number]) [id number] Page 4 of 7 -------------------------------------------------------------------------------- Problem List Details Patient Name: Date of Service: MARKETIA, STALLSMITH 04/23/2023 12:30 PM Medical Record Number: [id number] Patient Account Number: [id number] Date of Birth/Sex: Treating RN: September 22, 1946 (77 y.o. Freddy Finner Primary Care Provider: Marisue Ivan Other Clinician: Referring Provider: Treating Provider/Extender: Maxwell Caul Weeks in Treatment: 9 Active Problems ICD-10 Encounter Code Description Active Date MDM Diagnosis M34.89 Other systemic sclerosis 02/19/2023 No Yes L98.498 Non-pressure chronic ulcer of skin of other sites with other specified severity 02/19/2023 No Yes I73.00 Raynaud's syndrome without gangrene 02/19/2023  No Yes I10 Essential (primary) hypertension 02/19/2023 No Yes Inactive Problems Resolved Problems Electronic Signature(s) Signed: 04/23/2023 12:44:17 PM By: Allen Derry PA-C Entered By: Allen Derry on 04/23/2023 12:44:17 -------------------------------------------------------------------------------- Progress Note Details Patient Name: Date of Service: [name] 04/23/2023 12:30 PM Medical Record Number: [id number] Patient Account Number: [id number] Date of Birth/Sex: Treating RN: November 02, 1946 (77 y.o. Freddy Finner Primary Care Provider: Marisue Ivan Other Clinician: Referring Provider: Treating Provider/Extender: Rebecka Apley in Treatment: [address] [place] ([id number]) [id number] Page 5 of 7 Subjective Chief Complaint Information obtained from Patient Left 4th finger ulcer History of Present Illness (HPI) 02-19-2023 upon evaluation today patient presents today for initial evaluation here in our clinic concerning issues she has been having with wounds on her hands that come intermittently. With that being said currently she has a wound which actually began she says around February 13, 2023. Initially showed up as a white bump that then became what she feels like was pus filled. 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 Nonetheless her primary care provider did put her on the standard treatment that he starts whenever this happens that is prednisone in a tapering Dosepak which she is now on the 2 pills a day for 3 days part of the regimen and then subsequently he also put her on doxycycline for 7 days total. With that being said there does appear to be some signs of potential release for infection and I do believe that this has been somewhat beneficial for her which is good news. I do not see any signs of active  infection locally nor systemically at this time. The patient does have a history of scleroderma, Raynaud's syndrome, and hypertension. 02-27-2023 upon evaluation today patient appears to be doing well currently in regard to her finger ulcer. This actually looks to be much less inflamed compared to where we have been. Fortunately I do not see any signs of active infection locally  nor systemically the wound looks better and smaller it also looks much less irritated compared to where it was I see no signs of active infection at this point all of which is excellent news. 03-05-2023 upon evaluation today patient appears to be doing well currently in regard to her wound. This is actually showing signs of improvement which is great news and very pleased in that regard. Fortunately there does not appear to be any signs of infection locally nor systemically which is great news. 03-26-2023 upon evaluation today patient appears to be doing well currently in regard to her wound. She has been tolerating the dressing changes without complication. Fortunately there does not appear to be any signs of active infection at this time. 03-12-2023 upon evaluation today patient appears to be doing well currently in regard to her wound. She has been tolerating the dressing changes without complication. Fortunately there does not appear to be any signs of active infection locally nor systemically which is great news as well. No fevers, chills, nausea, vomiting, or diarrhea. 03-19-2023 upon evaluation today patient appears to be doing well currently in regard to her wound which actually showed signs of excellent improvement. Fortunately I do not see any evidence of infection this seems to be healing quite nicely and very pleased. 04-02-2023 upon evaluation today patient appears to be doing well currently in regard to her wound. She has been tolerating dressing changes without complication and in general I am very pleased with where we  stand today. I do not see any signs of active infection locally nor systemically at this point. 04-09-2023 upon evaluation today patient's wound does have some dry skin around the edges of the wound and some necrotic tissue in the center part of the wound. I am can actually perform some sharp debridement to clear this away today and she is in agreement with the plan. Fortunately I do not see any signs of active infection locally nor systemically at this time which is great news. No fevers, chills, nausea, vomiting, or diarrhea. 04-16-2023 upon evaluation today patient actually appears to be doing excellent in regard to her wounds. She has been tolerating dressing changes without complication. Fortunately there does not appear to be any signs of active infection locally or systemically which is great news. 04-23-2023 upon evaluation today patient appears to be doing excellent. In fact she seems to almost be completely healed which is great news. With that being said there is 1 little spot where there was a scab that when popped off has a small opening underneath there is no need for sharp debridement but she seems to be doing really well today. Objective Constitutional Well-nourished and well-hydrated in no acute distress. Vitals Time Taken: 12:44 PM, Height: 63 in, Weight: 125 lbs, BMI: 22.1, Temperature: 97.9 F, Pulse: 69 bpm, Respiratory Rate: 18 breaths/min, Blood Pressure: 125/69 mmHg. Respiratory normal breathing without difficulty. Psychiatric this patient is able to make decisions and demonstrates good insight into disease process. Alert and Oriented x 3. pleasant and cooperative. General Notes: Upon inspection patient's wound actually shows signs of excellent improvement she is very close to complete resolution there is just a very tiny area remaining at this point. No sharp debridement necessary this is literally about a pinpoint. Integumentary (Hair, Skin) Wound #1 status is Open. Original  cause of wound was Gradually Appeared. The date acquired was: 02/13/2023. The wound has been in treatment 9 weeks. The wound is located on the Left Hand - 4th Digit. The wound  measures 0.1cm length x 0.1cm width x 0.1cm depth; 0.008cm^2 area and 0.001cm^3 volume. There is Fat Layer (Subcutaneous Tissue) exposed. There is no tunneling or undermining noted. There is a none present amount of drainage noted. There is large (67- 100%) red granulation within the wound bed. There is no necrotic tissue within the wound bed. [address] SARANYA, HARLIN ([id number]) [id number] Page 6 of 7 Active Problems ICD-10 Other systemic sclerosis Non-pressure chronic ulcer of skin of other sites with other specified severity Raynaud's syndrome without gangrene Essential (primary) hypertension Plan Follow-up Appointments: Return Appointment in 1 week. Bathing/ Shower/ Hygiene: May shower; gently cleanse wound with antibacterial soap, rinse and pat dry prior to dressing wounds Anesthetic (Use 'Patient Medications' Section for Anesthetic Order Entry): Lidocaine applied to wound bed WOUND #1: - Hand - 4th Digit Wound Laterality: Left Cleanser: Soap and Water 1 x Per Day/30 Days Discharge Instructions: Gently cleanse wound with antibacterial soap, rinse and pat dry prior to dressing wounds Cleanser: Wound Cleanser 1 x Per Day/30 Days Discharge Instructions: Wash your hands with soap and water. Remove old dressing, discard into plastic bag and place into trash. Cleanse the wound with Wound Cleanser prior to applying a clean dressing using gauze sponges, not tissues or cotton balls. Do not scrub or use excessive force. Pat dry using gauze sponges, not tissue or cotton balls. Prim Dressing: Xeroform 5x9-HBD (in/in) 1 x Per Day/30 Days ary Discharge Instructions: Apply Xeroform 5x9-HBD (in/in) as directed Secondary Dressing: Coverlet Latex-Free Fabric Adhesive Dressings 1 x Per Day/30  Days Discharge Instructions: 1.5 x 2 1. I would recommend that we have the patient continue to monitor for any evidence of infection or worsening. Based on what I am seeing I do believe that we are making some really good progress at this point. 2. I am also can recommend we have the patient continue with the Xeroform gauze dressing which I think is doing quite well. 3. I am also going to suggest that we just switch to using a coverlet which I think should do just fine. Honestly I believe this to make it easier for her to do the dressing changes and keep it in place. We will see patient back for reevaluation in 1 week here in the clinic. If anything worsens or changes patient will contact our office for additional recommendations. Electronic Signature(s) Signed: 04/23/2023 1:07:28 PM By: Allen Derry PA-C Entered By: Allen Derry on 04/23/2023 13:07:28 -------------------------------------------------------------------------------- SuperBill Details Patient Name: Date of Service: [name], [name] 04/23/2023 Medical Record Number: [id number] Patient Account Number: [id number] Date of Birth/Sex: Treating RN: [birth year] (77 y.o. Freddy Finner Primary Care Provider: Marisue Ivan Other Clinician: Referring Provider: Treating Provider/Extender: Maxwell Caul Weeks in Treatment: 9 Diagnosis Coding ICD-10 Codes Code Description M34.89 Other systemic sclerosis L98.498 Non-pressure chronic ulcer of skin of other sites with other specified severity I73.00 Raynaud's syndrome without gangrene I10 Essential (primary) hypertension FORTUNATA, BETTY F ([id number]) [id number] Page 7 of 7 Facility Procedures : CPT4 Code: [id number] Description: [phone] - WOUND CARE VISIT-LEV 2 EST PT Modifier: Quantity: 1 Physician Procedures : CPT4 Code Description Modifier [id number] - WC PHYS LEVEL 3 - EST PT ICD-10 Diagnosis Description M34.89 Other systemic sclerosis  L98.498 Non-pressure chronic ulcer of skin of other sites with other specified severity I73.00 Raynaud's syndrome  without gangrene I10 Essential (primary) hypertension Quantity: 1 Electronic Signature(s) Signed: 04/23/2023 1:12:57 PM By: Allen Derry PA-C Entered By: Allen Derry on 04/23/2023 13:12:57

## 2023-05-13 NOTE — Progress Notes (Signed)
Tammy, Simmons ([id number]) [id number] Page 1 of 7 Visit Report for 04/23/2023 Arrival Information Details Patient Name: Date of Service: [name], [name] 04/23/2023 12:30 PM Medical Record Number: [id number] Patient Account Number: [id number] Date of Birth/Sex: Treating RN: [birth year] (77 y.o. Freddy Finner Primary Care Tara Wich: Marisue Ivan Other Clinician: Referring Willa Brocks: Treating Fedra Lanter/Extender: Rebecka Apley in Treatment: 9 Visit Information History Since Last Visit Added or deleted any medications: No Patient Arrived: Ambulatory Any new allergies or adverse reactions: No Arrival Time: 12:43 Had a fall or experienced change in No Accompanied By: self activities of daily living that may affect Transfer Assistance: None risk of falls: Patient Identification Verified: Yes Signs or symptoms of abuse/neglect since last visito No Secondary Verification Process Completed: Yes Hospitalized since last visit: No Patient Requires Transmission-Based Precautions: No Implantable device outside of the clinic excluding No Patient Has Alerts: Yes cellular tissue based products placed in the center Patient Alerts: PACEMAKER since last visit: Has Dressing in Place as Prescribed: Yes Pain Present Now: No Electronic Signature(s) Signed: 05/13/2023 11:52:51 AM By: Yevonne Pax RN Entered By: Yevonne Pax on 04/23/2023 12:44:30 -------------------------------------------------------------------------------- Clinic Level of Care Assessment Details Patient Name: Date of Service: Tammy, Simmons 04/23/2023 12:30 PM Medical Record Number: [id number] Patient Account Number: [id number] Date of Birth/Sex: Treating RN: [birth year] (77 y.o. Freddy Finner Primary Care Annalie Wenner: Marisue Ivan Other Clinician: Referring Tanvi Gatling: Treating Shahrzad Koble/Extender: Rebecka Apley in Treatment: 9 Clinic Level of Care Assessment  Items TOOL 4 Quantity Score X- 1 0 Use when only an EandM is performed on FOLLOW-UP visit ASSESSMENTS - Nursing Assessment / Reassessment X- 1 10 Reassessment of Co-morbidities (includes updates in patient status) X- 1 5 Reassessment of Adherence to Treatment Plan [name], [name] ([id number]) [id number] Page 2 of 7 ASSESSMENTS - Wound and Skin A ssessment / Reassessment X - Simple Wound Assessment / Reassessment - one wound 1 5 []  - 0 Complex Wound Assessment / Reassessment - multiple wounds []  - 0 Dermatologic / Skin Assessment (not related to wound area) ASSESSMENTS - Focused Assessment []  - 0 Circumferential Edema Measurements - multi extremities []  - 0 Nutritional Assessment / Counseling / Intervention []  - 0 Lower Extremity Assessment (monofilament, tuning fork, pulses) []  - 0 Peripheral Arterial Disease Assessment (using hand held doppler) ASSESSMENTS - Ostomy and/or Continence Assessment and Care []  - 0 Incontinence Assessment and Management []  - 0 Ostomy Care Assessment and Management (repouching, etc.) PROCESS - Coordination of Care X - Simple Patient / Family Education for ongoing care 1 15 []  - 0 Complex (extensive) Patient / Family Education for ongoing care []  - 0 Staff obtains Chiropractor, Records, T Results / Process Orders est []  - 0 Staff telephones HHA, Nursing Homes / Clarify orders / etc []  - 0 Routine Transfer to another Facility (non-emergent condition) []  - 0 Routine Hospital Admission (non-emergent condition) []  - 0 New Admissions / Manufacturing engineer / Ordering NPWT Apligraf, etc. , []  - 0 Emergency Hospital Admission (emergent condition) X- 1 10 Simple Discharge Coordination []  - 0 Complex (extensive) Discharge Coordination PROCESS - Special Needs []  - 0 Pediatric / Minor Patient Management []  - 0 Isolation Patient Management []  - 0 Hearing / Language / Visual special needs []  - 0 Assessment of Community  assistance (transportation, D/C planning, etc.) []  - 0 Additional assistance / Altered mentation []  - 0 Support Surface(s) Assessment (bed, cushion, seat, etc.) INTERVENTIONS - Wound Cleansing / Measurement X - Simple  Wound Cleansing - one wound 1 5 []  - 0 Complex Wound Cleansing - multiple wounds X- 1 5 Wound Imaging (photographs - any number of wounds) []  - 0 Wound Tracing (instead of photographs) X- 1 5 Simple Wound Measurement - one wound []  - 0 Complex Wound Measurement - multiple wounds INTERVENTIONS - Wound Dressings X - Small Wound Dressing one or multiple wounds 1 10 []  - 0 Medium Wound Dressing one or multiple wounds []  - 0 Large Wound Dressing one or multiple wounds []  - 0 Application of Medications - topical []  - 0 Application of Medications - injection INTERVENTIONS - Miscellaneous []  - 0 External ear exam LOVETA, DELLIS ([id number]) [id number] Page 3 of 7 []  - 0 Specimen Collection (cultures, biopsies, blood, body fluids, etc.) []  - 0 Specimen(s) / Culture(s) sent or taken to Lab for analysis []  - 0 Patient Transfer (multiple staff / Michiel Sites Lift / Similar devices) []  - 0 Simple Staple / Suture removal (25 or less) []  - 0 Complex Staple / Suture removal (26 or more) []  - 0 Hypo / Hyperglycemic Management (close monitor of Blood Glucose) []  - 0 Ankle / Brachial Index (ABI) - do not check if billed separately X- 1 5 Vital Signs Has the patient been seen at the hospital within the last three years: Yes Total Score: 75 Level Of Care: New/Established - Level 2 Electronic Signature(s) Signed: 05/13/2023 11:52:51 AM By: Yevonne Pax RN Entered By: Yevonne Pax on 04/23/2023 13:05:12 -------------------------------------------------------------------------------- Encounter Discharge Information Details Patient Name: Date of Service: [name], Tammy Hong F. 04/23/2023 12:30 PM Medical Record Number: [id number] Patient Account Number:  [id number] Date of Birth/Sex: Treating RN: 02-12-1946 (77 y.o. Freddy Finner Primary Care Martin Smeal: Marisue Ivan Other Clinician: Referring Shailee Foots: Treating Nitzia Perren/Extender: Rebecka Apley in Treatment: 9 Encounter Discharge Information Items Discharge Condition: Stable Ambulatory Status: Ambulatory Discharge Destination: Home Transportation: Private Auto Accompanied By: self Schedule Follow-up Appointment: Yes Clinical Summary of Care: Electronic Signature(s) Signed: 05/13/2023 11:52:51 AM By: Yevonne Pax RN Entered By: Yevonne Pax on 04/23/2023 13:06:12 -------------------------------------------------------------------------------- Lower Extremity Assessment Details Patient Name: Date of Service: TAMIRA, RYLAND 04/23/2023 12:30 PM Despina Hick ([id number]) [id number] Page 4 of 7 Medical Record Number: [id number] Patient Account Number: [id number] Date of Birth/Sex: Treating RN: 1946/08/14 (77 y.o. Freddy Finner Primary Care Jlynn Langille: Marisue Ivan Other Clinician: Referring Emmett Bracknell: Treating Daneesha Quinteros/Extender: Maxwell Caul Weeks in Treatment: 9 Electronic Signature(s) Signed: 05/13/2023 11:52:51 AM By: Yevonne Pax RN Entered By: Yevonne Pax on 04/23/2023 12:47:04 -------------------------------------------------------------------------------- Multi Wound Chart Details Patient Name: Date of Service: [name], Tammy Hong F. 04/23/2023 12:30 PM Medical Record Number: [id number] Patient Account Number: [id number] Date of Birth/Sex: Treating RN: [birth year] (77 y.o. Freddy Finner Primary Care Alsha Meland: Marisue Ivan Other Clinician: Referring Marchello Rothgeb: Treating Andreas Sobolewski/Extender: Rebecka Apley in Treatment: 9 Vital Signs Height(in): 63 Pulse(bpm): 69 Weight(lbs): 125 Blood Pressure(mmHg): 125/69 Body Mass Index(BMI): 22.1 Temperature(F): 97.9 Respiratory  Rate(breaths/min): 18 [1:Photos:] [N/A:N/A] Left Hand - 4th Digit N/A N/A Wound Location: Gradually Appeared N/A N/A Wounding Event: Scleroderma N/A N/A Primary Etiology: Hypertension, Raynauds, N/A N/A Comorbid History: Scleroderma 02/13/2023 N/A N/A Date Acquired: 9 N/A N/A Weeks of Treatment: Open N/A N/A Wound Status: No N/A N/A Wound Recurrence: Yes N/A N/A Pending A mputation on Presentation: 0.1x0.1x0.1 N/A N/A Measurements L x W x D (cm) 0.008 N/A N/A A (cm) : rea 0.001 N/A N/A Volume (cm) : 99.90% N/A N/A % Reduction in A rea:  99.90% N/A N/A % Reduction in Volume: Full Thickness Without Exposed N/A N/A Classification: Support Structures None Present N/A N/A Exudate Amount: Large (67-100%) N/A N/A Granulation Amount: Red N/A N/A Granulation Quality: None Present (0%) N/A N/A Necrotic Amount: Fat Layer (Subcutaneous Tissue): Yes N/A N/A Exposed Structures: Fascia: No Tendon: No Muscle: No SHERALD, BALBUENA F ([id number]) [id number] Page 5 of 7 Joint: No Bone: No None N/A N/A Epithelialization: Treatment Notes Electronic Signature(s) Signed: 05/13/2023 11:52:51 AM By: Yevonne Pax RN Entered By: Yevonne Pax on 04/23/2023 13:04:13 -------------------------------------------------------------------------------- Pain Assessment Details Patient Name: Date of Service: LASHARN, BUFKIN 04/23/2023 12:30 PM Medical Record Number: [id number] Patient Account Number: [id number] Date of Birth/Sex: Treating RN: 1946/04/26 (77 y.o. Freddy Finner Primary Care Zenab Gronewold: Marisue Ivan Other Clinician: Referring Luvina Poirier: Treating Deneane Stifter/Extender: Maxwell Caul Weeks in Treatment: 9 Active Problems Location of Pain Severity and Description of Pain Patient Has Paino No Site Locations Pain Management and Medication Current Pain Management: Electronic Signature(s) Signed: 05/13/2023 11:52:51 AM By: Yevonne Pax  RN Entered By: Yevonne Pax on 04/23/2023 12:44:59 Marijean Niemann F ([id number]) [id number] Page 6 of 7 -------------------------------------------------------------------------------- Wound Assessment Details Patient Name: Date of Service: CHLOEY, RICARD 04/23/2023 12:30 PM Medical Record Number: [id number] Patient Account Number: [id number] Date of Birth/Sex: Treating RN: Feb 17, 1946 (77 y.o. Freddy Finner Primary Care Marrian Bells: Marisue Ivan Other Clinician: Referring Samentha Perham: Treating Saban Heinlen/Extender: Maxwell Caul Weeks in Treatment: 9 Wound Status Wound Number: 1 Primary Etiology: Scleroderma Wound Location: Left Hand - 4th Digit Wound Status: Open Wounding Event: Gradually Appeared Comorbid History: Hypertension, Raynauds, Scleroderma Date Acquired: 02/13/2023 Weeks Of Treatment: 9 Clustered Wound: No Pending Amputation On Presentation Photos Wound Measurements Length: (cm) 0.1 Width: (cm) 0.1 Depth: (cm) 0.1 Area: (cm) 0.008 Volume: (cm) 0.001 % Reduction in Area: 99.9% % Reduction in Volume: 99.9% Epithelialization: None Tunneling: No Undermining: No Wound Description Classification: Full Thickness Without Exposed Suppor Exudate Amount: None Present t Structures Foul Odor After Cleansing: No Slough/Fibrino No Wound Bed Granulation Amount: Large (67-100%) Exposed Structure Granulation Quality: Red Fascia Exposed: No Necrotic Amount: None Present (0%) Fat Layer (Subcutaneous Tissue) Exposed: Yes Tendon Exposed: No Muscle Exposed: No Joint Exposed: No Bone Exposed: No Electronic Signature(s) Signed: 05/13/2023 11:52:51 AM By: Yevonne Pax RN Entered By: Yevonne Pax on 04/23/2023 13:03:53 Despina Hick ([id number]) [id number] Page 7 of 7 -------------------------------------------------------------------------------- Vitals Details Patient Name: Date of Service: ENISA, RUNYAN  04/23/2023 12:30 PM Medical Record Number: [id number] Patient Account Number: [id number] Date of Birth/Sex: Treating RN: 07/18/1946 (77 y.o. Freddy Finner Primary Care Aury Scollard: Marisue Ivan Other Clinician: Referring Merideth Bosque: Treating Henery Betzold/Extender: Rebecka Apley in Treatment: 9 Vital Signs Time Taken: 12:44 Temperature (F): 97.9 Height (in): 63 Pulse (bpm): 69 Weight (lbs): 125 Respiratory Rate (breaths/min): 18 Body Mass Index (BMI): 22.1 Blood Pressure (mmHg): 125/69 Reference Range: 80 - 120 mg / dl Electronic Signature(s) Signed: 05/13/2023 11:52:51 AM By: Yevonne Pax RN Entered By: Yevonne Pax on 04/23/2023 12:44:53

## 2023-05-14 ENCOUNTER — Encounter: Payer: Medicare HMO | Admitting: Physician Assistant

## 2023-06-11 DIAGNOSIS — E039 Hypothyroidism, unspecified: Secondary | ICD-10-CM | POA: Diagnosis not present

## 2023-06-11 DIAGNOSIS — Z862 Personal history of diseases of the blood and blood-forming organs and certain disorders involving the immune mechanism: Secondary | ICD-10-CM | POA: Diagnosis not present

## 2023-06-11 DIAGNOSIS — I10 Essential (primary) hypertension: Secondary | ICD-10-CM | POA: Diagnosis not present

## 2023-06-17 DIAGNOSIS — Z8679 Personal history of other diseases of the circulatory system: Secondary | ICD-10-CM | POA: Diagnosis not present

## 2023-06-17 DIAGNOSIS — I272 Pulmonary hypertension, unspecified: Secondary | ICD-10-CM | POA: Diagnosis not present

## 2023-06-18 ENCOUNTER — Encounter: Payer: Medicare HMO | Admitting: Dermatology

## 2023-06-18 DIAGNOSIS — Z136 Encounter for screening for cardiovascular disorders: Secondary | ICD-10-CM | POA: Diagnosis not present

## 2023-06-18 DIAGNOSIS — D649 Anemia, unspecified: Secondary | ICD-10-CM | POA: Diagnosis not present

## 2023-06-18 DIAGNOSIS — I10 Essential (primary) hypertension: Secondary | ICD-10-CM | POA: Diagnosis not present

## 2023-06-18 DIAGNOSIS — Z0000 Encounter for general adult medical examination without abnormal findings: Secondary | ICD-10-CM | POA: Diagnosis not present

## 2023-07-02 DIAGNOSIS — M79675 Pain in left toe(s): Secondary | ICD-10-CM | POA: Diagnosis not present

## 2023-07-02 DIAGNOSIS — B351 Tinea unguium: Secondary | ICD-10-CM | POA: Diagnosis not present

## 2023-07-02 DIAGNOSIS — M79674 Pain in right toe(s): Secondary | ICD-10-CM | POA: Diagnosis not present

## 2023-07-05 DIAGNOSIS — Z8679 Personal history of other diseases of the circulatory system: Secondary | ICD-10-CM | POA: Insufficient documentation

## 2023-07-05 DIAGNOSIS — I10 Essential (primary) hypertension: Secondary | ICD-10-CM | POA: Insufficient documentation

## 2023-07-06 ENCOUNTER — Encounter: Payer: Self-pay | Admitting: Dermatology

## 2023-07-06 ENCOUNTER — Ambulatory Visit: Payer: Medicare HMO | Admitting: Dermatology

## 2023-07-06 VITALS — BP 139/74

## 2023-07-06 DIAGNOSIS — L578 Other skin changes due to chronic exposure to nonionizing radiation: Secondary | ICD-10-CM | POA: Diagnosis not present

## 2023-07-06 DIAGNOSIS — Z872 Personal history of diseases of the skin and subcutaneous tissue: Secondary | ICD-10-CM

## 2023-07-06 DIAGNOSIS — W908XXA Exposure to other nonionizing radiation, initial encounter: Secondary | ICD-10-CM

## 2023-07-06 DIAGNOSIS — D692 Other nonthrombocytopenic purpura: Secondary | ICD-10-CM

## 2023-07-06 DIAGNOSIS — Z86018 Personal history of other benign neoplasm: Secondary | ICD-10-CM

## 2023-07-06 DIAGNOSIS — Z85828 Personal history of other malignant neoplasm of skin: Secondary | ICD-10-CM

## 2023-07-06 DIAGNOSIS — L570 Actinic keratosis: Secondary | ICD-10-CM | POA: Diagnosis not present

## 2023-07-06 DIAGNOSIS — L821 Other seborrheic keratosis: Secondary | ICD-10-CM

## 2023-07-06 DIAGNOSIS — D492 Neoplasm of unspecified behavior of bone, soft tissue, and skin: Secondary | ICD-10-CM

## 2023-07-06 DIAGNOSIS — D229 Melanocytic nevi, unspecified: Secondary | ICD-10-CM

## 2023-07-06 DIAGNOSIS — D1801 Hemangioma of skin and subcutaneous tissue: Secondary | ICD-10-CM | POA: Diagnosis not present

## 2023-07-06 DIAGNOSIS — C44719 Basal cell carcinoma of skin of left lower limb, including hip: Secondary | ICD-10-CM

## 2023-07-06 DIAGNOSIS — Z1283 Encounter for screening for malignant neoplasm of skin: Secondary | ICD-10-CM | POA: Diagnosis not present

## 2023-07-06 DIAGNOSIS — I7300 Raynaud's syndrome without gangrene: Secondary | ICD-10-CM | POA: Diagnosis not present

## 2023-07-06 DIAGNOSIS — L814 Other melanin hyperpigmentation: Secondary | ICD-10-CM | POA: Diagnosis not present

## 2023-07-06 DIAGNOSIS — M349 Systemic sclerosis, unspecified: Secondary | ICD-10-CM

## 2023-07-06 DIAGNOSIS — L853 Xerosis cutis: Secondary | ICD-10-CM

## 2023-07-06 NOTE — Patient Instructions (Addendum)

## 2023-07-06 NOTE — Progress Notes (Signed)
Follow-Up Visit   Subjective  [name] is a 77 y.o. female who presents for the following: Skin Cancer Screening and Full Body Skin Exam, Hx of BCCs, SCC, Dysplastic Nevi, AKs  The patient presents for Total-Body Skin Exam (TBSE) for skin cancer screening and mole check. The patient has spots, moles and lesions to be evaluated, some may be new or changing and the patient may have concern these could be cancer.    The following portions of the chart were reviewed this encounter and updated as appropriate: medications, allergies, medical history  Review of Systems:  No other skin or systemic complaints except as noted in HPI or Assessment and Plan.  Objective  Well appearing patient in no apparent distress; mood and affect are within normal limits.  A full examination was performed including scalp, head, eyes, ears, nose, lips, neck, chest, axillae, abdomen, back, buttocks, bilateral upper extremities, bilateral lower extremities, hands, feet, fingers, toes, fingernails, and toenails. All findings within normal limits unless otherwise noted below.   Relevant physical exam findings are noted in the Assessment and Plan.  R lat cheek x 1, L zygoma x 1, R sup forehead x 1, (3) Pink scaly macules  L pretibia Pink scar like pap with focal erosion 6.70mm       Assessment & Plan   SKIN CANCER SCREENING PERFORMED TODAY.  ACTINIC DAMAGE - Chronic condition, secondary to cumulative UV/sun exposure - diffuse scaly erythematous macules with underlying dyspigmentation - Recommend daily broad spectrum sunscreen SPF 30+ to sun-exposed areas, reapply every 2 hours as needed.  - Staying in the shade or wearing long sleeves, sun glasses (UVA+UVB protection) and wide brim hats (4-inch brim around the entire circumference of the hat) are also recommended for sun protection.  - Call for new or changing lesions.  LENTIGINES, SEBORRHEIC KERATOSES, HEMANGIOMAS - Benign normal skin lesions -  Benign-appearing - Call for any changes  MELANOCYTIC NEVI - Tan-brown and/or pink-flesh-colored symmetric macules and papules - Benign appearing on exam today - Observation - Call clinic for new or changing moles - Recommend daily use of broad spectrum spf 30+ sunscreen to sun-exposed areas.   HISTORY OF BASAL CELL CARCINOMA OF THE SKIN - No evidence of recurrence today - Recommend regular full body skin exams - Recommend daily broad spectrum sunscreen SPF 30+ to sun-exposed areas, reapply every 2 hours as needed.  - Call if any new or changing lesions are noted between office visits  - L upper forehead 07/09/2018, nasal dorsum 09/01/2019, R pretibial EDC 01/13/23, R medial cheek mohs 05/04/23  HISTORY OF SQUAMOUS CELL CARCINOMA OF THE SKIN - No evidence of recurrence today - No lymphadenopathy - Recommend regular full body skin exams - Recommend daily broad spectrum sunscreen SPF 30+ to sun-exposed areas, reapply every 2 hours as needed.  - Call if any new or changing lesions are noted between office visits - R cheek mohs 01/08/2021  HISTORY OF DYSPLASTIC NEVUS No evidence of recurrence today Recommend regular full body skin exams Recommend daily broad spectrum sunscreen SPF 30+ to sun-exposed areas, reapply every 2 hours as needed.  Call if any new or changing lesions are noted between office visits  - R sup chest, R flank lat to breast  RAYNAUD'S SYNDROME WITH SCLERODERMA Bil hands Exam: Loss of multiple distal fingertip, red discoloration   Treatment Plan: Cont f/u with rheumatology, Dr. Allena Katz   AK (actinic keratosis) (3) R lat cheek x 1, L zygoma x 1, R sup  forehead x 1,  Actinic keratoses are precancerous spots that appear secondary to cumulative UV radiation exposure/sun exposure over time. They are chronic with expected duration over 1 year. A portion of actinic keratoses will progress to squamous cell carcinoma of the skin. It is not possible to reliably predict  which spots will progress to skin cancer and so treatment is recommended to prevent development of skin cancer.  Recommend daily broad spectrum sunscreen SPF 30+ to sun-exposed areas, reapply every 2 hours as needed.  Recommend staying in the shade or wearing long sleeves, sun glasses (UVA+UVB protection) and wide brim hats (4-inch brim around the entire circumference of the hat). Call for new or changing lesions.  Destruction of lesion - R lat cheek x 1, L zygoma x 1, R sup forehead x 1, (3)  Destruction method: cryotherapy   Informed consent: discussed and consent obtained   Lesion destroyed using liquid nitrogen: Yes   Region frozen until ice ball extended beyond lesion: Yes   Outcome: patient tolerated procedure well with no complications   Post-procedure details: wound care instructions given   Additional details:  Prior to procedure, discussed risks of blister formation, small wound, skin dyspigmentation, or rare scar following cryotherapy. Recommend Vaseline ointment to treated areas while healing.   Neoplasm of skin L pretibia  Skin / nail biopsy Type of biopsy: tangential   Informed consent: discussed and consent obtained   Anesthesia: the lesion was anesthetized in a standard fashion   Anesthesia comment:  Area prepped with alcohol Anesthetic:  1% lidocaine w/ epinephrine 1-100,000 buffered w/ 8.4% NaHCO3 Instrument used: flexible razor blade   Hemostasis achieved with: pressure, aluminum chloride and electrodesiccation   Outcome: patient tolerated procedure well   Post-procedure details: wound care instructions given   Post-procedure details comment:  Ointment and small bandage applied  Specimen 1 - Surgical pathology Differential Diagnosis: D48.5 R/O SCC vs BCC  Check Margins: No Pink scar like pap with focal erosion 6.62mm   Purpura - Chronic; persistent and recurrent.  Treatable, but not curable. - Violaceous macules and patches - Benign - Related to trauma, age,  sun damage and/or use of blood thinners, chronic use of topical and/or oral steroids - Observe - Can use OTC arnica containing moisturizer such as Dermend Bruise Formula if desired - Call for worsening or other concerns  - R arm  Return in about 6 months (around 01/06/2024) for AK f/u.  I, Ardis Rowan, RMA, am acting as scribe for Elie Goody, MD .   Documentation: I have reviewed the above documentation for accuracy and completeness, and I agree with the above.  Elie Goody, MD

## 2023-07-09 ENCOUNTER — Telehealth: Payer: Self-pay

## 2023-07-09 NOTE — Telephone Encounter (Signed)
Advised patient of bx results and discussed treatment options.  Patient would like to treat with EDC.  Scheduled patient for 07/29/23 at 1:45.

## 2023-07-09 NOTE — Telephone Encounter (Signed)
-----   Message from [place] sent at 07/08/2023  8:01 PM EDT ----- Diagnosis: Skin , left pretibia SUPERFICIAL BASAL CELL CARCINOMA WITH FOCAL INFILTRATION  Please call to share diagnosis and discuss treatment options. Please message me with patient's choice and schedule ED&C or prescribe imiquimod with 3 month follow up for recheck to ensure clearance (assuming patient completes treatment course)  Explanation: your biopsy shows a basal cell skin cancer limited to the top layer of skin. This is the most common kind of skin cancer and is caused by damage from sun exposure. Basal cell skin cancers almost never spread beyond the skin, so they are not dangerous to your overall health. However, they will continue to grow, can bleed, cause nonhealing wounds, and disrupt nearby structures unless fully treated.  Treatment option 1: you return for a brief appointment where I perform electrodesiccation and curettage [facility]). This involves three rounds of scraping and burning to destroy the skin cancer. It has about an 85% cure rate and leaves a round wound slightly larger than the skin cancer and leaves a round white scar. No additional pathology is done. If the skin cancer comes back, we would need to do a surgery to remove it.   Treatment option 2: imiquimod is a cream that helps your immune system clear the skin cancer. You will apply the cream on weekdays for 6 weeks. If redness and irritation develop, take 3 days off before restarting. If an open sore develops, stop the cream and send [place] a message on MyChart or call us.  For option 2: Free text instructions and copy paste: "apply the cream once daily 5 days per week, prior to normal sleeping hours, for 6 weeks. Leave it on your skin for ~8 hours, then remove with mild soap and water. Apply enough cream to cover the treatment area, including a quarter inch of skin surrounding the tumor." Prescribe 12 packets with 2 refills.  Option 3: Mohs Mohs  surgery involves cutting out the skin cancer and then checking under the microscope to ensure the whole skin cancer was removed. If any skin cancer remains, the surgeon will cut out more until it is fully removed. The cure rate is about 98-99%. Once the Mohs surgeon confirms the skin cancer is out, they will discuss the options to repair or heal the area. You must take it easy for about two weeks after surgery (no lifting over 10-15 lbs, avoid activity to get your heart rate and blood pressure up). It is done at another office outside of [place] ([place], [place], or [place]). Approximately 98-99% cure rate

## 2023-07-29 ENCOUNTER — Encounter: Payer: Self-pay | Admitting: Dermatology

## 2023-07-29 ENCOUNTER — Ambulatory Visit: Payer: Medicare HMO | Admitting: Dermatology

## 2023-07-29 VITALS — BP 139/64 | HR 79

## 2023-07-29 DIAGNOSIS — C44719 Basal cell carcinoma of skin of left lower limb, including hip: Secondary | ICD-10-CM | POA: Diagnosis not present

## 2023-07-29 NOTE — Progress Notes (Signed)
   Follow-Up Visit   Subjective  [name] is a 77 y.o. female who presents for the following: EDC of biopsy proven BCC at left pretibia. Bx 07/06/2023. Patient reports area has been healing well.   The patient has spots, moles and lesions to be evaluated, some may be new or changing and the patient may have concern these could be cancer.   The following portions of the chart were reviewed this encounter and updated as appropriate: medications, allergies, medical history  Review of Systems:  No other skin or systemic complaints except as noted in HPI or Assessment and Plan.  Objective  Well appearing patient in no apparent distress; mood and affect are within normal limits.  A focused examination was performed of the following areas: Left leg  Relevant exam findings are noted in the Assessment and Plan.  left pretibia Pink healing biopsy site with eschar    Assessment & Plan     Basal cell carcinoma (BCC) of skin of left lower extremity including hip left pretibia  Destruction of lesion  Destruction method: electrodesiccation and curettage   Informed consent: discussed and consent obtained   Timeout:  patient name, date of birth, surgical site, and procedure verified Patient was prepped and draped in usual sterile fashion: area prepped with alcohol. Anesthesia: the lesion was anesthetized in a standard fashion   Anesthetic:  1% lidocaine w/ epinephrine 1-100,000 local infiltration Curettage performed in three different directions: Yes   Electrodesiccation performed over the curetted area: Yes   Curettage cycles:  3 Final wound size (cm):  1.3 Hemostasis achieved with:  pressure, aluminum chloride and electrodesiccation Outcome: patient tolerated procedure well with no complications   Post-procedure details: wound care instructions given   Post-procedure details comment:  Ointment and small bandage applied Additional details:  13 x 9 mm final wound size    Return  for AK Follow Up As Scheduled.  I, Lawson Radar, CMA, am acting as scribe for Elie Goody, MD.   Documentation: I have reviewed the above documentation for accuracy and completeness, and I agree with the above.  Elie Goody, MD

## 2023-07-29 NOTE — Patient Instructions (Signed)
Wound Care Instructions  Cleanse wound gently with soap and water once a day then pat dry with clean gauze. Apply a thin coat of Petrolatum (petroleum jelly, "Vaseline") over the wound (unless you have an allergy to this). We recommend that you use a new, sterile tube of Vaseline. Do not pick or remove scabs. Do not remove the yellow or white "healing tissue" from the base of the wound.  Cover the wound with fresh, clean, nonstick gauze and secure with paper tape. You may use Band-Aids in place of gauze and tape if the wound is small enough, but would recommend trimming much of the tape off as there is often too much. Sometimes Band-Aids can irritate the skin.  You should call the office for your biopsy report after 1 week if you have not already been contacted.  If you experience any problems, such as abnormal amounts of bleeding, swelling, significant bruising, significant pain, or evidence of infection, please call the office immediately.  FOR ADULT SURGERY PATIENTS: If you need something for pain relief you may take 1 extra strength Tylenol (acetaminophen) AND 2 Ibuprofen (200mg  each) together every 4 hours as needed for pain. (do not take these if you are allergic to them or if you have a reason you should not take them.) Typically, you may only need pain medication for 1 to 3 days.    Recommend daily broad spectrum sunscreen SPF 30+ to sun-exposed areas, reapply every 2 hours as needed. Call for new or changing lesions.  Staying in the shade or wearing long sleeves, sun glasses (UVA+UVB protection) and wide brim hats (4-inch brim around the entire circumference of the hat) are also recommended for sun protection.    Due to recent changes in healthcare laws, you may see results of your pathology and/or laboratory studies on MyChart before the doctors have had a chance to review them. We understand that in some cases there may be results that are confusing or concerning to you. Please understand  that not all results are received at the same time and often the doctors may need to interpret multiple results in order to provide you with the best plan of care or course of treatment. Therefore, we ask that you please give [place] 2 business days to thoroughly review all your results before contacting the office for clarification. Should we see a critical lab result, you will be contacted sooner.   If You Need Anything After Your Visit  If you have any questions or concerns for your doctor, please call our main line at [phone] and press option 4 to reach your doctor's medical assistant. If no one answers, please leave a voicemail as directed and we will return your call as soon as possible. Messages left after 4 pm will be answered the following business day.   You may also send [place] a message via MyChart. We typically respond to MyChart messages within 1-2 business days.  For prescription refills, please ask your pharmacy to contact our office. Our fax number is [phone].  If you have an urgent issue when the clinic is closed that cannot wait until the next business day, you can page your doctor at the number below.    Please note that while we do our best to be available for urgent issues outside of office hours, we are not available 24/7.   If you have an urgent issue and are unable to reach [place], you may choose to seek medical care at your doctor's office,  retail clinic, urgent care center, or emergency room.  If you have a medical emergency, please immediately call 911 or go to the emergency department.  Pager Numbers  - Dr. Gwen Pounds: [phone]  - Dr. Roseanne Reno: [phone]  - Dr. Katrinka Blazing: [phone]   In the event of inclement weather, please call our main line at [phone] for an update on the status of any delays or closures.  Dermatology Medication Tips: Please keep the boxes that topical medications come in in order to help keep track of the instructions about where  and how to use these. Pharmacies typically print the medication instructions only on the boxes and not directly on the medication tubes.   If your medication is too expensive, please contact our office at [phone] option 4 or send [place] a message through MyChart.   We are unable to tell what your co-pay for medications will be in advance as this is different depending on your insurance coverage. However, we may be able to find a substitute medication at lower cost or fill out paperwork to get insurance to cover a needed medication.   If a prior authorization is required to get your medication covered by your insurance company, please allow [place] 1-2 business days to complete this process.  Drug prices often vary depending on where the prescription is filled and some pharmacies may offer cheaper prices.  The website www.goodrx.com contains coupons for medications through different pharmacies. The prices here do not account for what the cost may be with help from insurance (it may be cheaper with your insurance), but the website can give you the price if you did not use any insurance.  - You can print the associated coupon and take it with your prescription to the pharmacy.  - You may also stop by our office during regular business hours and pick up a GoodRx coupon card.  - If you need your prescription sent electronically to a different pharmacy, notify our office through [facility] or by phone at [phone] option 4.     Si Usted Necesita Algo Despus de Su Visita  Tambin puede enviarnos un mensaje a travs de Clinical cytogeneticist. Por lo general respondemos a los mensajes de MyChart en el transcurso de 1 a 2 das hbiles.  Para renovar recetas, por favor pida a su farmacia que se ponga en contacto con nuestra oficina. Annie Sable de fax es [place] [phone].  Si tiene un asunto urgente cuando la clnica est cerrada y que no puede esperar hasta el siguiente da hbil, puede llamar/localizar a  su doctor(a) al nmero que aparece a continuacin.   Por favor, tenga en cuenta que aunque hacemos todo lo posible para estar disponibles para asuntos urgentes fuera del horario de [place], no estamos disponibles las 24 horas del da, los 7 [address]  [address] de la [place].   Si tiene un problema urgente y no puede comunicarse con nosotros, puede optar por buscar atencin mdica  en el consultorio de su doctor(a), en una clnica privada, en un centro de atencin urgente o en una sala de emergencias.  Si tiene Engineer, drilling, por favor llame inmediatamente al 911 o vaya a la sala de emergencias.  Nmeros de bper  - Dr. Gwen Pounds: [phone]  - Dra. Roseanne Reno: [phone]  - Dr. Katrinka Blazing: [phone]   En caso de inclemencias del tiempo, por favor llame a Lacy Duverney principal al [phone] para una actualizacin sobre el [place] de cualquier retraso o cierre.  Consejos para la medicacin en dermatologa: Por  favor, guarde las cajas en las que vienen los medicamentos de uso tpico para ayudarle a seguir las instrucciones sobre dnde y cmo usarlos. Las farmacias generalmente imprimen las instrucciones del medicamento slo en las cajas y no directamente en los tubos del [place].   Si su medicamento es muy caro, por favor, pngase en contacto con Rolm Gala llamando al [phone] y presione la opcin 4 o envenos un mensaje a travs de Clinical cytogeneticist.   No podemos decirle cul ser su copago por los medicamentos por adelantado ya que esto es diferente dependiendo de la cobertura de su seguro. Sin embargo, es posible que podamos encontrar un medicamento sustituto a Audiological scientist un formulario para que el seguro cubra el medicamento que se considera necesario.   Si se requiere una autorizacin previa para que su compaa de seguros [place] su medicamento, por favor permtanos de 1 a 2 das hbiles para completar [address]  Los precios de los medicamentos varan con frecuencia dependiendo  del Environmental consultant de dnde se surte la receta y alguna farmacias pueden ofrecer precios ms baratos.  El sitio web www.goodrx.com tiene cupones para medicamentos de Health and safety inspector. Los precios aqu no tienen en cuenta lo que podra costar con la ayuda del seguro (puede ser ms barato con su seguro), pero el sitio web puede darle el precio si no utiliz Tourist information centre manager.  - Puede imprimir el cupn correspondiente y llevarlo con su receta a la farmacia.  - Tambin puede pasar por nuestra oficina durante el horario de atencin regular y Education officer, museum una tarjeta de cupones de GoodRx.  - Si necesita que su receta se enve electrnicamente a una farmacia diferente, informe a nuestra oficina a travs de MyChart de [place] o por telfono llamando al [phone] y presione la opcin 4.

## 2023-08-03 ENCOUNTER — Other Ambulatory Visit: Payer: Self-pay | Admitting: Dermatology

## 2023-08-03 DIAGNOSIS — I7300 Raynaud's syndrome without gangrene: Secondary | ICD-10-CM

## 2023-08-31 DIAGNOSIS — Z7960 Long term (current) use of unspecified immunomodulators and immunosuppressants: Secondary | ICD-10-CM | POA: Diagnosis not present

## 2023-08-31 DIAGNOSIS — L98491 Non-pressure chronic ulcer of skin of other sites limited to breakdown of skin: Secondary | ICD-10-CM | POA: Diagnosis not present

## 2023-08-31 DIAGNOSIS — M349 Systemic sclerosis, unspecified: Secondary | ICD-10-CM | POA: Diagnosis not present

## 2023-10-26 DIAGNOSIS — M545 Low back pain, unspecified: Secondary | ICD-10-CM | POA: Diagnosis not present

## 2023-10-26 DIAGNOSIS — M4854XA Collapsed vertebra, not elsewhere classified, thoracic region, initial encounter for fracture: Secondary | ICD-10-CM | POA: Diagnosis not present

## 2023-10-26 DIAGNOSIS — M47816 Spondylosis without myelopathy or radiculopathy, lumbar region: Secondary | ICD-10-CM | POA: Diagnosis not present

## 2023-11-02 ENCOUNTER — Other Ambulatory Visit: Payer: Self-pay | Admitting: Dermatology

## 2023-11-02 ENCOUNTER — Other Ambulatory Visit: Payer: Self-pay

## 2023-11-02 DIAGNOSIS — I7300 Raynaud's syndrome without gangrene: Secondary | ICD-10-CM

## 2023-11-02 MED ORDER — SILDENAFIL CITRATE 20 MG PO TABS: ORAL_TABLET | ORAL | 0 refills | Status: DC

## 2023-11-02 NOTE — Progress Notes (Signed)
Refill request

## 2023-11-03 DIAGNOSIS — M545 Low back pain, unspecified: Secondary | ICD-10-CM | POA: Diagnosis not present

## 2023-11-23 DIAGNOSIS — H43813 Vitreous degeneration, bilateral: Secondary | ICD-10-CM | POA: Diagnosis not present

## 2023-11-23 DIAGNOSIS — Z79899 Other long term (current) drug therapy: Secondary | ICD-10-CM | POA: Diagnosis not present

## 2023-11-23 DIAGNOSIS — H2513 Age-related nuclear cataract, bilateral: Secondary | ICD-10-CM | POA: Diagnosis not present

## 2023-11-23 DIAGNOSIS — M349 Systemic sclerosis, unspecified: Secondary | ICD-10-CM | POA: Diagnosis not present

## 2023-12-10 DIAGNOSIS — R8290 Unspecified abnormal findings in urine: Secondary | ICD-10-CM | POA: Diagnosis not present

## 2023-12-10 DIAGNOSIS — M79674 Pain in right toe(s): Secondary | ICD-10-CM | POA: Diagnosis not present

## 2023-12-10 DIAGNOSIS — I10 Essential (primary) hypertension: Secondary | ICD-10-CM | POA: Diagnosis not present

## 2023-12-10 DIAGNOSIS — R809 Proteinuria, unspecified: Secondary | ICD-10-CM | POA: Diagnosis not present

## 2023-12-10 DIAGNOSIS — B351 Tinea unguium: Secondary | ICD-10-CM | POA: Diagnosis not present

## 2023-12-10 DIAGNOSIS — M79675 Pain in left toe(s): Secondary | ICD-10-CM | POA: Diagnosis not present

## 2023-12-10 DIAGNOSIS — D649 Anemia, unspecified: Secondary | ICD-10-CM | POA: Diagnosis not present

## 2023-12-11 ENCOUNTER — Other Ambulatory Visit: Payer: Self-pay | Admitting: Nephrology

## 2023-12-11 DIAGNOSIS — R809 Proteinuria, unspecified: Secondary | ICD-10-CM

## 2023-12-11 DIAGNOSIS — R8290 Unspecified abnormal findings in urine: Secondary | ICD-10-CM

## 2023-12-11 DIAGNOSIS — D649 Anemia, unspecified: Secondary | ICD-10-CM

## 2023-12-15 DIAGNOSIS — Z136 Encounter for screening for cardiovascular disorders: Secondary | ICD-10-CM | POA: Diagnosis not present

## 2023-12-15 DIAGNOSIS — E039 Hypothyroidism, unspecified: Secondary | ICD-10-CM | POA: Diagnosis not present

## 2023-12-15 DIAGNOSIS — I10 Essential (primary) hypertension: Secondary | ICD-10-CM | POA: Diagnosis not present

## 2023-12-15 DIAGNOSIS — Z862 Personal history of diseases of the blood and blood-forming organs and certain disorders involving the immune mechanism: Secondary | ICD-10-CM | POA: Diagnosis not present

## 2023-12-17 ENCOUNTER — Ambulatory Visit
Admission: RE | Admit: 2023-12-17 | Discharge: 2023-12-17 | Disposition: A | Discharge status: Home / Self Care | Payer: PPO | Source: Ambulatory Visit | Attending: Nephrology | Admitting: Nephrology

## 2023-12-17 DIAGNOSIS — R8290 Unspecified abnormal findings in urine: Secondary | ICD-10-CM | POA: Insufficient documentation

## 2023-12-17 DIAGNOSIS — D649 Anemia, unspecified: Secondary | ICD-10-CM | POA: Insufficient documentation

## 2023-12-17 DIAGNOSIS — R809 Proteinuria, unspecified: Secondary | ICD-10-CM | POA: Insufficient documentation

## 2023-12-17 DIAGNOSIS — N281 Cyst of kidney, acquired: Secondary | ICD-10-CM | POA: Diagnosis not present

## 2023-12-24 ENCOUNTER — Encounter: Payer: Self-pay | Admitting: Oncology

## 2023-12-24 ENCOUNTER — Inpatient Hospital Stay: Payer: PPO | Attending: Oncology | Admitting: Oncology

## 2023-12-24 ENCOUNTER — Inpatient Hospital Stay: Payer: PPO

## 2023-12-24 VITALS — BP 135/76 | Temp 97.6°F | Resp 16 | Ht 62.0 in | Wt 119.0 lb

## 2023-12-24 DIAGNOSIS — D72829 Elevated white blood cell count, unspecified: Secondary | ICD-10-CM | POA: Insufficient documentation

## 2023-12-24 DIAGNOSIS — D649 Anemia, unspecified: Secondary | ICD-10-CM | POA: Insufficient documentation

## 2023-12-24 DIAGNOSIS — D472 Monoclonal gammopathy: Secondary | ICD-10-CM | POA: Insufficient documentation

## 2023-12-24 DIAGNOSIS — W010XXA Fall on same level from slipping, tripping and stumbling without subsequent striking against object, initial encounter: Secondary | ICD-10-CM | POA: Diagnosis not present

## 2023-12-24 DIAGNOSIS — S6991XA Unspecified injury of right wrist, hand and finger(s), initial encounter: Secondary | ICD-10-CM | POA: Diagnosis not present

## 2023-12-24 DIAGNOSIS — Y92009 Unspecified place in unspecified non-institutional (private) residence as the place of occurrence of the external cause: Secondary | ICD-10-CM | POA: Diagnosis not present

## 2023-12-24 DIAGNOSIS — N2889 Other specified disorders of kidney and ureter: Secondary | ICD-10-CM

## 2023-12-24 DIAGNOSIS — N281 Cyst of kidney, acquired: Secondary | ICD-10-CM | POA: Diagnosis not present

## 2023-12-24 DIAGNOSIS — S52501A Unspecified fracture of the lower end of right radius, initial encounter for closed fracture: Secondary | ICD-10-CM | POA: Diagnosis not present

## 2023-12-24 LAB — CBC (CANCER CENTER ONLY)
HCT: 33.2 % — ABNORMAL LOW (ref 36.0–46.0)
Hemoglobin: 10.4 g/dL — ABNORMAL LOW (ref 12.0–15.0)
MCH: 26 pg (ref 26.0–34.0)
MCHC: 31.3 g/dL (ref 30.0–36.0)
MCV: 83 fL (ref 80.0–100.0)
Platelet Count: 266 10*3/uL (ref 150–400)
RBC: 4 MIL/uL (ref 3.87–5.11)
RDW: 18.5 % — ABNORMAL HIGH (ref 11.5–15.5)
WBC Count: 10.8 10*3/uL — ABNORMAL HIGH (ref 4.0–10.5)
nRBC: 0 % (ref 0.0–0.2)

## 2023-12-24 LAB — BASIC METABOLIC PANEL - CANCER CENTER ONLY
Anion gap: 11 (ref 5–15)
BUN: 19 mg/dL (ref 8–23)
CO2: 24 mmol/L (ref 22–32)
Calcium: 9.2 mg/dL (ref 8.9–10.3)
Chloride: 103 mmol/L (ref 98–111)
Creatinine: 0.64 mg/dL (ref 0.44–1.00)
GFR, Estimated: >60 mL/min (ref >60)
Glucose, Bld: 115 mg/dL — ABNORMAL HIGH (ref 70–99)
Potassium: 3.9 mmol/L (ref 3.5–5.1)
Sodium: 138 mmol/L (ref 135–145)

## 2023-12-24 NOTE — Progress Notes (Signed)
 [facility]  [phone] [phone]  ID: [name] OB: 07-29-1946  MR#: [id number]  RDW#:[id number]  Patient Care Team: Alla Amis, MD as PCP - General (Family Medicine)  CHIEF COMPLAINT: MGUS, renal cyst.  INTERVAL HISTORY: Patient is a 78 year old female who was noted to have a mildly elevated kappa free light chain on routine blood work.  She also recently had a renal ultrasound that revealed a renal cyst.  She currently feels at her baseline.  She offers no neurologic complaints.  She denies any recent fevers or illnesses.  She has a good appetite and denies weight loss.  She has no chest pain, shortness of breath, cough, or hemoptysis.  She denies any nausea, vomiting, constipation, or diarrhea.  She has no melena or hematochezia.  She has no urinary complaints.  Patient offers no specific complaints today.  REVIEW OF SYSTEMS:   Review of Systems  Constitutional: Negative.  Negative for fever, malaise/fatigue and weight loss.  Respiratory: Negative.  Negative for cough, hemoptysis and shortness of breath.   Cardiovascular: Negative.  Negative for chest pain and leg swelling.  Gastrointestinal: Negative.  Negative for abdominal pain.  Genitourinary: Negative.  Negative for dysuria and hematuria.  Musculoskeletal: Negative.  Negative for joint pain and myalgias.  Skin: Negative.  Negative for rash.  Neurological: Negative.  Negative for dizziness, focal weakness, weakness and headaches.  Psychiatric/Behavioral: Negative.  The patient is not nervous/anxious.     As per HPI. Otherwise, a complete review of systems is negative.  PAST MEDICAL HISTORY: Past Medical History:  Diagnosis Date   Actinic keratosis 09/01/2019   right cheek   Actinic keratosis 10/20/2019   left lat shin   Adenomatous colon polyp    Anemia    Aortic valve stenosis 04/22/2021   a.) 04/22/2021: EF 55%, mild AS (MPG 12.2 mmHg)   Arthritis    Basal cell  carcinoma 07/09/2018   left upper forehead   Basal cell carcinoma 09/01/2019   nasal dorsum   Basal cell carcinoma 12/11/2022   R pretibial, EDC 01/13/23   Basal cell carcinoma 07/06/2023   L pretibia. 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   a.) s/p PPM placement in 10/2010; PG changed 10/29/2021   Diastolic dysfunction 02/27/2016   a.) TTE 02/26/2017: EF 55%, mild LVH, mild LAE, mod RVE, triv AR/PR, mild MR, mod TR, RVSP 63.4, G1DD; b.) TTE 03/02/2017: EF >55%, mod RVE, triv AR, mild MR/PR, mod TR, RVSP 48; G1DD; c.) TTE 04/22/2021: EF 55%, mild LVH, mild MAC, mild LAE, triv PR, mild MR, mod TR, mild AS (MPG 12.2), RVSP 43, G1DD   Dysplastic nevus 10/20/2019   right sup chest/moderate, limited margins free   Dysplastic nevus 08/27/2022   right flank lateral to breast, moderate   GERD (gastroesophageal reflux disease)    Hypertension    Hypothyroidism    LBBB (left bundle branch block)    Long term current use of immunosuppressive drug    a.) MTX for scleroderma Dx   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   Pulmonary HTN (HCC) 02/27/2016   a.) TTE 12/15/2005: EF >55%, RVSP 33 mmHg; b.) TTE 02/26/2017: EF 55%, RVSP 63.4; c.) TTE 03/02/2017: EF >55%, RVSP 48; d.) TTE 04/22/2021: EF 55%, RVSP 43; e.) Tx'd with ACEi (analapril) + PDE5i (sildenafil )   Raynaud's disease  Scleroderma (HCC)    a.) FANA and  anti Ro Ab (+), mild interstitial change on CT, normal barium swallow, sclerodactyly with contracture, iflammatory arthritis, mild pulmonary hypertension; b.) on long term Tx using oral MTX   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 ENDOSCOPY;  Service: Endoscopy;  Laterality: N/A;  Pacemaker; Diffuse Scleroderma   COLONOSCOPY N/A 12/20/2001   COLONOSCOPY N/A 07/12/2004   COLONOSCOPY N/A 07/04/2011   ESOPHAGOGASTRODUODENOSCOPY N/A 08/11/2007   EXCISION PARTIAL PHALANX Left 06/06/2022   Procedure: EXCISION PARTIAL PHALANX -[id number];  Surgeon: Neill Boas, DPM;  Location: [facility] ORS;  Service: Podiatry;  Laterality: Left;   PACEMAKER INSERTION N/A 10/2010   PPM GENERATOR CHANGEOUT N/A 10/29/2021   Procedure: PPM GENERATOR CHANGEOUT;  Surgeon: Ammon Blunt, MD;  Location: [facility] INVASIVE CV LAB;  Service: Cardiovascular;  Laterality: N/A;   TUBAL LIGATION Bilateral 1979    FAMILY HISTORY: Family History  Problem Relation Age of Onset   Breast cancer Neg Hx     ADVANCED DIRECTIVES (Y/N):  N  HEALTH MAINTENANCE: Social History   Tobacco Use   Smoking status: Never   Smokeless tobacco: Never  Vaping Use   Vaping status: Never Used  Substance Use Topics   Alcohol use: Never   Drug use: Never     Colonoscopy:  PAP:  Bone density:  Lipid panel:  Allergies  Allergen Reactions   Penicillins Rash   Sulfa Antibiotics Rash    Current Outpatient Medications  Medication Sig Dispense Refill   amLODipine (NORVASC) 10 MG tablet Take 10 mg by mouth every morning.     aspirin EC 81 MG tablet Take 81 mg by mouth daily.     calcium carbonate (TUMS - DOSED IN MG ELEMENTAL CALCIUM) 500 MG chewable tablet Chew 500 mg by mouth daily as needed for indigestion or heartburn.     Calcium Citrate-Vitamin D (CALCIUM + D PO) Take 1 tablet by mouth daily. 600 mg     cholecalciferol (VITAMIN D3) 25 MCG (1000 UNIT) tablet Take 1,000 Units by mouth daily.     enalapril (VASOTEC) 2.5 MG tablet Take 2.5 mg by mouth every morning.     fluorouracil  (EFUDEX ) 5 % cream Apply  topically 2 (two) times daily. For 1 week to nose as directed 40 g 0   folic acid  (FOLVITE ) 800 MCG tablet Take 800 mcg by mouth daily.     hydrALAZINE (APRESOLINE) 50 MG tablet Take 50 mg by mouth 3 (three) times daily.     hydroxychloroquine (PLAQUENIL) 200 MG tablet Take by mouth.     levothyroxine (SYNTHROID, LEVOTHROID) 75 MCG tablet Take 75 mcg by mouth daily before breakfast.     methotrexate (RHEUMATREX) 2.5 MG tablet Take 15 mg by mouth every Monday.     Multiple Vitamin (MULTIVITAMIN WITH MINERALS) TABS tablet Take 1 tablet by mouth daily. Centrum Silver     omeprazole (PRILOSEC) 20 MG capsule Take 20 mg by mouth every morning.     sildenafil  (REVATIO ) 20 MG tablet TAKE ONE-HALF TABLET BY MOUTH THREE TIMES DAILY 135 tablet  0   vitamin B-12 (CYANOCOBALAMIN ) 1000 MCG tablet Take 1,000 mcg by mouth daily.     ibuprofen (ADVIL) 200 MG tablet Take 600 mg by mouth daily. (Patient not taking: Reported on 12/24/2023)     No current facility-administered medications for this visit.    OBJECTIVE: Vitals:   12/24/23 1455  BP: 135/76  Resp: 16  Temp: 97.6 F (36.4 C)     Body mass index is 21.77 kg/m.    ECOG FS:0 - Asymptomatic  General: Well-developed, well-nourished, no acute distress. Eyes: Pink conjunctiva, anicteric sclera. HEENT: Normocephalic, moist mucous membranes. Lungs: No audible wheezing or coughing. Heart: Regular rate and rhythm. Abdomen: Soft, nontender, no obvious distention. Musculoskeletal: No edema, cyanosis, or clubbing. Neuro: Alert, answering all questions appropriately. Cranial nerves grossly intact. Skin: No rashes or petechiae noted. Psych: Normal affect. Lymphatics: No cervical, calvicular, axillary or inguinal LAD.   LAB RESULTS:  Lab Results  Component Value Date   NA 138 12/24/2023   K 3.9 12/24/2023   CL 103 12/24/2023   CO2 24 12/24/2023   GLUCOSE 115 (H) 12/24/2023   BUN 19 12/24/2023   CREATININE 0.64 12/24/2023   CALCIUM 9.2 12/24/2023    GFRNONAA >60 12/24/2023    Lab Results  Component Value Date   WBC 10.8 (H) 12/24/2023   HGB 10.4 (L) 12/24/2023   HCT 33.2 (L) 12/24/2023   MCV 83.0 12/24/2023   PLT 266 12/24/2023     STUDIES: US  RENAL Result Date: 12/18/2023 : PROCEDURE: US  RENAL HISTORY: Patient is a 78 y/o F with anemia/abnormal urine/proteinuria. COMPARISON: None available. TECHNIQUE: Two-dimensional grayscale and color Doppler ultrasound of the kidneys was performed. FINDINGS: The urinary bladder demonstrates normal anechoic echogenicity. The bilateral ureteral jets are visualized. The right kidney measures 11.4 x 5.4 x 4.4 cm. Renal cortical echotexture is mildly increased. There is no hydronephrosis. There are no stones. There are no cysts. The left kidney measures 11.0 x 5.4 x 4.3 cm. Renal cortical echotexture is mildly increased. There is no hydronephrosis. There is a possible stone measuring 0.6 cm at the midpole. There are multiple cysts with the largest, a complex cyst with calcified rim, measuring 2.5 x 2.2 cm at the superior pole. IMPRESSION: 1. Increased echogenicity of the bilateral renal cortices with left renal cysts, consistent with medical renal disease. 2.  Possible non-obstructing left renal calculus. Thank you for allowing us  to assist in the care of this patient. Electronically Signed   By: Lynwood Mains M.D.   On: 12/18/2023 07:29    ASSESSMENT: MGUS, renal cyst.  PLAN:    MGUS: Patient was previously noted to have a mildly elevated kappa free light chain.  Repeat laboratory work including immunoglobulins and SPEP are pending at time of dictation.  No intervention is needed.  Patient does not require bone marrow biopsy.  Return to clinic in approximately 2 weeks for further evaluation and discussion of her laboratory results. Anemia: Mild.  Patient's hemoglobin is 10.7 today.  Can consider full workup including iron stores in the future. Leukocytosis: Mild, monitor. Renal cyst: Likely benign,  but will get renal protocol CT to further evaluate.  Return to clinic in approximately 2 weeks as above.  Patient expressed understanding and was in agreement with this plan. She also understands that She can call clinic at any time with any questions, concerns, or complaints.    Cancer Staging  No matching staging information was found for the patient.   Evalene JINNY Reusing, MD  12/24/2023 4:20 PM

## 2023-12-25 LAB — KAPPA/LAMBDA LIGHT CHAINS
Kappa free light chain: 48.5 mg/L — ABNORMAL HIGH (ref 3.3–19.4)
Kappa, lambda light chain ratio: 1.63 (ref 0.26–1.65)
Lambda free light chains: 29.8 mg/L — ABNORMAL HIGH (ref 5.7–26.3)

## 2023-12-26 LAB — IGG, IGA, IGM
IgA: 336 mg/dL (ref 64–422)
IgG (Immunoglobin G), Serum: 1222 mg/dL (ref 586–1602)
IgM (Immunoglobulin M), Srm: 128 mg/dL (ref 26–217)

## 2023-12-26 LAB — BETA 2 MICROGLOBULIN, SERUM: Beta-2 Microglobulin: 2.8 mg/L — ABNORMAL HIGH (ref 0.6–2.4)

## 2023-12-29 LAB — PROTEIN ELECTROPHORESIS, SERUM
A/G Ratio: 1.2 (ref 0.7–1.7)
Albumin ELP: 4.1 g/dL (ref 2.9–4.4)
Alpha-1-Globulin: 0.3 g/dL (ref 0.0–0.4)
Alpha-2-Globulin: 0.9 g/dL (ref 0.4–1.0)
Beta Globulin: 1.1 g/dL (ref 0.7–1.3)
Gamma Globulin: 1.2 g/dL (ref 0.4–1.8)
Globulin, Total: 3.5 g/dL (ref 2.2–3.9)
Total Protein ELP: 7.6 g/dL (ref 6.0–8.5)

## 2023-12-30 DIAGNOSIS — S52551A Other extraarticular fracture of lower end of right radius, initial encounter for closed fracture: Secondary | ICD-10-CM | POA: Diagnosis not present

## 2023-12-31 ENCOUNTER — Ambulatory Visit
Admission: RE | Admit: 2023-12-31 | Discharge: 2023-12-31 | Disposition: A | Discharge status: Home / Self Care | Payer: PPO | Source: Ambulatory Visit | Attending: Oncology | Admitting: Oncology

## 2023-12-31 DIAGNOSIS — N2889 Other specified disorders of kidney and ureter: Secondary | ICD-10-CM | POA: Insufficient documentation

## 2023-12-31 DIAGNOSIS — N281 Cyst of kidney, acquired: Secondary | ICD-10-CM | POA: Insufficient documentation

## 2023-12-31 DIAGNOSIS — I722 Aneurysm of renal artery: Secondary | ICD-10-CM | POA: Diagnosis not present

## 2023-12-31 MED ORDER — IOHEXOL 300 MG/ML  SOLN
100.0000 mL | Freq: Once | INTRAMUSCULAR | Status: AC | PRN
  Administered 2023-12-31: 100 mL via INTRAVENOUS

## 2024-01-04 DIAGNOSIS — M349 Systemic sclerosis, unspecified: Secondary | ICD-10-CM | POA: Diagnosis not present

## 2024-01-04 DIAGNOSIS — Z7960 Long term (current) use of unspecified immunomodulators and immunosuppressants: Secondary | ICD-10-CM | POA: Diagnosis not present

## 2024-01-07 ENCOUNTER — Ambulatory Visit: Payer: Medicare HMO | Admitting: Dermatology

## 2024-01-08 ENCOUNTER — Inpatient Hospital Stay: Payer: PPO | Admitting: Oncology

## 2024-01-08 ENCOUNTER — Encounter: Payer: Self-pay | Admitting: Oncology

## 2024-01-08 VITALS — BP 142/67 | HR 76 | Temp 97.2°F | Resp 16 | Ht 62.0 in | Wt 118.0 lb

## 2024-01-08 DIAGNOSIS — D649 Anemia, unspecified: Secondary | ICD-10-CM | POA: Diagnosis not present

## 2024-01-08 DIAGNOSIS — D472 Monoclonal gammopathy: Secondary | ICD-10-CM | POA: Diagnosis not present

## 2024-01-08 NOTE — Progress Notes (Signed)
 [facility]  [phone] [phone]  ID: [name] OB: 07-24-1946  MR#: [id number]  AOZ#:[id number]  Patient Care Team: Marisue Ivan, MD as PCP - General (Family Medicine)  CHIEF COMPLAINT: Anemia, unspecified.  Elevated kappa/lambda light chains.  INTERVAL HISTORY: Patient returns to clinic today for further evaluation and discussion of her imaging and laboratory results.  She continues to feel well and is at her baseline.  She has no neurologic complaints.  She denies any recent fevers or illnesses.  She has a good appetite and denies weight loss.  She has no chest pain, shortness of breath, cough, or hemoptysis.  She denies any nausea, vomiting, constipation, or diarrhea.  She has no melena or hematochezia.  She has no urinary complaints.  Patient offers no specific complaints today.  REVIEW OF SYSTEMS:   Review of Systems  Constitutional: Negative.  Negative for fever, malaise/fatigue and weight loss.  Respiratory: Negative.  Negative for cough, hemoptysis and shortness of breath.   Cardiovascular: Negative.  Negative for chest pain and leg swelling.  Gastrointestinal: Negative.  Negative for abdominal pain.  Genitourinary: Negative.  Negative for dysuria and hematuria.  Musculoskeletal: Negative.  Negative for joint pain and myalgias.  Skin: Negative.  Negative for rash.  Neurological: Negative.  Negative for dizziness, focal weakness, weakness and headaches.  Psychiatric/Behavioral: Negative.  The patient is not nervous/anxious.     As per HPI. Otherwise, a complete review of systems is negative.  PAST MEDICAL HISTORY: Past Medical History:  Diagnosis Date   Actinic keratosis 09/01/2019   right cheek   Actinic keratosis 10/20/2019   left lat shin   Adenomatous colon polyp    Anemia    Aortic valve stenosis 04/22/2021   a.) 04/22/2021: EF 55%, mild AS (MPG 12.2 mmHg)   Arthritis    Basal cell carcinoma 07/09/2018   left  upper forehead   Basal cell carcinoma 09/01/2019   nasal dorsum   Basal cell carcinoma 12/11/2022   R pretibial, ED[facility] 01/13/23   Basal cell carcinoma 07/06/2023   L pretibia. 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   a.) s/p PPM placement in 10/2010; PG changed 10/29/2021   Diastolic dysfunction 02/27/2016   a.) TTE 02/26/2017: EF 55%, mild LVH, mild LAE, mod RVE, triv AR/PR, mild MR, mod TR, RVSP 63.4, G1DD; b.) TTE 03/02/2017: EF >55%, mod RVE, triv AR, mild MR/PR, mod TR, RVSP 48; G1DD; c.) TTE 04/22/2021: EF 55%, mild LVH, mild MAC, mild LAE, triv PR, mild MR, mod TR, mild AS (MPG 12.2), RVSP 43, G1DD   Dysplastic nevus 10/20/2019   right sup chest/moderate, limited margins free   Dysplastic nevus 08/27/2022   right flank lateral to breast, moderate   GERD (gastroesophageal reflux disease)    Hypertension    Hypothyroidism    LBBB (left bundle branch block)    Long term current use of immunosuppressive drug    a.) MTX for scleroderma Dx   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   Pulmonary HTN (HCC) 02/27/2016   a.) TTE 12/15/2005: EF >55%, RVSP 33 mmHg; b.) TTE 02/26/2017: EF 55%, RVSP 63.4; c.) TTE 03/02/2017: EF >55%, RVSP 48; d.) TTE 04/22/2021: EF 55%, RVSP 43; e.) Tx'd with ACEi (analapril) + PDE5i (sildenafil)   Raynaud's disease    Scleroderma (HCC)    a.) FANA and  anti  Ro Ab (+), mild interstitial change on CT, normal barium swallow, sclerodactyly with contracture, iflammatory arthritis, mild pulmonary hypertension; b.) on long term Tx using oral MTX   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   right cheek Moh's 01/08/2021   SSS (sick sinus syndrome) (HCC) 10/2010   a.) s/p PPM placement in 10/2010; PG changed 10/29/2021   Vitamin B 12 deficiency    Vitamin D deficiency     PAST SURGICAL HISTORY: Past Surgical History:  Procedure Laterality Date    ABDOMINAL HYSTERECTOMY N/A    COLONOSCOPY N/A 09/22/2016   Procedure: COLONOSCOPY;  Surgeon: Scot Jun, MD;  Location: [facility] ENDOSCOPY;  Service: Endoscopy;  Laterality: N/A;  Pacemaker; Diffuse Scleroderma   COLONOSCOPY N/A 12/20/2001   COLONOSCOPY N/A 07/12/2004   COLONOSCOPY N/A 07/04/2011   ESOPHAGOGASTRODUODENOSCOPY N/A 08/11/2007   EXCISION PARTIAL PHALANX Left 06/06/2022   Procedure: EXCISION PARTIAL PHALANX -[id number];  Surgeon: Linus Galas, DPM;  Location: [facility] ORS;  Service: Podiatry;  Laterality: Left;   PACEMAKER INSERTION N/A 10/2010   PPM GENERATOR CHANGEOUT N/A 10/29/2021   Procedure: PPM GENERATOR CHANGEOUT;  Surgeon: Marcina Millard, MD;  Location: [facility] INVASIVE CV LAB;  Service: Cardiovascular;  Laterality: N/A;   TUBAL LIGATION Bilateral 1979    FAMILY HISTORY: Family History  Problem Relation Age of Onset   Breast cancer Neg Hx     ADVANCED DIRECTIVES (Y/N):  N  HEALTH MAINTENANCE: Social History   Tobacco Use   Smoking status: Never   Smokeless tobacco: Never  Vaping Use   Vaping status: Never Used  Substance Use Topics   Alcohol use: Never   Drug use: Never     Colonoscopy:  PAP:  Bone density:  Lipid panel:  Allergies  Allergen Reactions   Penicillins Rash   Sulfa Antibiotics Rash    Current Outpatient Medications  Medication Sig Dispense Refill   amLODipine (NORVASC) 10 MG tablet Take 10 mg by mouth every morning.     aspirin EC 81 MG tablet Take 81 mg by mouth daily.     calcium carbonate (TUMS - DOSED IN MG ELEMENTAL CALCIUM) 500 MG chewable tablet Chew 500 mg by mouth daily as needed for indigestion or heartburn.     Calcium Citrate-Vitamin D (CALCIUM + D PO) Take 1 tablet by mouth daily. 600 mg     cholecalciferol (VITAMIN D3) 25 MCG (1000 UNIT) tablet Take 1,000 Units by mouth daily.     enalapril (VASOTEC) 2.5 MG tablet Take 2.5 mg by mouth every morning.     fluorouracil (EFUDEX) 5 % cream Apply topically 2 (two) times  daily. For 1 week to nose as directed 40 g 0   folic acid (FOLVITE) 800 MCG tablet Take 800 mcg by mouth daily.     hydrALAZINE (APRESOLINE) 50 MG tablet Take 50 mg by mouth 3 (three) times daily.     hydroxychloroquine (PLAQUENIL) 200 MG tablet Take by mouth.     levothyroxine (SYNTHROID, LEVOTHROID) 75 MCG tablet Take 75 mcg by mouth daily before breakfast.     methotrexate (RHEUMATREX) 2.5 MG tablet Take 15 mg by mouth every Monday.     Multiple Vitamin (MULTIVITAMIN WITH MINERALS) TABS tablet Take 1 tablet by mouth daily. Centrum Silver     omeprazole (PRILOSEC) 20 MG capsule Take 20 mg by mouth every morning.     sildenafil (REVATIO) 20 MG tablet TAKE ONE-HALF TABLET BY MOUTH THREE TIMES DAILY 135 tablet 0   vitamin B-12 (CYANOCOBALAMIN) 1000 MCG tablet Take  1,000 mcg by mouth daily.     ibuprofen (ADVIL) 200 MG tablet Take 600 mg by mouth daily. (Patient not taking: Reported on 12/24/2023)     No current facility-administered medications for this visit.    OBJECTIVE: Vitals:   01/08/24 1051  BP: (!) 142/67  Pulse: 76  Resp: 16  Temp: (!) 97.2 F (36.2 C)  SpO2: 100%      Body mass index is 21.58 kg/m.    ECOG FS:0 - Asymptomatic  General: Well-developed, well-nourished, no acute distress. Eyes: Pink conjunctiva, anicteric sclera. HEENT: Normocephalic, moist mucous membranes. Lungs: No audible wheezing or coughing. Heart: Regular rate and rhythm. Abdomen: Soft, nontender, no obvious distention. Musculoskeletal: No edema, cyanosis, or clubbing. Neuro: Alert, answering all questions appropriately. Cranial nerves grossly intact. Skin: No rashes or petechiae noted. Psych: Normal affect.  LAB RESULTS:  Lab Results  Component Value Date   NA 138 12/24/2023   K 3.9 12/24/2023   CL 103 12/24/2023   CO2 24 12/24/2023   GLUCOSE 115 (H) 12/24/2023   BUN 19 12/24/2023   CREATININE 0.64 12/24/2023   CALCIUM 9.2 12/24/2023   GFRNONAA >60 12/24/2023    Lab Results   Component Value Date   WBC 10.8 (H) 12/24/2023   HGB 10.4 (L) 12/24/2023   HCT 33.2 (L) 12/24/2023   MCV 83.0 12/24/2023   PLT 266 12/24/2023     STUDIES: CT ABDOMEN W WO CONTRAST Result Date: 01/08/2024 CLINICAL DATA:  Indeterminate renal mass/cyst EXAM: CT ABDOMEN WITHOUT AND WITH CONTRAST TECHNIQUE: Multidetector CT imaging of the abdomen was performed following the standard protocol before and following the bolus administration of intravenous contrast. RADIATION DOSE REDUCTION: This exam was performed according to the departmental dose-optimization program which includes automated exposure control, adjustment of the mA and/or kV according to patient size and/or use of iterative reconstruction technique. CONTRAST:  OMNIPAQUE IOHEXOL 300 MG/ML  SOLN COMPARISON:  Renal ultrasound 12/17/2023 FINDINGS: Lower chest: Clustered bulla inferiorly in the right middle lobe. Dual lead pacer. Mitral valve and likely left ventricular papillary muscle calcification. Right coronary artery and descending thoracic aortic atherosclerotic vascular calcification. Mild cardiomegaly. Hepatobiliary: Unremarkable Pancreas: Unremarkable Spleen: Unremarkable Adrenals/Urinary Tract: Adrenal glands appear normal. 3.0 by 1.9 by 1.8 cm cystic lesion of the left kidney upper pole with a 2.0 cm cystic element with calcified margins centrally, a 1.8 by 1.1 cm cystic element laterally, and a 1.1 by 1.0 cm cystic element medially. The medial and central cystic lesions represent complex Bosniak category 2 cysts, the lateral element is a Bosniak category 1 cyst. Overall this represents a benign complex cystic lesion. No further imaging workup of this lesion is indicated. Stomach/Bowel: Unremarkable Vascular/Lymphatic: Atherosclerosis is present, including aortoiliac atherosclerotic disease. Atheromatous plaque at the origins of the celiac trunk and SMA without critical stenosis or overt occlusion. 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 Other: No supplemental non-categorized findings. Musculoskeletal: Subacute T12 compression fracture with nearly 50% loss of vertebral height, associated vertebral sclerosis, and 6 mm of posterior bony retropulsion likely causing mild central narrowing of the thecal sac the upper T12 level. Vacuum disc phenomenon with some of the nitrogen gas phenomenon tracking along the fracture plane of the superior endplate shown on image 67 series 12. IMPRESSION: 1. The left kidney upper pole cystic lesion is a benign complex cystic lesion (Bosniak category 2), no further imaging workup of this lesion is indicated. 2. Subacute T12 compression  fracture with nearly 50% loss of vertebral height, associated vertebral sclerosis, and 6 mm of posterior bony retropulsion likely causing mild central narrowing of the thecal sac at the upper T12 level. 3. 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 Interventional radiology consultation recommended. 4. Aortic and coronary atherosclerosis. Aortic Atherosclerosis (ICD10-I70.0). 5. Mild cardiomegaly with mitral valve calcification. 6. Clustered bulla inferiorly in the right middle lobe. Electronically Signed   By: Gaylyn Rong M.D.   On: 01/08/2024 10:41   US RENAL Result Date: 12/18/2023 : PROCEDURE: US RENAL HISTORY: Patient is a 78 y/o F with anemia/abnormal urine/proteinuria. COMPARISON: None available. TECHNIQUE: Two-dimensional grayscale and color Doppler ultrasound of the kidneys was performed. FINDINGS: The urinary bladder demonstrates normal anechoic echogenicity. The bilateral ureteral jets are visualized. The right kidney measures 11.4 x 5.4 x 4.4 cm. Renal cortical echotexture is mildly increased. There is no hydronephrosis. There are no stones. There are no cysts. The left kidney measures 11.0 x 5.4 x 4.3 cm. Renal cortical echotexture is mildly increased. There is no  hydronephrosis. There is a possible stone measuring 0.6 cm at the midpole. There are multiple cysts with the largest, a complex cyst with calcified rim, measuring 2.5 x 2.2 cm at the superior pole. IMPRESSION: 1. Increased echogenicity of the bilateral renal cortices with left renal cysts, consistent with medical renal disease. 2.  Possible non-obstructing left renal calculus. Thank you for allowing [place] to assist in the care of this patient. Electronically Signed   By: Lestine Box M.D.   On: 12/18/2023 07:29    ASSESSMENT: Anemia, unspecified.  Elevated kappa/lambda light chains.  PLAN:    Anemia, unspecified: Chronic and unchanged.  Patient's most recent hemoglobin is 10.7.  Will do full anemia workup in 6 months with next lab draw.   Elevated kappa/lambda light chains: Likely clinically insignificant.  Light chain ratio is within normal limits.  SPEP and immunoglobulins are negative.  Patient does not require bone marrow biopsy.  Repeat light chains with next lab draw in 6 months. Leukocytosis: Mild, monitor. Renal cyst: CT scan results from December 18, 2023 reviewed independently and report as above reporting benign left kidney cyst.  No further imaging is necessary. Disposition: Return to clinic in 6 months with repeat laboratory work and routine evaluation.  If everything remains stable, patient can likely be discharged from clinic at that time.  Patient expressed understanding and was in agreement with this plan. She also understands that She can call clinic at any time with any questions, concerns, or complaints.    Jeralyn Ruths, MD   01/08/2024 12:25 PM

## 2024-01-13 DIAGNOSIS — S52551D Other extraarticular fracture of lower end of right radius, subsequent encounter for closed fracture with routine healing: Secondary | ICD-10-CM | POA: Diagnosis not present

## 2024-01-14 DIAGNOSIS — M349 Systemic sclerosis, unspecified: Secondary | ICD-10-CM | POA: Diagnosis not present

## 2024-01-14 DIAGNOSIS — E538 Deficiency of other specified B group vitamins: Secondary | ICD-10-CM | POA: Diagnosis not present

## 2024-01-14 DIAGNOSIS — M8580 Other specified disorders of bone density and structure, unspecified site: Secondary | ICD-10-CM | POA: Diagnosis not present

## 2024-01-14 DIAGNOSIS — G8929 Other chronic pain: Secondary | ICD-10-CM | POA: Diagnosis not present

## 2024-01-14 DIAGNOSIS — D84821 Immunodeficiency due to drugs: Secondary | ICD-10-CM | POA: Diagnosis not present

## 2024-01-14 DIAGNOSIS — R011 Cardiac murmur, unspecified: Secondary | ICD-10-CM | POA: Diagnosis not present

## 2024-01-14 DIAGNOSIS — H259 Unspecified age-related cataract: Secondary | ICD-10-CM | POA: Diagnosis not present

## 2024-01-14 DIAGNOSIS — K219 Gastro-esophageal reflux disease without esophagitis: Secondary | ICD-10-CM | POA: Diagnosis not present

## 2024-01-14 DIAGNOSIS — E039 Hypothyroidism, unspecified: Secondary | ICD-10-CM | POA: Diagnosis not present

## 2024-01-14 DIAGNOSIS — I10 Essential (primary) hypertension: Secondary | ICD-10-CM | POA: Diagnosis not present

## 2024-01-14 DIAGNOSIS — I499 Cardiac arrhythmia, unspecified: Secondary | ICD-10-CM | POA: Diagnosis not present

## 2024-01-14 DIAGNOSIS — G63 Polyneuropathy in diseases classified elsewhere: Secondary | ICD-10-CM | POA: Diagnosis not present

## 2024-01-18 DIAGNOSIS — R809 Proteinuria, unspecified: Secondary | ICD-10-CM | POA: Diagnosis not present

## 2024-01-18 DIAGNOSIS — D649 Anemia, unspecified: Secondary | ICD-10-CM | POA: Diagnosis not present

## 2024-01-18 DIAGNOSIS — I10 Essential (primary) hypertension: Secondary | ICD-10-CM | POA: Diagnosis not present

## 2024-01-25 ENCOUNTER — Other Ambulatory Visit: Payer: Self-pay | Admitting: Dermatology

## 2024-01-25 DIAGNOSIS — I7300 Raynaud's syndrome without gangrene: Secondary | ICD-10-CM

## 2024-01-28 DIAGNOSIS — M349 Systemic sclerosis, unspecified: Secondary | ICD-10-CM | POA: Diagnosis not present

## 2024-01-28 DIAGNOSIS — I272 Pulmonary hypertension, unspecified: Secondary | ICD-10-CM | POA: Diagnosis not present

## 2024-01-28 DIAGNOSIS — I10 Essential (primary) hypertension: Secondary | ICD-10-CM | POA: Diagnosis not present

## 2024-01-28 DIAGNOSIS — Z950 Presence of cardiac pacemaker: Secondary | ICD-10-CM | POA: Diagnosis not present

## 2024-01-28 DIAGNOSIS — I495 Sick sinus syndrome: Secondary | ICD-10-CM | POA: Diagnosis not present

## 2024-01-28 DIAGNOSIS — Z8679 Personal history of other diseases of the circulatory system: Secondary | ICD-10-CM | POA: Diagnosis not present

## 2024-01-28 DIAGNOSIS — I2729 Other secondary pulmonary hypertension: Secondary | ICD-10-CM | POA: Diagnosis not present

## 2024-01-28 DIAGNOSIS — I350 Nonrheumatic aortic (valve) stenosis: Secondary | ICD-10-CM | POA: Diagnosis not present

## 2024-01-28 DIAGNOSIS — I38 Endocarditis, valve unspecified: Secondary | ICD-10-CM | POA: Diagnosis not present

## 2024-02-03 DIAGNOSIS — S52551D Other extraarticular fracture of lower end of right radius, subsequent encounter for closed fracture with routine healing: Secondary | ICD-10-CM | POA: Diagnosis not present

## 2024-02-03 DIAGNOSIS — S52691D Other fracture of lower end of right ulna, subsequent encounter for closed fracture with routine healing: Secondary | ICD-10-CM | POA: Diagnosis not present

## 2024-02-04 DIAGNOSIS — R1084 Generalized abdominal pain: Secondary | ICD-10-CM | POA: Diagnosis not present

## 2024-02-15 ENCOUNTER — Ambulatory Visit: Payer: Medicare HMO | Admitting: Dermatology

## 2024-02-15 ENCOUNTER — Encounter: Payer: Self-pay | Admitting: Dermatology

## 2024-02-15 DIAGNOSIS — L820 Inflamed seborrheic keratosis: Secondary | ICD-10-CM | POA: Diagnosis not present

## 2024-02-15 DIAGNOSIS — L570 Actinic keratosis: Secondary | ICD-10-CM

## 2024-02-15 DIAGNOSIS — W908XXA Exposure to other nonionizing radiation, initial encounter: Secondary | ICD-10-CM | POA: Diagnosis not present

## 2024-02-15 DIAGNOSIS — Z85828 Personal history of other malignant neoplasm of skin: Secondary | ICD-10-CM | POA: Diagnosis not present

## 2024-02-15 NOTE — Progress Notes (Signed)
 Follow-Up Visit   Subjective  [name] is a 78 y.o. female who presents for the following: 6 month AK follow up. Tx with LN2 07/06/2023. Right lateral cheek, left zygoma, right superior forehead.   Recheck BCC. Left pretibia. Bx: 07/06/2023. Tx with [facility] 07/29/2023  The patient has spots, moles and lesions to be evaluated, some may be new or changing and the patient may have concern these could be cancer.   The following portions of the chart were reviewed this encounter and updated as appropriate: medications, allergies, medical history  Review of Systems:  No other skin or systemic complaints except as noted in HPI or Assessment and Plan.  Objective  Well appearing patient in no apparent distress; mood and affect are within normal limits.  A focused examination was performed of the following areas: Face, left leg  Relevant exam findings are noted in the Assessment and Plan.  right medial cheek x1,  right lateral cheek x1 (2) Erythematous thin papules/macules with gritty scale.  Right Proximal Thigh - Anterior x1 Erythematous keratotic plaque.  Assessment & Plan   HISTORY OF BASAL CELL CARCINOMA OF THE SKIN. Left pretibia. Bx: 07/06/2023. Tx with [facility] 07/29/2023 - No evidence of recurrence today, clear on exam - Recommend regular full body skin exams - Recommend daily broad spectrum sunscreen SPF 30+ to sun-exposed areas, reapply every 2 hours as needed.  - Call if any new or changing lesions are noted between office visits   AK (ACTINIC KERATOSIS) (2) right medial cheek x1,  right lateral cheek x1 (2) Actinic keratoses are precancerous spots that appear secondary to cumulative UV radiation exposure/sun exposure over time. They are chronic with expected duration over 1 year. A portion of actinic keratoses will progress to squamous cell carcinoma of the skin. It is not possible to reliably predict which spots will progress to skin cancer and so treatment is recommended to  prevent development of skin cancer.  Recommend daily broad spectrum sunscreen SPF 30+ to sun-exposed areas, reapply every 2 hours as needed.  Recommend staying in the shade or wearing long sleeves, sun glasses (UVA+UVB protection) and wide brim hats (4-inch brim around the entire circumference of the hat). Call for new or changing lesions. Destruction of lesion - right medial cheek x1,  right lateral cheek x1 (2) Complexity: simple   Destruction method: cryotherapy   Informed consent: discussed and consent obtained   Timeout:  patient name, date of birth, surgical site, and procedure verified Lesion destroyed using liquid nitrogen: Yes   Region frozen until ice ball extended beyond lesion: Yes   Cryo cycles: 1 or 2. Outcome: patient tolerated procedure well with no complications   Post-procedure details: wound care instructions given   INFLAMED SEBORRHEIC KERATOSIS Right Proximal Thigh - Anterior x1 Frozen by Dr Neale Burly 02/2023. Recommended biopsy for possible skin cancer. Patient prefers to try cryotherapy. RTC 1 month if not resolved. Symptomatic, irritating, patient would like treated. Destruction of lesion - Right Proximal Thigh - Anterior x1 Complexity: simple   Destruction method: cryotherapy   Informed consent: discussed and consent obtained   Timeout:  patient name, date of birth, surgical site, and procedure verified Lesion destroyed using liquid nitrogen: Yes   Region frozen until ice ball extended beyond lesion: Yes   Cryo cycles: 1 or 2. Outcome: patient tolerated procedure well with no complications   Post-procedure details: wound care instructions given   Additional details:  Prior to procedure, discussed risks of blister formation, small wound,  skin dyspigmentation, or rare scar following cryotherapy. Recommend Vaseline ointment to treated areas while healing.   Return in about 6 months (around 08/16/2024) for TBSE, HxBCC, HxSCC, HxDN.  I, Lawson Radar, CMA, am acting as  scribe for Elie Goody, MD.   Documentation: I have reviewed the above documentation for accuracy and completeness, and I agree with the above.  Elie Goody, MD

## 2024-02-15 NOTE — Patient Instructions (Signed)

## 2024-03-10 DIAGNOSIS — E039 Hypothyroidism, unspecified: Secondary | ICD-10-CM | POA: Diagnosis not present

## 2024-03-10 DIAGNOSIS — Z862 Personal history of diseases of the blood and blood-forming organs and certain disorders involving the immune mechanism: Secondary | ICD-10-CM | POA: Diagnosis not present

## 2024-03-10 DIAGNOSIS — I10 Essential (primary) hypertension: Secondary | ICD-10-CM | POA: Diagnosis not present

## 2024-04-04 DIAGNOSIS — Z1331 Encounter for screening for depression: Secondary | ICD-10-CM | POA: Diagnosis not present

## 2024-04-04 DIAGNOSIS — M349 Systemic sclerosis, unspecified: Secondary | ICD-10-CM | POA: Diagnosis not present

## 2024-04-04 DIAGNOSIS — R0602 Shortness of breath: Secondary | ICD-10-CM | POA: Diagnosis not present

## 2024-04-15 DIAGNOSIS — M79675 Pain in left toe(s): Secondary | ICD-10-CM | POA: Diagnosis not present

## 2024-04-15 DIAGNOSIS — M79674 Pain in right toe(s): Secondary | ICD-10-CM | POA: Diagnosis not present

## 2024-04-15 DIAGNOSIS — B351 Tinea unguium: Secondary | ICD-10-CM | POA: Diagnosis not present

## 2024-05-02 ENCOUNTER — Other Ambulatory Visit: Payer: Self-pay | Admitting: Dermatology

## 2024-05-02 DIAGNOSIS — R809 Proteinuria, unspecified: Secondary | ICD-10-CM | POA: Diagnosis not present

## 2024-05-02 DIAGNOSIS — I7300 Raynaud's syndrome without gangrene: Secondary | ICD-10-CM

## 2024-05-02 DIAGNOSIS — D649 Anemia, unspecified: Secondary | ICD-10-CM | POA: Diagnosis not present

## 2024-05-02 DIAGNOSIS — I10 Essential (primary) hypertension: Secondary | ICD-10-CM | POA: Diagnosis not present

## 2024-05-03 DIAGNOSIS — M349 Systemic sclerosis, unspecified: Secondary | ICD-10-CM | POA: Diagnosis not present

## 2024-05-03 DIAGNOSIS — Z7960 Long term (current) use of unspecified immunomodulators and immunosuppressants: Secondary | ICD-10-CM | POA: Diagnosis not present

## 2024-05-05 ENCOUNTER — Other Ambulatory Visit: Payer: Self-pay | Admitting: Dermatology

## 2024-05-05 DIAGNOSIS — I7300 Raynaud's syndrome without gangrene: Secondary | ICD-10-CM

## 2024-05-10 NOTE — Telephone Encounter (Signed)
 Left voicemail for patient to return my call.

## 2024-05-11 DIAGNOSIS — I495 Sick sinus syndrome: Secondary | ICD-10-CM | POA: Diagnosis not present

## 2024-05-11 DIAGNOSIS — Z950 Presence of cardiac pacemaker: Secondary | ICD-10-CM | POA: Diagnosis not present

## 2024-07-07 ENCOUNTER — Inpatient Hospital Stay: Payer: PPO | Attending: Oncology

## 2024-07-07 DIAGNOSIS — Z862 Personal history of diseases of the blood and blood-forming organs and certain disorders involving the immune mechanism: Secondary | ICD-10-CM | POA: Insufficient documentation

## 2024-07-07 DIAGNOSIS — Z79899 Other long term (current) drug therapy: Secondary | ICD-10-CM | POA: Diagnosis not present

## 2024-07-07 DIAGNOSIS — N281 Cyst of kidney, acquired: Secondary | ICD-10-CM | POA: Diagnosis not present

## 2024-07-07 DIAGNOSIS — D649 Anemia, unspecified: Secondary | ICD-10-CM | POA: Diagnosis not present

## 2024-07-07 LAB — IRON AND TIBC
Iron: 83 ug/dL (ref 28–170)
Saturation Ratios: 19 % (ref 10.4–31.8)
TIBC: 440 ug/dL (ref 250–450)
UIBC: 357 ug/dL

## 2024-07-07 LAB — CBC (CANCER CENTER ONLY)
HCT: 30.8 % — ABNORMAL LOW (ref 36.0–46.0)
Hemoglobin: 9.7 g/dL — ABNORMAL LOW (ref 12.0–15.0)
MCH: 27 pg (ref 26.0–34.0)
MCHC: 31.5 g/dL (ref 30.0–36.0)
MCV: 85.8 fL (ref 80.0–100.0)
Platelet Count: 213 K/uL (ref 150–400)
RBC: 3.59 MIL/uL — ABNORMAL LOW (ref 3.87–5.11)
RDW: 18.1 % — ABNORMAL HIGH (ref 11.5–15.5)
WBC Count: 6.4 K/uL (ref 4.0–10.5)
nRBC: 0 % (ref 0.0–0.2)

## 2024-07-07 LAB — FERRITIN: Ferritin: 10 ng/mL — ABNORMAL LOW (ref 11–307)

## 2024-07-07 LAB — LACTATE DEHYDROGENASE: LDH: 157 U/L (ref 98–192)

## 2024-07-07 LAB — FOLATE: Folate: >40 ng/mL (ref >5.9)

## 2024-07-07 LAB — VITAMIN B12: Vitamin B-12: 1043 pg/mL — ABNORMAL HIGH (ref 180–914)

## 2024-07-08 LAB — KAPPA/LAMBDA LIGHT CHAINS
Kappa free light chain: 43.5 mg/L — ABNORMAL HIGH (ref 3.3–19.4)
Kappa, lambda light chain ratio: 1.38 (ref 0.26–1.65)
Lambda free light chains: 31.5 mg/L — ABNORMAL HIGH (ref 5.7–26.3)

## 2024-07-08 LAB — HAPTOGLOBIN: Haptoglobin: 165 mg/dL (ref 42–346)

## 2024-07-12 LAB — COMP PANEL: LEUKEMIA/LYMPHOMA

## 2024-07-12 LAB — HGB FRACTIONATION CASCADE
Hgb A2: 2 % (ref 1.8–3.2)
Hgb A: 98 % (ref 96.4–98.8)
Hgb F: 0 % (ref 0.0–2.0)
Hgb S: 0 %

## 2024-07-14 ENCOUNTER — Inpatient Hospital Stay: Payer: PPO | Admitting: Oncology

## 2024-07-14 ENCOUNTER — Encounter: Payer: Self-pay | Admitting: Oncology

## 2024-07-14 VITALS — BP 134/68 | HR 79 | Temp 97.3°F | Resp 18 | Wt 117.0 lb

## 2024-07-14 DIAGNOSIS — D649 Anemia, unspecified: Secondary | ICD-10-CM

## 2024-07-14 NOTE — Progress Notes (Unsigned)
 Patient is doing ok, no new questions for the doctor today.

## 2024-07-14 NOTE — Progress Notes (Unsigned)
 [facility]  [phone] [phone]  ID: [name] OB: May 30, 1946  MR#: [id number]  RDW#:[id number]  Patient Care Team: Alla Amis, MD as PCP - General (Family Medicine)  CHIEF COMPLAINT: Anemia, unspecified.  Elevated kappa/lambda light chains.  INTERVAL HISTORY: Patient returns to clinic today for routine 39-month evaluation and discussion of her laboratory results.  She continues to feel well and remains asymptomatic.  She has no neurologic complaints.  She denies any recent fevers or illnesses.  She has a good appetite and denies weight loss.  She has no chest pain, shortness of breath, cough, or hemoptysis.  She denies any nausea, vomiting, constipation, or diarrhea.  She has no melena or hematochezia.  She has no urinary complaints.  Patient offers no specific complaints today.  REVIEW OF SYSTEMS:   Review of Systems  Constitutional: Negative.  Negative for fever, malaise/fatigue and weight loss.  Respiratory: Negative.  Negative for cough, hemoptysis and shortness of breath.   Cardiovascular: Negative.  Negative for chest pain and leg swelling.  Gastrointestinal: Negative.  Negative for abdominal pain.  Genitourinary: Negative.  Negative for dysuria and hematuria.  Musculoskeletal: Negative.  Negative for joint pain and myalgias.  Skin: Negative.  Negative for rash.  Neurological: Negative.  Negative for dizziness, focal weakness, weakness and headaches.  Psychiatric/Behavioral: Negative.  The patient is not nervous/anxious.     As per HPI. Otherwise, a complete review of systems is negative.  PAST MEDICAL HISTORY: Past Medical History:  Diagnosis Date   Actinic keratosis 09/01/2019   right cheek   Actinic keratosis 10/20/2019   left lat shin   Adenomatous colon polyp    Anemia    Aortic valve stenosis 04/22/2021   a.) 04/22/2021: EF 55%, mild AS (MPG 12.2 mmHg)   Arthritis    Basal cell carcinoma 07/09/2018   left upper  forehead   Basal cell carcinoma 09/01/2019   nasal dorsum   Basal cell carcinoma 12/11/2022   R pretibial, [facility] 01/13/23   Basal cell carcinoma 07/06/2023   L pretibia. 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   a.) s/p PPM placement in 10/2010; PG changed 10/29/2021   Diastolic dysfunction 02/27/2016   a.) TTE 02/26/2017: EF 55%, mild LVH, mild LAE, mod RVE, triv AR/PR, mild MR, mod TR, RVSP 63.4, G1DD; b.) TTE 03/02/2017: EF >55%, mod RVE, triv AR, mild MR/PR, mod TR, RVSP 48; G1DD; c.) TTE 04/22/2021: EF 55%, mild LVH, mild MAC, mild LAE, triv PR, mild MR, mod TR, mild AS (MPG 12.2), RVSP 43, G1DD   Dysplastic nevus 10/20/2019   right sup chest/moderate, limited margins free   Dysplastic nevus 08/27/2022   right flank lateral to breast, moderate   GERD (gastroesophageal reflux disease)    Hypertension    Hypothyroidism    LBBB (left bundle branch block)    Long term current use of immunosuppressive drug    a.) MTX for scleroderma Dx   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   Pulmonary HTN (HCC) 02/27/2016   a.) TTE 12/15/2005: EF >55%, RVSP 33 mmHg; b.) TTE 02/26/2017: EF 55%, RVSP 63.4; c.) TTE 03/02/2017: EF >55%, RVSP 48; d.) TTE 04/22/2021: EF 55%, RVSP 43; e.) Tx'd with ACEi (analapril) + PDE5i (sildenafil )   Raynaud's disease    Scleroderma (HCC)    a.) FANA and  anti Ro Ab (+),  mild interstitial change on CT, normal barium swallow, sclerodactyly with contracture, iflammatory arthritis, mild pulmonary hypertension; b.) on long term Tx using oral MTX   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   right cheek Moh's 01/08/2021   SSS (sick sinus syndrome) (HCC) 10/2010   a.) s/p PPM placement in 10/2010; PG changed 10/29/2021   Vitamin B 12 deficiency    Vitamin D deficiency     PAST SURGICAL HISTORY: Past Surgical History:  Procedure Laterality Date    ABDOMINAL HYSTERECTOMY N/A    COLONOSCOPY N/A 09/22/2016   Procedure: COLONOSCOPY;  Surgeon: Lamar ONEIDA Holmes, MD;  Location: [facility] ENDOSCOPY;  Service: Endoscopy;  Laterality: N/A;  Pacemaker; Diffuse Scleroderma   COLONOSCOPY N/A 12/20/2001   COLONOSCOPY N/A 07/12/2004   COLONOSCOPY N/A 07/04/2011   ESOPHAGOGASTRODUODENOSCOPY N/A 08/11/2007   EXCISION PARTIAL PHALANX Left 06/06/2022   Procedure: EXCISION PARTIAL PHALANX -[id number];  Surgeon: Neill Boas, DPM;  Location: [facility] ORS;  Service: Podiatry;  Laterality: Left;   PACEMAKER INSERTION N/A 10/2010   PPM GENERATOR CHANGEOUT N/A 10/29/2021   Procedure: PPM GENERATOR CHANGEOUT;  Surgeon: Ammon Blunt, MD;  Location: [facility] INVASIVE CV LAB;  Service: Cardiovascular;  Laterality: N/A;   TUBAL LIGATION Bilateral 1979    FAMILY HISTORY: Family History  Problem Relation Age of Onset   Breast cancer Neg Hx     ADVANCED DIRECTIVES (Y/N):  N  HEALTH MAINTENANCE: Social History   Tobacco Use   Smoking status: Never   Smokeless tobacco: Never  Vaping Use   Vaping status: Never Used  Substance Use Topics   Alcohol use: Never   Drug use: Never     Colonoscopy:  PAP:  Bone density:  Lipid panel:  Allergies  Allergen Reactions   Penicillins Rash   Sulfa Antibiotics Rash    Current Outpatient Medications  Medication Sig Dispense Refill   amLODipine (NORVASC) 10 MG tablet Take 10 mg by mouth every morning.     aspirin EC 81 MG tablet Take 81 mg by mouth daily.     calcium carbonate (TUMS - DOSED IN MG ELEMENTAL CALCIUM) 500 MG chewable tablet Chew 500 mg by mouth daily as needed for indigestion or heartburn.     Calcium Citrate-Vitamin D (CALCIUM + D PO) Take 1 tablet by mouth daily. 600 mg     cholecalciferol (VITAMIN D3) 25 MCG (1000 UNIT) tablet Take 1,000 Units by mouth daily.     enalapril (VASOTEC) 2.5 MG tablet Take 2.5 mg by mouth every morning.     fluorouracil  (EFUDEX ) 5 % cream Apply topically 2 (two) times daily.  For 1 week to nose as directed 40 g 0   folic acid (FOLVITE) 800 MCG tablet Take 800 mcg by mouth daily.     hydrALAZINE (APRESOLINE) 50 MG tablet Take 50 mg by mouth 3 (three) times daily.     hydroxychloroquine (PLAQUENIL) 200 MG tablet Take by mouth.     levothyroxine (SYNTHROID, LEVOTHROID) 75 MCG tablet Take 75 mcg by mouth daily before breakfast.     methotrexate (RHEUMATREX) 2.5 MG tablet Take 15 mg by mouth every Monday.     Multiple Vitamin (MULTIVITAMIN WITH MINERALS) TABS tablet Take 1 tablet by mouth daily. Centrum Silver     omeprazole (PRILOSEC) 20 MG capsule Take 20 mg by mouth every morning.     sildenafil  (REVATIO ) 20 MG tablet TAKE ONE-HALF TABLET BY MOUTH THREE TIMES DAILY 135 tablet 0   vitamin B-12 (CYANOCOBALAMIN) 1000 MCG tablet Take 1,000 mcg by  mouth daily.     ibuprofen (ADVIL) 200 MG tablet Take 600 mg by mouth daily. (Patient not taking: Reported on 07/14/2024)     No current facility-administered medications for this visit.    OBJECTIVE: Vitals:   07/14/24 1056  BP: 134/68  Pulse: 79  Resp: 18  Temp: (!) 97.3 F (36.3 C)  SpO2: 99%      Body mass index is 21.4 kg/m.    ECOG FS:0 - Asymptomatic  General: Well-developed, well-nourished, no acute distress. Eyes: Pink conjunctiva, anicteric sclera. HEENT: Normocephalic, moist mucous membranes. Lungs: No audible wheezing or coughing. Heart: Regular rate and rhythm. Abdomen: Soft, nontender, no obvious distention. Musculoskeletal: No edema, cyanosis, or clubbing. Neuro: Alert, answering all questions appropriately. Cranial nerves grossly intact. Skin: No rashes or petechiae noted. Psych: Normal affect.  LAB RESULTS:  Lab Results  Component Value Date   NA 138 12/24/2023   K 3.9 12/24/2023   CL 103 12/24/2023   CO2 24 12/24/2023   GLUCOSE 115 (H) 12/24/2023   BUN 19 12/24/2023   CREATININE 0.64 12/24/2023   CALCIUM 9.2 12/24/2023   GFRNONAA >60 12/24/2023    Lab Results  Component Value Date    WBC 6.4 07/07/2024   HGB 9.7 (L) 07/07/2024   HCT 30.8 (L) 07/07/2024   MCV 85.8 07/07/2024   PLT 213 07/07/2024     STUDIES: No results found.   ASSESSMENT: Anemia, unspecified.  Elevated kappa/lambda light chains.  PLAN:    Anemia, unspecified: Patient's hemoglobin has trended down to 9.7.  She has a mildly reduced ferritin level of 10, but all her other laboratory work is either negative or within normal limits.  She has been instructed to continue oral iron supplementation.  No further intervention is needed.  Patient does not require bone marrow biopsy.  Return to clinic in 6 months for repeat laboratory work and routine evaluation.   Elevated kappa/lambda light chains: Likely clinically insignificant.  Light chain ratio continues to be within normal limits.  SPEP and immunoglobulins are negative.  Patient does not require bone marrow biopsy.  Repeat light chains with next lab draw in 6 months.  If they remain unchanged, can likely discontinue monitoring. Leukocytosis: Resolved.  Peripheral blood flow cytometry is negative. Renal cyst: CT scan results from December 18, 2023 reviewed independently and report as above reporting benign left kidney cyst.  No further imaging is necessary.  Patient expressed understanding and was in agreement with this plan. She also understands that She can call clinic at any time with any questions, concerns, or complaints.    Tammy JINNY Reusing, MD   07/14/2024 11:03 AM

## 2024-07-22 DIAGNOSIS — M79675 Pain in left toe(s): Secondary | ICD-10-CM | POA: Diagnosis not present

## 2024-07-22 DIAGNOSIS — M79674 Pain in right toe(s): Secondary | ICD-10-CM | POA: Diagnosis not present

## 2024-07-22 DIAGNOSIS — B351 Tinea unguium: Secondary | ICD-10-CM | POA: Diagnosis not present

## 2024-08-01 ENCOUNTER — Other Ambulatory Visit: Payer: Self-pay | Admitting: Dermatology

## 2024-08-01 DIAGNOSIS — I7300 Raynaud's syndrome without gangrene: Secondary | ICD-10-CM

## 2024-08-04 ENCOUNTER — Telehealth: Payer: Self-pay

## 2024-08-04 NOTE — Telephone Encounter (Signed)
 Patient left voicemail stating she is waiting on refill of sildenafil .  Left message for patient to call back and advise her prescription request was forwarded to patient's rheumatologist for further refills.

## 2024-08-16 ENCOUNTER — Ambulatory Visit: Admitting: Dermatology

## 2024-08-16 ENCOUNTER — Encounter: Payer: Self-pay | Admitting: Dermatology

## 2024-08-16 DIAGNOSIS — L820 Inflamed seborrheic keratosis: Secondary | ICD-10-CM | POA: Diagnosis not present

## 2024-08-16 DIAGNOSIS — I7300 Raynaud's syndrome without gangrene: Secondary | ICD-10-CM | POA: Diagnosis not present

## 2024-08-16 DIAGNOSIS — L578 Other skin changes due to chronic exposure to nonionizing radiation: Secondary | ICD-10-CM | POA: Diagnosis not present

## 2024-08-16 DIAGNOSIS — L821 Other seborrheic keratosis: Secondary | ICD-10-CM

## 2024-08-16 DIAGNOSIS — L720 Epidermal cyst: Secondary | ICD-10-CM

## 2024-08-16 DIAGNOSIS — Z1283 Encounter for screening for malignant neoplasm of skin: Secondary | ICD-10-CM | POA: Diagnosis not present

## 2024-08-16 DIAGNOSIS — L570 Actinic keratosis: Secondary | ICD-10-CM | POA: Diagnosis not present

## 2024-08-16 DIAGNOSIS — D1801 Hemangioma of skin and subcutaneous tissue: Secondary | ICD-10-CM | POA: Diagnosis not present

## 2024-08-16 DIAGNOSIS — D229 Melanocytic nevi, unspecified: Secondary | ICD-10-CM

## 2024-08-16 DIAGNOSIS — L814 Other melanin hyperpigmentation: Secondary | ICD-10-CM | POA: Diagnosis not present

## 2024-08-16 DIAGNOSIS — W908XXA Exposure to other nonionizing radiation, initial encounter: Secondary | ICD-10-CM | POA: Diagnosis not present

## 2024-08-16 DIAGNOSIS — Z85828 Personal history of other malignant neoplasm of skin: Secondary | ICD-10-CM

## 2024-08-16 DIAGNOSIS — B351 Tinea unguium: Secondary | ICD-10-CM | POA: Diagnosis not present

## 2024-08-16 MED ORDER — FLUOROURACIL 5 % EX CREA: TOPICAL_CREAM | CUTANEOUS | 0 refills | Status: DC

## 2024-08-16 NOTE — Progress Notes (Signed)
 Follow-Up Visit   Subjective  [name] is a 78 y.o. female who presents for the following: Skin Cancer Screening and Full Body Skin Exam  The patient presents for Total-Body Skin Exam (TBSE) for skin cancer screening and mole check. The patient has spots, moles and lesions to be evaluated, some may be new or changing and the patient may have concern these could be cancer.  The following portions of the chart were reviewed this encounter and updated as appropriate: medications, allergies, medical history  Review of Systems:  No other skin or systemic complaints except as noted in HPI or Assessment and Plan.  Objective  Well appearing patient in no apparent distress; mood and affect are within normal limits.  A full examination was performed including scalp, head, eyes, ears, nose, lips, neck, chest, axillae, abdomen, back, buttocks, bilateral upper extremities, bilateral lower extremities, hands, feet, fingers, toes, fingernails, and toenails. All findings within normal limits unless otherwise noted below.   Relevant physical exam findings are noted in the Assessment and Plan. R ant thigh Verrucous papules -- Discussed viral etiology and contagion.   Assessment & Plan   SKIN CANCER SCREENING PERFORMED TODAY.  ACTINIC DAMAGE WITH PRECANCEROUS ACTINIC KERATOSES Counseling for Topical Chemotherapy Management: Patient exhibits: - Severe, confluent actinic changes with pre-cancerous actinic keratoses that is secondary to cumulative UV radiation exposure over time - Condition that is severe; chronic, not at goal. - diffuse scaly erythematous macules and papules with underlying dyspigmentation - Discussed Prescription Field Treatment topical Chemotherapy for Severe, Chronic Confluent Actinic Changes with Pre-Cancerous Actinic Keratoses Field treatment involves treatment of an entire area of skin that has confluent Actinic Changes (Sun/ Ultraviolet light damage) and PreCancerous  Actinic Keratoses by method of PhotoDynamic Therapy (PDT) and/or prescription Topical Chemotherapy agents such as 5-fluorouracil , 5-fluorouracil /calcipotriene, and/or imiquimod.  The purpose is to decrease the number of clinically evident and subclinical PreCancerous lesions to prevent progression to development of skin cancer by chemically destroying early precancer changes that may or may not be visible.  It has been shown to reduce the risk of developing skin cancer in the treated area. As a result of treatment, redness, scaling, crusting, and open sores may occur during treatment course. One or more than one of these methods may be used and may have to be used several times to control, suppress and eliminate the PreCancerous changes. Discussed treatment course, expected reaction, and possible side effects. - Recommend daily broad spectrum sunscreen SPF 30+ to sun-exposed areas, reapply every 2 hours as needed.  - Staying in the shade or wearing long sleeves, sun glasses (UVA+UVB protection) and wide brim hats (4-inch brim around the entire circumference of the hat) are also recommended. - Call for new or changing lesions. - Start Efudex  BID to the nose until reaction occurs then stop. Up to 5 weeks  LENTIGINES, SEBORRHEIC KERATOSES, HEMANGIOMAS - Benign normal skin lesions - Benign-appearing - Call for any changes  MELANOCYTIC NEVI - Tan-brown and/or pink-flesh-colored symmetric macules and papules - Benign appearing on exam today - Observation - Call clinic for new or changing moles - Recommend daily use of broad spectrum spf 30+ sunscreen to sun-exposed areas.   HISTORY OF BASAL CELL CARCINOMA OF THE SKIN. Left pretibia. Bx: 07/06/2023. Tx with [facility] [place] 07/29/2023 - No evidence of recurrence today, clear on exam - Recommend regular full body skin exams - Recommend daily broad spectrum sunscreen SPF 30+ to sun-exposed areas, reapply every 2 hours as needed.  - Call if  any new or changing lesions  are noted between office visits  HISTORY OF DYSPLASTIC NEVI No evidence of recurrence today Recommend regular full body skin exams Recommend daily broad spectrum sunscreen SPF 30+ to sun-exposed areas, reapply every 2 hours as needed.  Call if any new or changing lesions are noted between office visits  Raynaud's disease without gangrene -  Pt to follow up with rheumatologist for sildenafil  refills.  Milia - tiny firm white papules of the face - type of cyst - benign - sometimes these will clear with nightly OTC adapalene/Differin 0.1% gel or retinol. - may be extracted if symptomatic - observe  ONYCHOMYCOSIS Exam: Thickened toenails with subungal debris c/w onychomycosis on L foot  Chronic and persistent condition with duration or expected duration over one year. Condition is symptomatic/ bothersome to patient. Not currently at goal.  Treatment Plan: Pt defers treatment INFLAMED SEBORRHEIC KERATOSIS R ant thigh ISK vs wart Discussed viral / HPV (Human Papilloma Virus) etiology and risk of spread /infectivity to other areas of body as well as to other people.  Multiple treatments and methods may be required to clear warts and it is possible treatment may not be successful.  Treatment risks include discoloration; scarring and there is still potential for wart recurrence. ACTINIC KERATOSES   LENTIGINES   MULTIPLE BENIGN NEVI   ACTINIC ELASTOSIS   SEBORRHEIC KERATOSES   CHERRY ANGIOMA   MILIA   ONYCHOMYCOSIS   RAYNAUD'S DISEASE WITHOUT GANGRENE   Related Medications sildenafil  (REVATIO ) 20 MG tablet TAKE ONE-HALF TABLET BY MOUTH THREE TIMES DAILY  Return in about 3 months (around 11/15/2024) for AK follow up on the nose.  [name], CMA, am acting as scribe for Boneta Sharps, MD .  Documentation: I have reviewed the above documentation for accuracy and completeness, and I agree with the above.  Boneta Sharps, MD

## 2024-08-16 NOTE — Patient Instructions (Signed)

## 2024-08-20 DIAGNOSIS — M349 Systemic sclerosis, unspecified: Secondary | ICD-10-CM | POA: Diagnosis not present

## 2024-08-20 DIAGNOSIS — M79672 Pain in left foot: Secondary | ICD-10-CM | POA: Diagnosis not present

## 2024-09-02 DIAGNOSIS — E039 Hypothyroidism, unspecified: Secondary | ICD-10-CM | POA: Diagnosis not present

## 2024-09-02 DIAGNOSIS — Z862 Personal history of diseases of the blood and blood-forming organs and certain disorders involving the immune mechanism: Secondary | ICD-10-CM | POA: Diagnosis not present

## 2024-09-02 DIAGNOSIS — I10 Essential (primary) hypertension: Secondary | ICD-10-CM | POA: Diagnosis not present

## 2024-09-06 DIAGNOSIS — Z7960 Long term (current) use of unspecified immunomodulators and immunosuppressants: Secondary | ICD-10-CM | POA: Diagnosis not present

## 2024-09-06 DIAGNOSIS — M349 Systemic sclerosis, unspecified: Secondary | ICD-10-CM | POA: Diagnosis not present

## 2024-09-09 DIAGNOSIS — I10 Essential (primary) hypertension: Secondary | ICD-10-CM | POA: Diagnosis not present

## 2024-09-09 DIAGNOSIS — E039 Hypothyroidism, unspecified: Secondary | ICD-10-CM | POA: Diagnosis not present

## 2024-09-09 DIAGNOSIS — Z136 Encounter for screening for cardiovascular disorders: Secondary | ICD-10-CM | POA: Diagnosis not present

## 2024-09-09 DIAGNOSIS — Z0000 Encounter for general adult medical examination without abnormal findings: Secondary | ICD-10-CM | POA: Diagnosis not present

## 2024-09-09 DIAGNOSIS — Z1331 Encounter for screening for depression: Secondary | ICD-10-CM | POA: Diagnosis not present

## 2024-09-09 DIAGNOSIS — Z862 Personal history of diseases of the blood and blood-forming organs and certain disorders involving the immune mechanism: Secondary | ICD-10-CM | POA: Diagnosis not present

## 2024-09-14 ENCOUNTER — Other Ambulatory Visit: Payer: Self-pay | Admitting: Dermatology

## 2024-11-22 ENCOUNTER — Ambulatory Visit: Admitting: Dermatology

## 2024-12-05 ENCOUNTER — Encounter: Payer: Self-pay | Admitting: Dermatology

## 2024-12-05 ENCOUNTER — Ambulatory Visit: Admitting: Dermatology

## 2024-12-05 DIAGNOSIS — D485 Neoplasm of uncertain behavior of skin: Secondary | ICD-10-CM

## 2024-12-05 DIAGNOSIS — L570 Actinic keratosis: Secondary | ICD-10-CM | POA: Diagnosis not present

## 2024-12-05 DIAGNOSIS — D099 Carcinoma in situ, unspecified: Secondary | ICD-10-CM

## 2024-12-05 DIAGNOSIS — C4492 Squamous cell carcinoma of skin, unspecified: Secondary | ICD-10-CM

## 2024-12-05 DIAGNOSIS — C44329 Squamous cell carcinoma of skin of other parts of face: Secondary | ICD-10-CM

## 2024-12-05 DIAGNOSIS — L720 Epidermal cyst: Secondary | ICD-10-CM

## 2024-12-05 DIAGNOSIS — L578 Other skin changes due to chronic exposure to nonionizing radiation: Secondary | ICD-10-CM | POA: Diagnosis not present

## 2024-12-05 DIAGNOSIS — D0439 Carcinoma in situ of skin of other parts of face: Secondary | ICD-10-CM | POA: Diagnosis not present

## 2024-12-05 DIAGNOSIS — L821 Other seborrheic keratosis: Secondary | ICD-10-CM | POA: Diagnosis not present

## 2024-12-05 DIAGNOSIS — W908XXA Exposure to other nonionizing radiation, initial encounter: Secondary | ICD-10-CM

## 2024-12-05 DIAGNOSIS — Z7189 Other specified counseling: Secondary | ICD-10-CM

## 2024-12-05 HISTORY — DX: Squamous cell carcinoma of skin, unspecified: C44.92

## 2024-12-05 HISTORY — DX: Carcinoma in situ, unspecified: D09.9

## 2024-12-05 NOTE — Progress Notes (Signed)
 "  Follow-Up Visit   Subjective  [name] is a 79 y.o. female who presents for the following: 3 month AK follow up. Patient used 5FU twice daily to nose for about 3 weeks before scabs developed.   The following portions of the chart were reviewed this encounter and updated as appropriate: medications, allergies, medical history  Review of Systems:  No other skin or systemic complaints except as noted in HPI or Assessment and Plan.  Objective  Well appearing patient in no apparent distress; mood and affect are within normal limits.   A focused examination was performed of the following areas: face  Relevant exam findings are noted in the Assessment and Plan.  right medial forehead Pink keratotic papule 5 mm  left lateral brow Pink keratotic papule 7 mm   Assessment & Plan   EPIDERMAL INCLUSION CYST Exam: Subcutaneous nodule at right antihelix  Benign-appearing. Exam most consistent with an epidermal inclusion cyst. Discussed that a cyst is a benign growth that can grow over time and sometimes get irritated or inflamed. Recommend observation if it is not bothersome. Discussed option of surgical excision to remove it if it is growing, symptomatic, or other changes noted. Please call for new or changing lesions so they can be evaluated.  SEBORRHEIC KERATOSIS - Stuck-on, waxy, tan-brown papules and/or plaques  - Benign-appearing - Discussed benign etiology and prognosis. - Observe - Call for any changes  ACTINIC DAMAGE WITH PRECANCEROUS ACTINIC KERATOSES Counseling for Topical Chemotherapy Management: Patient exhibits: - Severe, confluent actinic changes with pre-cancerous actinic keratoses that is secondary to cumulative UV radiation exposure over time - Condition that is severe; chronic, not at goal. - diffuse scaly erythematous macules and papules with underlying dyspigmentation - Discussed Prescription Field Treatment topical Chemotherapy for Severe, Chronic  Confluent Actinic Changes with Pre-Cancerous Actinic Keratoses Field treatment involves treatment of an entire area of skin that has confluent Actinic Changes (Sun/ Ultraviolet light damage) and PreCancerous Actinic Keratoses by method of PhotoDynamic Therapy (PDT) and/or prescription Topical Chemotherapy agents such as 5-fluorouracil , 5-fluorouracil /calcipotriene, and/or imiquimod.  The purpose is to decrease the number of clinically evident and subclinical PreCancerous lesions to prevent progression to development of skin cancer by chemically destroying early precancer changes that may or may not be visible.  It has been shown to reduce the risk of developing skin cancer in the treated area. As a result of treatment, redness, scaling, crusting, and open sores may occur during treatment course. One or more than one of these methods may be used and may have to be used several times to control, suppress and eliminate the PreCancerous changes. Discussed treatment course, expected reaction, and possible side effects. - Recommend daily broad spectrum sunscreen SPF 30+ to sun-exposed areas, reapply every 2 hours as needed.  - Staying in the shade or wearing long sleeves, sun glasses (UVA+UVB protection) and wide brim hats (4-inch brim around the entire circumference of the hat) are also recommended. - Call for new or changing lesions. - Start Efudex  BID to the temples and cheeks until reaction occurs then stop. Up to 5 weeks  NEOPLASM OF UNCERTAIN BEHAVIOR OF SKIN (2) right medial forehead - Skin / nail biopsy Type of biopsy: tangential   Informed consent: discussed and consent obtained   Timeout: patient name, date of birth, surgical site, and procedure verified   Procedure prep:  Patient was prepped and draped in usual sterile fashion Prep type:  Isopropyl alcohol Anesthesia: the lesion was anesthetized in a standard  fashion   Anesthetic:  1% lidocaine  w/ epinephrine 1-100,000 buffered w/ 8.4%  NaHCO3 Instrument used: DermaBlade   Hemostasis achieved with: pressure and aluminum chloride   Outcome: patient tolerated procedure well   Post-procedure details: sterile dressing applied and wound care instructions given   Dressing type: bandage and petrolatum    Specimen 1 - Surgical pathology Differential Diagnosis: r/o SCC  Check Margins: No left lateral brow - Skin / nail biopsy Type of biopsy: tangential   Informed consent: discussed and consent obtained   Timeout: patient name, date of birth, surgical site, and procedure verified   Procedure prep:  Patient was prepped and draped in usual sterile fashion Prep type:  Isopropyl alcohol Anesthesia: the lesion was anesthetized in a standard fashion   Anesthetic:  1% lidocaine  w/ epinephrine 1-100,000 buffered w/ 8.4% NaHCO3 Instrument used: DermaBlade   Hemostasis achieved with: pressure and aluminum chloride   Outcome: patient tolerated procedure well   Post-procedure details: sterile dressing applied and wound care instructions given   Dressing type: bandage and petrolatum    Specimen 2 - Surgical pathology Differential Diagnosis: r/o SCC  Check Margins: No ACTINIC ELASTOSIS   ACTINIC KERATOSES   EIC (EPIDERMAL INCLUSION CYST)   SEBORRHEIC KERATOSES    Return in about 3 months (around 03/05/2025) for TBSE, with Dr. Claudene, [facility], [facility].  LILLETTE Lonell Drones, RMA, am acting as scribe for Boneta Claudene, MD .   Documentation: I have reviewed the above documentation for accuracy and completeness, and I agree with the above.  Boneta Claudene, MD    "

## 2024-12-05 NOTE — Patient Instructions (Addendum)
 - Start Efudex  BID to the temples and cheeks until reaction occurs then stop. Up to 5 weeks   Biopsy Wound Care Instructions  Leave the original bandage on for 24 hours if possible.  If the bandage becomes soaked or soiled before that time, it is OK to remove it and examine the wound.  A small amount of post-operative bleeding is normal.  If excessive bleeding occurs, remove the bandage, place gauze over the site and apply continuous pressure (no peeking) over the area for 30 minutes. If this does not work, please call our clinic as soon as possible or page your doctor if it is after hours.   Once a day, cleanse the wound with soap and water. It is fine to shower. After washing, apply petroleum jelly (Vaseline) or an antibiotic ointment if your doctor prescribed one for you, followed by a bandage.    For best healing, the wound should be covered with a layer of ointment at all times. If you are not able to keep the area covered with a bandage to hold the ointment in place, this may mean re-applying the ointment several times a day.  Continue this wound care until the wound has healed and is no longer open.   Itching and mild discomfort is normal during the healing process. However, if you develop pain or severe itching, please call our office.   If you have any discomfort, you can take Tylenol  (acetaminophen ) or ibuprofen as directed on the bottle. (Please do not take these if you have an allergy to them or cannot take them for another reason).  Some redness, tenderness and white or yellow material in the wound is normal healing.  If the area becomes very sore and red, or develops a thick yellow-green material (pus), it may be infected; please notify us .    If you have stitches, return to clinic as directed to have the stitches removed. You will continue wound care for 2-3 days after the stitches are removed.   Wound healing continues for up to one year following surgery. It is not unusual to  experience pain in the scar from time to time during the interval.  If the pain becomes severe or the scar thickens, you should notify the office.    A slight amount of redness in a scar is expected for the first six months.  After six months, the redness will fade and the scar will soften and fade.  The color difference becomes less noticeable with time.  If there are any problems, return for a post-op surgery check at your earliest convenience.  To improve the appearance of the scar, you can use silicone scar gel, cream, or sheets (such as Mederma or Serica) every night for up to one year. These are available over the counter (without a prescription).  Please call our office at [phone] for any questions or concerns.    Due to recent changes in healthcare laws, you may see results of your pathology and/or laboratory studies on MyChart before the doctors have had a chance to review them. We understand that in some cases there may be results that are confusing or concerning to you. Please understand that not all results are received at the same time and often the doctors may need to interpret multiple results in order to provide you with the best plan of care or course of treatment. Therefore, we ask that you please give us  2 business days to thoroughly review all your results before  contacting the office for clarification. Should we see a critical lab result, you will be contacted sooner.   If You Need Anything After Your Visit  If you have any questions or concerns for your doctor, please call our main line at [phone] and press option 4 to reach your doctor's medical assistant. If no one answers, please leave a voicemail as directed and we will return your call as soon as possible. Messages left after 4 pm will be answered the following business day.   You may also send us  a message via MyChart. We typically respond to MyChart messages within 1-2 business days.  For prescription  refills, please ask your pharmacy to contact our office. Our fax number is [phone].  If you have an urgent issue when the clinic is closed that cannot wait until the next business day, you can page your doctor at the number below.    Please note that while we do our best to be available for urgent issues outside of office hours, we are not available 24/7.   If you have an urgent issue and are unable to reach us , you may choose to seek medical care at your doctor's office, retail clinic, urgent care center, or emergency room.  If you have a medical emergency, please immediately call 911 or go to the emergency department.  Pager Numbers  - Dr. Hester: [phone]  - Dr. Jackquline: [phone]  - Dr. Claudene: [phone]   - Dr. Raymund: [phone]  In the event of inclement weather, please call our main line at [phone] for an update on the status of any delays or closures.  Dermatology Medication Tips: Please keep the boxes that topical medications come in in order to help keep track of the instructions about where and how to use these. Pharmacies typically print the medication instructions only on the boxes and not directly on the medication tubes.   If your medication is too expensive, please contact our office at [phone] option 4 or send us  a message through MyChart.   We are unable to tell what your co-pay for medications will be in advance as this is different depending on your insurance coverage. However, we may be able to find a substitute medication at lower cost or fill out paperwork to get insurance to cover a needed medication.   If a prior authorization is required to get your medication covered by your insurance company, please allow us  1-2 business days to complete this process.  Drug prices often vary depending on where the prescription is filled and some pharmacies may offer cheaper prices.  The website www.goodrx.com contains coupons for medications  through different pharmacies. The prices here do not account for what the cost may be with help from insurance (it may be cheaper with your insurance), but the website can give you the price if you did not use any insurance.  - You can print the associated coupon and take it with your prescription to the pharmacy.  - You may also stop by our office during regular business hours and pick up a GoodRx coupon card.  - If you need your prescription sent electronically to a different pharmacy, notify our office through [facility] or by phone at [phone] option 4.     Si Usted Necesita Algo Despus de Su Visita  Tambin puede enviarnos un mensaje a travs de Clinical Cytogeneticist. Por lo general respondemos a los mensajes de MyChart en el transcurso de 1 a 2 das hbiles.  Para  renovar recetas, por favor pida a su farmacia que se ponga en contacto con nuestra oficina. Randi lakes de fax es [place] [phone].  Si tiene un asunto urgente cuando la clnica est cerrada y que no puede esperar hasta el siguiente da hbil, puede llamar/localizar a su doctor(a) al nmero que aparece a continuacin.   Por favor, tenga en cuenta que aunque hacemos todo lo posible para estar disponibles para asuntos urgentes fuera del horario de [place], no estamos disponibles las 24 horas del da, los 7 [address]  [address] de la [place].   Si tiene un problema urgente y no puede comunicarse con nosotros, puede optar por buscar atencin mdica  en el consultorio de su doctor(a), en una clnica privada, en un centro de atencin urgente o en una sala de emergencias.  Si tiene engineer, drilling, por favor llame inmediatamente al 911 o vaya a la sala de emergencias.  Nmeros de bper  - Dr. Hester: [phone]  - Dra. Jackquline: [phone]  - Dr. Claudene: [phone]  - Dra. Kitts: [phone]  En caso de inclemencias del [place], por favor llame a nuestra lnea principal al [phone] para una actualizacin sobre el estado de  cualquier retraso o cierre.  Consejos para la medicacin en dermatologa: Por favor, guarde las cajas en las que vienen los medicamentos de uso tpico para ayudarle a seguir las instrucciones sobre dnde y cmo usarlos. Las farmacias generalmente imprimen las instrucciones del medicamento slo en las cajas y no directamente en los tubos del [place].   Si su medicamento es muy caro, por favor, pngase en contacto con landry rieger llamando al [phone] y presione la opcin 4 o envenos un mensaje a travs de Clinical Cytogeneticist.   No podemos decirle cul ser su copago por los medicamentos por adelantado ya que esto es diferente dependiendo de la cobertura de su seguro. Sin embargo, es posible que podamos encontrar un medicamento sustituto a audiological scientist un formulario para que el seguro cubra el medicamento que se considera necesario.   Si se requiere una autorizacin previa para que su compaa de seguros cubra su medicamento, por favor permtanos de 1 a 2 das hbiles para completar este proceso.  Los precios de los medicamentos varan con frecuencia dependiendo del environmental consultant de dnde se surte la receta y alguna farmacias pueden ofrecer precios ms baratos.  El sitio web www.goodrx.com tiene cupones para medicamentos de health and safety inspector. Los precios aqu no tienen en cuenta lo que podra costar con la ayuda del seguro (puede ser ms barato con su seguro), pero el sitio web puede darle el precio si no utiliz tourist information centre manager.  - Puede imprimir el cupn correspondiente y llevarlo con su receta a la farmacia.  - Tambin puede pasar por nuestra oficina durante el horario de atencin regular y education officer, museum una tarjeta de cupones de GoodRx.  - Si necesita que su receta se enve electrnicamente a una farmacia diferente, informe a nuestra oficina a travs de MyChart de [place] o por telfono llamando al [phone] y presione la opcin 4.

## 2024-12-08 LAB — SURGICAL PATHOLOGY

## 2024-12-12 ENCOUNTER — Ambulatory Visit: Payer: Self-pay | Admitting: Dermatology

## 2024-12-13 ENCOUNTER — Telehealth: Payer: Self-pay

## 2024-12-13 DIAGNOSIS — C4492 Squamous cell carcinoma of skin, unspecified: Secondary | ICD-10-CM

## 2024-12-13 NOTE — Telephone Encounter (Signed)
 Reviewed pathology results with patient. 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 Referral sent to [facility] Dr. Gregorio for Mohs per pt preference. If not covered, will send to [place]. Lonell RAMAN., RMA

## 2024-12-15 ENCOUNTER — Other Ambulatory Visit: Payer: Self-pay

## 2024-12-15 DIAGNOSIS — C4492 Squamous cell carcinoma of skin, unspecified: Secondary | ICD-10-CM

## 2025-01-13 ENCOUNTER — Other Ambulatory Visit

## 2025-01-27 ENCOUNTER — Ambulatory Visit: Admitting: Oncology

## 2025-01-31 ENCOUNTER — Inpatient Hospital Stay: Admitting: Oncology

## 2025-02-08 ENCOUNTER — Encounter: Admitting: Dermatology

## 2025-03-06 ENCOUNTER — Ambulatory Visit: Admitting: Dermatology
